# Patient Record
Sex: Female | Born: 1990 | Race: White | Hispanic: No | Marital: Single | Smoking: Former smoker
Health system: Southern US, Community
[De-identification: ages and names within clinical notes are randomized; demographics above are authoritative.]

## PROBLEM LIST (undated history)

## (undated) ENCOUNTER — Inpatient Hospital Stay (HOSPITAL_COMMUNITY): Payer: Self-pay

## (undated) DIAGNOSIS — J309 Allergic rhinitis, unspecified: Secondary | ICD-10-CM

## (undated) DIAGNOSIS — J45909 Unspecified asthma, uncomplicated: Secondary | ICD-10-CM

## (undated) DIAGNOSIS — O039 Complete or unspecified spontaneous abortion without complication: Secondary | ICD-10-CM

## (undated) DIAGNOSIS — F32A Depression, unspecified: Secondary | ICD-10-CM

## (undated) DIAGNOSIS — F419 Anxiety disorder, unspecified: Secondary | ICD-10-CM

## (undated) DIAGNOSIS — F329 Major depressive disorder, single episode, unspecified: Secondary | ICD-10-CM

## (undated) DIAGNOSIS — R569 Unspecified convulsions: Secondary | ICD-10-CM

## (undated) HISTORY — DX: Depression, unspecified: F32.A

## (undated) HISTORY — DX: Unspecified convulsions: R56.9

## (undated) HISTORY — DX: Major depressive disorder, single episode, unspecified: F32.9

## (undated) HISTORY — DX: Complete or unspecified spontaneous abortion without complication: O03.9

## (undated) HISTORY — DX: Anxiety disorder, unspecified: F41.9

## (undated) HISTORY — DX: Unspecified asthma, uncomplicated: J45.909

## (undated) HISTORY — PX: DILATION AND CURETTAGE OF UTERUS: SHX78

## (undated) HISTORY — DX: Allergic rhinitis, unspecified: J30.9

---
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
## 2012-09-11 ENCOUNTER — Ambulatory Visit (INDEPENDENT_AMBULATORY_CARE_PROVIDER_SITE_OTHER): Payer: PRIVATE HEALTH INSURANCE | Admitting: Emergency Medicine

## 2012-09-11 VITALS — BP 110/72 | HR 88 | Temp 98.3°F | Resp 18 | Ht 63.0 in | Wt 128.0 lb

## 2012-09-11 DIAGNOSIS — K047 Periapical abscess without sinus: Secondary | ICD-10-CM

## 2012-09-11 DIAGNOSIS — K0889 Other specified disorders of teeth and supporting structures: Secondary | ICD-10-CM

## 2012-09-11 DIAGNOSIS — K089 Disorder of teeth and supporting structures, unspecified: Secondary | ICD-10-CM

## 2012-09-11 MED ORDER — CLINDAMYCIN HCL 300 MG PO CAPS: 300.0000 mg | ORAL_CAPSULE | Freq: Three times a day (TID) | ORAL | Status: DC

## 2012-09-11 MED ORDER — MELOXICAM 7.5 MG PO TABS: ORAL_TABLET | ORAL | Status: DC

## 2012-09-11 NOTE — Progress Notes (Signed)
  Subjective:    Patient ID: Stephanie Yu, female    DOB: 08-Aug-1990, 22 y.o.   MRN: [id number]  HPI 22 year old female presents with toothache right sided pain and swelling-back lower molar Left sided pain and swelling where she chipped a tooth 6 months ago Still has wisdom teeth.     Review of Systems     Objective:   Physical Exam patient appears to have significant pain in her mouth. The left lower second molar is broken off at the gumline with swelling of the dome the right second lower premolar is broken off at the gum with significant swelling. There is no significant cervical adenopathy.        Assessment & Plan:  We'll treat with Cleocin Mobic for pain she was given the number of Dr. Talbert Cage to see if he can get her worked in or she is going to try a different dentist to see her as soon as possible.

## 2012-09-11 NOTE — Patient Instructions (Signed)
Toothache  Toothaches are usually caused by tooth decay (cavity). However, other causes of toothache include:  · Gum disease.  · Cracked tooth.  · Cracked filling.  · Injury.  · Jaw problem (temporo mandibular joint or TMJ disorder).  · Tooth abscess.  · Root sensitivity.  · Grinding.  · Eruption problems.  Swelling and redness around a painful tooth often means you have a dental abscess.  Pain medicine and antibiotics can help reduce symptoms, but you will need to see a dentist within the next few days to have your problem properly evaluated and treated. If tooth decay is the problem, you may need a filling or root canal to save your tooth. If the problem is more severe, your tooth may need to be pulled.  SEEK IMMEDIATE MEDICAL CARE IF:  · You cannot swallow.  · You develop severe swelling, increased redness, or increased pain in your mouth or face.  · You have a fever.  · You cannot open your mouth adequately.  Document Released: 03/18/2004 Document Revised: 05/03/2011 Document Reviewed: 05/08/2009  ExitCare® Patient Information ©2014 [employer].

## 2014-01-23 ENCOUNTER — Emergency Department (HOSPITAL_BASED_OUTPATIENT_CLINIC_OR_DEPARTMENT_OTHER): Payer: No Typology Code available for payment source

## 2014-01-23 ENCOUNTER — Encounter (HOSPITAL_BASED_OUTPATIENT_CLINIC_OR_DEPARTMENT_OTHER): Payer: Self-pay | Admitting: *Deleted

## 2014-01-23 ENCOUNTER — Emergency Department (HOSPITAL_BASED_OUTPATIENT_CLINIC_OR_DEPARTMENT_OTHER)
Admission: EM | Admit: 2014-01-23 | Discharge: 2014-01-23 | Disposition: A | Discharge status: Home / Self Care | Payer: No Typology Code available for payment source | Attending: Emergency Medicine | Admitting: Emergency Medicine

## 2014-01-23 DIAGNOSIS — R059 Cough, unspecified: Secondary | ICD-10-CM

## 2014-01-23 DIAGNOSIS — Z792 Long term (current) use of antibiotics: Secondary | ICD-10-CM | POA: Diagnosis not present

## 2014-01-23 DIAGNOSIS — R000 Tachycardia, unspecified: Secondary | ICD-10-CM | POA: Insufficient documentation

## 2014-01-23 DIAGNOSIS — J0110 Acute frontal sinusitis, unspecified: Secondary | ICD-10-CM | POA: Insufficient documentation

## 2014-01-23 DIAGNOSIS — J069 Acute upper respiratory infection, unspecified: Secondary | ICD-10-CM | POA: Diagnosis not present

## 2014-01-23 DIAGNOSIS — Z7952 Long term (current) use of systemic steroids: Secondary | ICD-10-CM | POA: Diagnosis not present

## 2014-01-23 DIAGNOSIS — Z79899 Other long term (current) drug therapy: Secondary | ICD-10-CM | POA: Insufficient documentation

## 2014-01-23 DIAGNOSIS — R05 Cough: Secondary | ICD-10-CM | POA: Diagnosis present

## 2014-01-23 DIAGNOSIS — M791 Myalgia: Secondary | ICD-10-CM | POA: Diagnosis not present

## 2014-01-23 DIAGNOSIS — F419 Anxiety disorder, unspecified: Secondary | ICD-10-CM | POA: Diagnosis not present

## 2014-01-23 IMAGING — CR DG CHEST 2V
2 series · 2 of 2 positions shown · non-contrast
Comparison: None.

CLINICAL DATA: Cough and chest congestion.  Initial encounter.

EXAM:
CHEST  2 VIEW

[w chest pa]
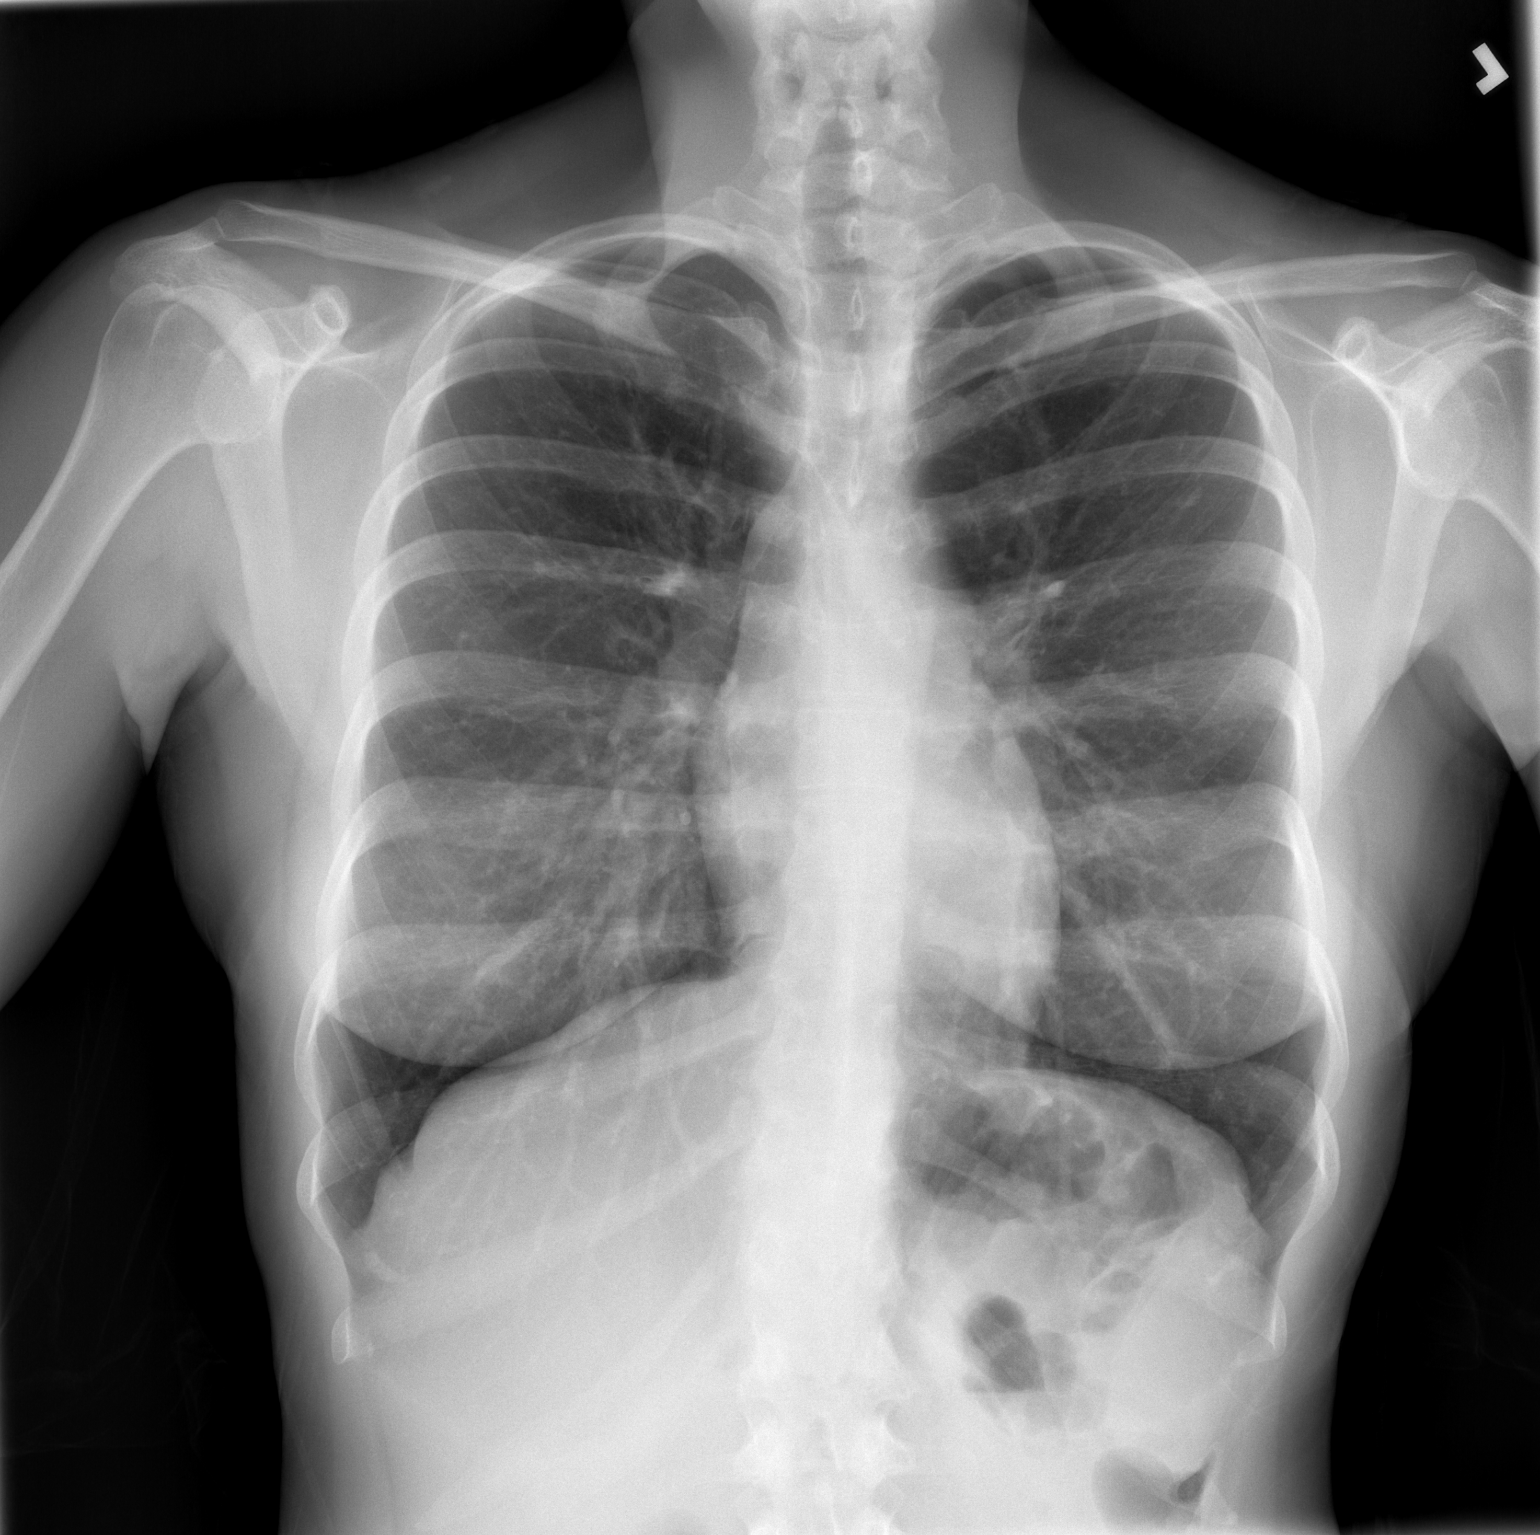

[w chest lat]
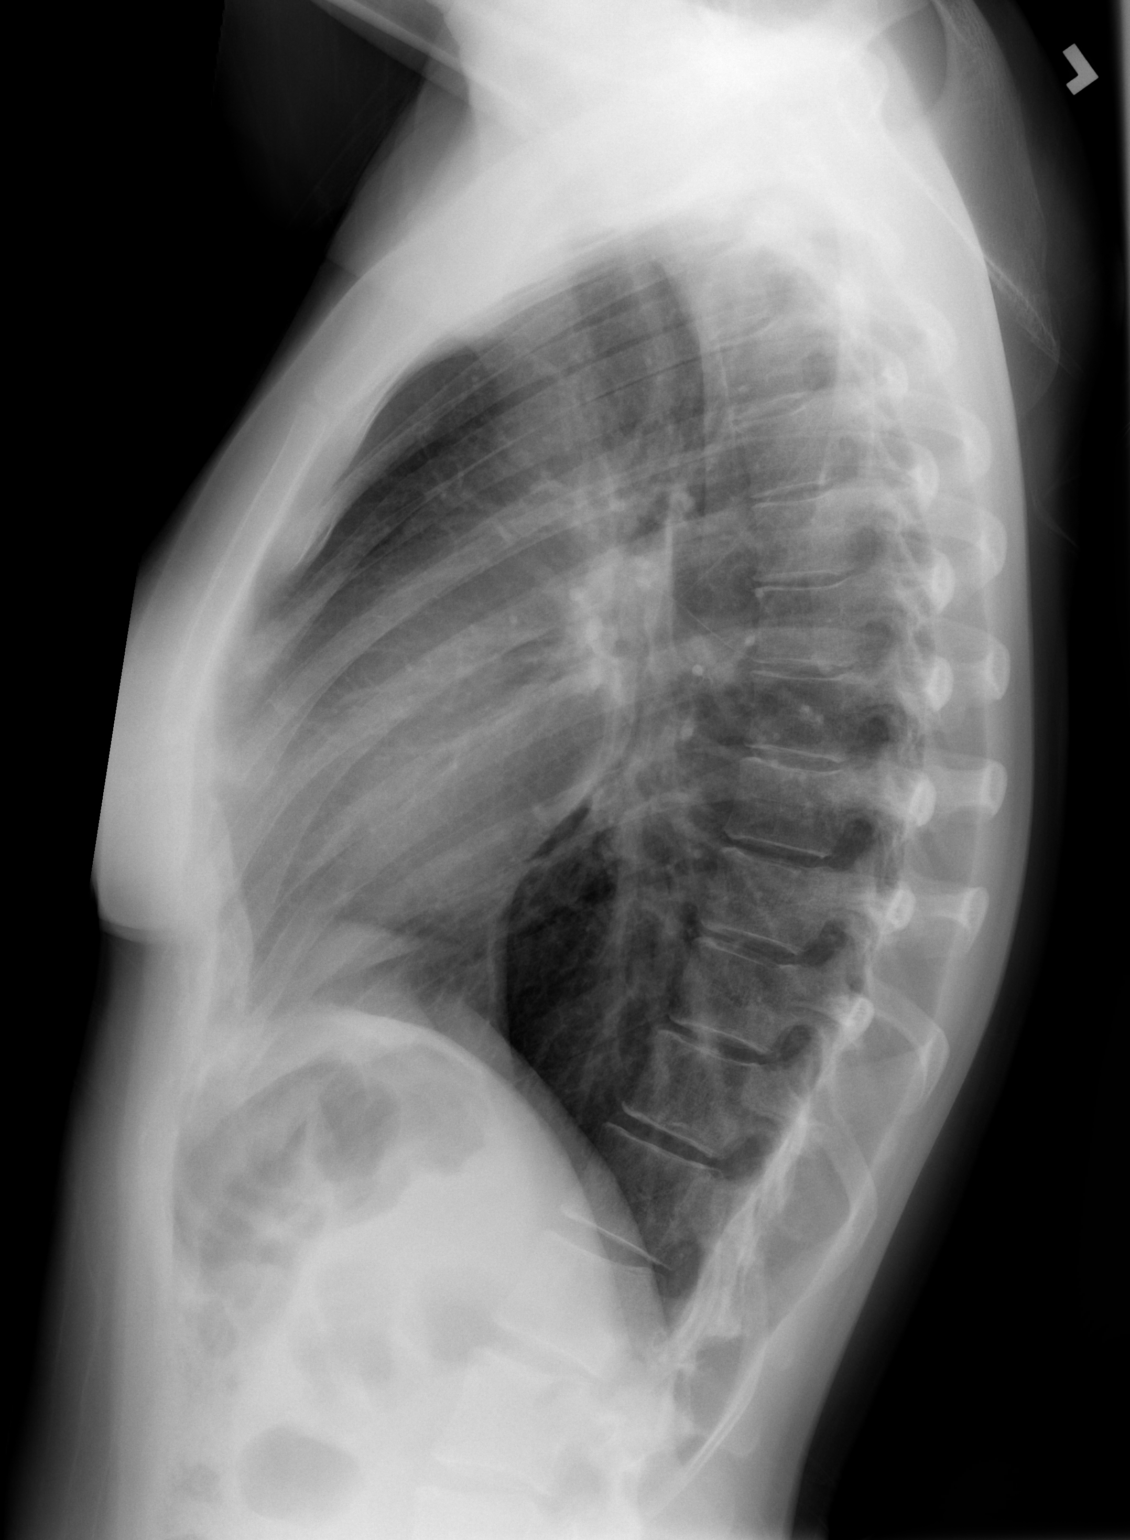

[2 of 2 positions shown; findings below may reference images not displayed]

FINDINGS: The cardiomediastinal silhouette is unremarkable.

There is no evidence of focal airspace disease, pulmonary edema,
suspicious pulmonary nodule/mass, pleural effusion, or pneumothorax.
No acute bony abnormalities are identified.
IMPRESSION: No active cardiopulmonary disease.

## 2014-01-23 MED ORDER — IBUPROFEN 400 MG PO TABS
600.0000 mg | ORAL_TABLET | Freq: Once | ORAL | Status: AC
  Administered 2014-01-23: 600 mg via ORAL
  Filled 2014-01-23 (×2): qty 1

## 2014-01-23 MED ORDER — IBUPROFEN 600 MG PO TABS: 600.0000 mg | ORAL_TABLET | Freq: Four times a day (QID) | ORAL | Status: DC | PRN

## 2014-01-23 MED ORDER — OXYMETAZOLINE HCL 0.05 % NA SOLN
1.0000 | Freq: Once | NASAL | Status: AC
  Administered 2014-01-23: 1 via NASAL
  Filled 2014-01-23: qty 15

## 2014-01-23 MED ORDER — LORATADINE 10 MG PO TABS: 10.0000 mg | ORAL_TABLET | Freq: Every day | ORAL | Status: DC

## 2014-01-23 MED ORDER — MOMETASONE FUROATE 50 MCG/ACT NA SUSP: 2.0000 | Freq: Every day | NASAL | Status: DC

## 2014-01-23 MED ORDER — LORATADINE 10 MG PO TABS
10.0000 mg | ORAL_TABLET | Freq: Once | ORAL | Status: AC
  Administered 2014-01-23: 10 mg via ORAL
  Filled 2014-01-23: qty 1

## 2014-01-23 MED ORDER — AZITHROMYCIN 250 MG PO TABS: ORAL_TABLET | ORAL | Status: DC

## 2014-01-23 NOTE — ED Notes (Signed)
Pt c/o URi symptoms x 4 days 

## 2014-01-23 NOTE — ED Provider Notes (Signed)
CSN: [id number]     Arrival date & time 01/23/14  0014 History   First MD Initiated Contact with Patient 01/23/14 0029     Chief Complaint  Patient presents with  . URI     (Consider location/radiation/quality/duration/timing/severity/associated sxs/prior Treatment) HPI Patient presents with 4 days of nasal congestion, sinus pressure, sore throat, nonproductive cough, subjective fevers and chills. She admits to diffuse myalgias. No known sick contacts. No neck pain or stiffness. No nausea, vomiting or diarrhea. Patient denies chest pain. Past Medical History  Diagnosis Date  . Anxiety    History reviewed. No pertinent past surgical history. Family History  Problem Relation Age of Onset  . Hypertension Mother   . Hypertension Father   . Diabetes Maternal Grandmother    History  Substance Use Topics  . Smoking status: Never Smoker   . Smokeless tobacco: Not on file  . Alcohol Use: No   OB History    No data available     Review of Systems  Constitutional: Positive for fever, chills and fatigue.  HENT: Positive for congestion, postnasal drip, rhinorrhea, sinus pressure, sore throat and voice change. Negative for trouble swallowing.   Eyes: Negative for visual disturbance.  Respiratory: Positive for cough. Negative for shortness of breath and wheezing.   Cardiovascular: Negative for chest pain, palpitations and leg swelling.  Gastrointestinal: Negative for nausea, vomiting, abdominal pain, diarrhea and constipation.  Musculoskeletal: Positive for myalgias. Negative for back pain, neck pain and neck stiffness.  Skin: Negative for rash and wound.  Neurological: Negative for dizziness, weakness, light-headedness, numbness and headaches.  All other systems reviewed and are negative.     Allergies  Augmentin  Home Medications   Prior to Admission medications   Medication Sig Start Date End Date Taking? Authorizing Provider  alprazolam Duanne Moron) 2 MG tablet Take 2 mg by  mouth 2 times daily at 12 noon and 4 pm.    Historical Provider, MD  azithromycin (ZITHROMAX Z-PAK) 250 MG tablet 2 po day one, then 1 daily x 4 days 01/23/14   Julianne Rice, MD  ibuprofen (ADVIL,MOTRIN) 600 MG tablet Take 1 tablet (600 mg total) by mouth every 6 (six) hours as needed. 01/23/14   Julianne Rice, MD  loratadine (CLARITIN) 10 MG tablet Take 1 tablet (10 mg total) by mouth daily. 01/23/14   Julianne Rice, MD  mometasone (NASONEX) 50 MCG/ACT nasal spray Place 2 sprays into the nose daily. 01/23/14   Julianne Rice, MD   BP 131/75 mmHg  Pulse 70  Temp(Src) 99.1 F (37.3 C) (Oral)  Resp 18  Ht 5\' 2"  (1.575 m)  Wt 121 lb (54.885 kg)  BMI 22.13 kg/m2  SpO2 99%  LMP 01/01/2014 Physical Exam  Constitutional: She is oriented to person, place, and time. She appears well-developed and well-nourished. No distress.  HENT:  Head: Normocephalic and atraumatic.  Mouth/Throat: Oropharynx is clear and moist. No oropharyngeal exudate.  Tenderness to percussion over the frontal and maxillary sinuses bilaterally. Patient has bilateral nasal mucosal edema. Mildly erythematous oropharynx without exudates. Uvula is midline.  Eyes: EOM are normal. Pupils are equal, round, and reactive to light.  Neck: Normal range of motion. Neck supple. No JVD present. No tracheal deviation present. No thyromegaly present.  No meningismus  Cardiovascular: Regular rhythm.   Tachycardia  Pulmonary/Chest: Effort normal and breath sounds normal. No respiratory distress. She has no wheezes. She has no rales. She exhibits no tenderness.  Abdominal: Soft. Bowel sounds are normal. She exhibits no distension  and no mass. There is no tenderness. There is no rebound and no guarding.  Musculoskeletal: Normal range of motion. She exhibits no edema or tenderness.  Lymphadenopathy:    She has no cervical adenopathy.  Neurological: She is alert and oriented to person, place, and time.  Patient is alert and oriented x3  with clear, goal oriented speech. Patient has 5/5 motor in all extremities. Sensation is intact to light touch. Patient has a normal gait and walks without assistance.  Skin: Skin is warm and dry. No rash noted. No erythema.  Psychiatric: Her behavior is normal.  Anxious.  Nursing note and vitals reviewed.   ED Course  Procedures (including critical care time) Labs Review Labs Reviewed - No data to display  Imaging Review Dg Chest 2 View  01/23/2014   CLINICAL DATA:  Cough and chest congestion.  Initial encounter.  EXAM: CHEST  2 VIEW  COMPARISON:  None.  FINDINGS: The cardiomediastinal silhouette is unremarkable.  There is no evidence of focal airspace disease, pulmonary edema, suspicious pulmonary nodule/mass, pleural effusion, or pneumothorax. No acute bony abnormalities are identified.  IMPRESSION: No active cardiopulmonary disease.   Electronically Signed   By: Hassan Rowan M.D.   On: 01/23/2014 02:23     EKG Interpretation None      MDM   Final diagnoses:  Cough  URI (upper respiratory infection)  Acute frontal sinusitis, recurrence not specified    Clinically the patient has URI with probable sinusitis. She is tachycardic on exam but appears anxious. We'll get a chest x-ray to rule out pneumonia and reevaluate heart rate.    Julianne Rice, MD 01/23/14 [phone]

## 2014-01-23 NOTE — Discharge Instructions (Signed)
Sinusitis Sinusitis is redness, soreness, and inflammation of the paranasal sinuses. Paranasal sinuses are air pockets within the bones of your face (beneath the eyes, the middle of the forehead, or above the eyes). In healthy paranasal sinuses, mucus is able to drain out, and air is able to circulate through them by way of your nose. However, when your paranasal sinuses are inflamed, mucus and air can become trapped. This can allow bacteria and other germs to grow and cause infection. Sinusitis can develop quickly and last only a short time (acute) or continue over a long period (chronic). Sinusitis that lasts for more than 12 weeks is considered chronic.  CAUSES  Causes of sinusitis include:  Allergies.  Structural abnormalities, such as displacement of the cartilage that separates your nostrils (deviated septum), which can decrease the air flow through your nose and sinuses and affect sinus drainage.  Functional abnormalities, such as when the small hairs (cilia) that line your sinuses and help remove mucus do not work properly or are not present. SIGNS AND SYMPTOMS  Symptoms of acute and chronic sinusitis are the same. The primary symptoms are pain and pressure around the affected sinuses. Other symptoms include:  Upper toothache.  Earache.  Headache.  Bad breath.  Decreased sense of smell and taste.  A cough, which worsens when you are lying flat.  Fatigue.  Fever.  Thick drainage from your nose, which often is green and may contain pus (purulent).  Swelling and warmth over the affected sinuses. DIAGNOSIS  Your health care provider will perform a physical exam. During the exam, your health care provider may:  Look in your nose for signs of abnormal growths in your nostrils (nasal polyps).  Tap over the affected sinus to check for signs of infection.  View the inside of your sinuses (endoscopy) using an imaging device that has a light attached (endoscope). If your health  care provider suspects that you have chronic sinusitis, one or more of the following tests may be recommended:  Allergy tests.  Nasal culture. A sample of mucus is taken from your nose, sent to a lab, and screened for bacteria.  Nasal cytology. A sample of mucus is taken from your nose and examined by your health care provider to determine if your sinusitis is related to an allergy. TREATMENT  Most cases of acute sinusitis are related to a viral infection and will resolve on their own within 10 days. Sometimes medicines are prescribed to help relieve symptoms (pain medicine, decongestants, nasal steroid sprays, or saline sprays).  However, for sinusitis related to a bacterial infection, your health care provider will prescribe antibiotic medicines. These are medicines that will help kill the bacteria causing the infection.  Rarely, sinusitis is caused by a fungal infection. In theses cases, your health care provider will prescribe antifungal medicine. For some cases of chronic sinusitis, surgery is needed. Generally, these are cases in which sinusitis recurs more than 3 times per year, despite other treatments. HOME CARE INSTRUCTIONS   Drink plenty of water. Water helps thin the mucus so your sinuses can drain more easily.  Use a humidifier.  Inhale steam 3 to 4 times a day (for example, sit in the bathroom with the shower running).  Apply a warm, moist washcloth to your face 3 to 4 times a day, or as directed by your health care provider.  Use saline nasal sprays to help moisten and clean your sinuses.  Take medicines only as directed by your health care provider.    If you were prescribed either an antibiotic or antifungal medicine, finish it all even if you start to feel better. °SEEK IMMEDIATE MEDICAL CARE IF: °· You have increasing pain or severe headaches. °· You have nausea, vomiting, or drowsiness. °· You have swelling around your face. °· You have vision problems. °· You have a stiff  neck. °· You have difficulty breathing. °MAKE SURE YOU:  °· Understand these instructions. °· Will watch your condition. °· Will get help right away if you are not doing well or get worse. °Document Released: 02/08/2005 Document Revised: 06/25/2013 Document Reviewed: 02/23/2011 °ExitCare® Patient Information ©2015 [employer]. This information is not intended to replace advice given to you by your health care provider. Make sure you discuss any questions you have with your health care provider. ° °Upper Respiratory Infection, Adult °An upper respiratory infection (URI) is also sometimes known as the common cold. The upper respiratory tract includes the nose, sinuses, throat, trachea, and bronchi. Bronchi are the airways leading to the lungs. Most people improve within 1 week, but symptoms can last up to 2 weeks. A residual cough may last even longer.  °CAUSES °Many different viruses can infect the tissues lining the upper respiratory tract. The tissues become irritated and inflamed and often become very moist. Mucus production is also common. A cold is contagious. You can easily spread the virus to others by oral contact. This includes kissing, sharing a glass, coughing, or sneezing. Touching your mouth or nose and then touching a surface, which is then touched by another person, can also spread the virus. °SYMPTOMS  °Symptoms typically develop 1 to 3 days after you come in contact with a cold virus. Symptoms vary from person to person. They may include: °· Runny nose. °· Sneezing. °· Nasal congestion. °· Sinus irritation. °· Sore throat. °· Loss of voice (laryngitis). °· Cough. °· Fatigue. °· Muscle aches. °· Loss of appetite. °· Headache. °· Low-grade fever. °DIAGNOSIS  °You might diagnose your own cold based on familiar symptoms, since most people get a cold 2 to 3 times a year. Your caregiver can confirm this based on your exam. Most importantly, your caregiver can check that your symptoms are not due to  another disease such as strep throat, sinusitis, pneumonia, asthma, or epiglottitis. Blood tests, throat tests, and X-rays are not necessary to diagnose a common cold, but they may sometimes be helpful in excluding other more serious diseases. Your caregiver will decide if any further tests are required. °RISKS AND COMPLICATIONS  °You may be at risk for a more severe case of the common cold if you smoke cigarettes, have chronic heart disease (such as heart failure) or lung disease (such as asthma), or if you have a weakened immune system. The very young and very old are also at risk for more serious infections. Bacterial sinusitis, middle ear infections, and bacterial pneumonia can complicate the common cold. The common cold can worsen asthma and chronic obstructive pulmonary disease (COPD). Sometimes, these complications can require emergency medical care and may be life-threatening. °PREVENTION  °The best way to protect against getting a cold is to practice good hygiene. Avoid oral or hand contact with people with cold symptoms. Wash your hands often if contact occurs. There is no clear evidence that vitamin C, vitamin E, echinacea, or exercise reduces the chance of developing a cold. However, it is always recommended to get plenty of rest and practice good nutrition. °TREATMENT  °Treatment is directed at relieving symptoms. There is no   cure. Antibiotics are not effective, because the infection is caused by a virus, not by bacteria. Treatment may include: °· Increased fluid intake. Sports drinks offer valuable electrolytes, sugars, and fluids. °· Breathing heated mist or steam (vaporizer or shower). °· Eating chicken soup or other clear broths, and maintaining good nutrition. °· Getting plenty of rest. °· Using gargles or lozenges for comfort. °· Controlling fevers with ibuprofen or acetaminophen as directed by your caregiver. °· Increasing usage of your inhaler if you have asthma. °Zinc gel and zinc lozenges,  taken in the first 24 hours of the common cold, can shorten the duration and lessen the severity of symptoms. Pain medicines may help with fever, muscle aches, and throat pain. A variety of non-prescription medicines are available to treat congestion and runny nose. Your caregiver can make recommendations and may suggest nasal or lung inhalers for other symptoms.  °HOME CARE INSTRUCTIONS  °· Only take over-the-counter or prescription medicines for pain, discomfort, or fever as directed by your caregiver. °· Use a warm mist humidifier or inhale steam from a shower to increase air moisture. This may keep secretions moist and make it easier to breathe. °· Drink enough water and fluids to keep your urine clear or pale yellow. °· Rest as needed. °· Return to work when your temperature has returned to normal or as your caregiver advises. You may need to stay home longer to avoid infecting others. You can also use a face mask and careful hand washing to prevent spread of the virus. °SEEK MEDICAL CARE IF:  °· After the first few days, you feel you are getting worse rather than better. °· You need your caregiver's advice about medicines to control symptoms. °· You develop chills, worsening shortness of breath, or brown or red sputum. These may be signs of pneumonia. °· You develop yellow or brown nasal discharge or pain in the face, especially when you bend forward. These may be signs of sinusitis. °· You develop a fever, swollen neck glands, pain with swallowing, or white areas in the back of your throat. These may be signs of strep throat. °SEEK IMMEDIATE MEDICAL CARE IF:  °· You have a fever. °· You develop severe or persistent headache, ear pain, sinus pain, or chest pain. °· You develop wheezing, a prolonged cough, cough up blood, or have a change in your usual mucus (if you have chronic lung disease). °· You develop sore muscles or a stiff neck. °Document Released: 08/04/2000 Document Revised: 05/03/2011 Document  Reviewed: 05/16/2013 °ExitCare® Patient Information ©2015 [employer]. This information is not intended to replace advice given to you by your health care provider. Make sure you discuss any questions you have with your health care provider. ° °

## 2014-01-24 MED ORDER — PROMETHAZINE-DM 6.25-15 MG/5ML PO SYRP: 5.0000 mL | ORAL_SOLUTION | Freq: Four times a day (QID) | ORAL | Status: DC | PRN

## 2014-01-24 NOTE — ED Provider Notes (Signed)
Patient is called in to nursing for intractable coughing and need for a antitussives. I have reviewed the chart and do not identify a cause for not adding cough suppressant. Patient has been diagnosed with a URI. Patient had reported to nursing she is taken promethazine DM in the past without adverse reaction. A prescription was provided.  Charlesetta Shanks, MD 01/24/14 1710

## 2014-01-24 NOTE — ED Notes (Signed)
Patient's finance called to request cough medications for the patient.  States she was seen here last night and is not able to rest due to the coughing.  Chart reviewed by Dr. Dorothyann Gibbs.  Prescription for Promethazine DM received and taken to our OP pharmacy to have filled.  Patient's finance to pickup, [employer].

## 2015-09-08 ENCOUNTER — Inpatient Hospital Stay (HOSPITAL_COMMUNITY): Payer: Self-pay

## 2015-09-08 ENCOUNTER — Encounter (HOSPITAL_COMMUNITY): Payer: Self-pay | Admitting: *Deleted

## 2015-09-08 ENCOUNTER — Inpatient Hospital Stay (HOSPITAL_COMMUNITY): Payer: Self-pay | Admitting: Certified Registered Nurse Anesthetist

## 2015-09-08 ENCOUNTER — Encounter (HOSPITAL_COMMUNITY)
Admission: AD | Disposition: A | Discharge status: Home / Self Care | Payer: Self-pay | Source: Ambulatory Visit | Attending: Obstetrics and Gynecology

## 2015-09-08 ENCOUNTER — Ambulatory Visit (HOSPITAL_COMMUNITY)
Admission: AD | Admit: 2015-09-08 | Discharge: 2015-09-08 | Disposition: A | Discharge status: Home / Self Care | Payer: Self-pay | Source: Ambulatory Visit | Attending: Obstetrics and Gynecology | Admitting: Obstetrics and Gynecology

## 2015-09-08 DIAGNOSIS — O034 Incomplete spontaneous abortion without complication: Secondary | ICD-10-CM

## 2015-09-08 DIAGNOSIS — F17210 Nicotine dependence, cigarettes, uncomplicated: Secondary | ICD-10-CM | POA: Insufficient documentation

## 2015-09-08 DIAGNOSIS — J45909 Unspecified asthma, uncomplicated: Secondary | ICD-10-CM | POA: Insufficient documentation

## 2015-09-08 DIAGNOSIS — O074 Failed attempted termination of pregnancy without complication: Secondary | ICD-10-CM

## 2015-09-08 HISTORY — PX: DILATION AND EVACUATION: SHX1459

## 2015-09-08 LAB — URINALYSIS, ROUTINE W REFLEX MICROSCOPIC
Bilirubin Urine: NEGATIVE
GLUCOSE, UA: NEGATIVE mg/dL
Ketones, ur: NEGATIVE mg/dL
LEUKOCYTES UA: NEGATIVE
Nitrite: NEGATIVE
PROTEIN: NEGATIVE mg/dL
SPECIFIC GRAVITY, URINE: 1.01 (ref 1.005–1.030)
pH: 7 (ref 5.0–8.0)

## 2015-09-08 LAB — CBC WITH DIFFERENTIAL/PLATELET
BASOS ABS: 0 10*3/uL (ref 0.0–0.1)
Basophils Relative: 0 %
Eosinophils Absolute: 0.5 10*3/uL (ref 0.0–0.7)
Eosinophils Relative: 7 %
HEMATOCRIT: 35.7 % — AB (ref 36.0–46.0)
Hemoglobin: 12.2 g/dL (ref 12.0–15.0)
LYMPHS ABS: 1.4 10*3/uL (ref 0.7–4.0)
LYMPHS PCT: 21 %
MCH: 31.4 pg (ref 26.0–34.0)
MCHC: 34.2 g/dL (ref 30.0–36.0)
MCV: 91.8 fL (ref 78.0–100.0)
Monocytes Absolute: 0.4 10*3/uL (ref 0.1–1.0)
Monocytes Relative: 6 %
NEUTROS ABS: 4.4 10*3/uL (ref 1.7–7.7)
Neutrophils Relative %: 66 %
Platelets: 234 10*3/uL (ref 150–400)
RBC: 3.89 MIL/uL (ref 3.87–5.11)
RDW: 13.1 % (ref 11.5–15.5)
WBC: 6.6 10*3/uL (ref 4.0–10.5)

## 2015-09-08 LAB — TYPE AND SCREEN
ABO/RH(D): A POS
ANTIBODY SCREEN: NEGATIVE

## 2015-09-08 LAB — URINE MICROSCOPIC-ADD ON

## 2015-09-08 LAB — ABO/RH: ABO/RH(D): A POS

## 2015-09-08 LAB — POCT PREGNANCY, URINE: Preg Test, Ur: POSITIVE — AB

## 2015-09-08 IMAGING — US US PELVIS COMPLETE
1 series · 15 of 25 positions shown · non-contrast
Comparison: None in PACs

CLINICAL DATA: Therapeutic abortion on [REDACTED], evaluated 4 days
[facility] with continued pain and bleeding
with outside ultrasound revealing retained products of conception ;
patient undergoing evaluation for D and C.

EXAM:
TRANSABDOMINAL AND TRANSVAGINAL ULTRASOUND OF PELVIS
TECHNIQUE: Both transabdominal and transvaginal ultrasound examinations of the
pelvis were performed. Transabdominal technique was performed for
global imaging of the pelvis including uterus, ovaries, adnexal
regions, and pelvic cul-de-sac. It was necessary to proceed with
endovaginal exam following the transabdominal exam to visualize the
endometrium and ovaries.

[Series 1: us pelvis complete · 45 acquisitions, 15 frames shown]
[im 1/45]
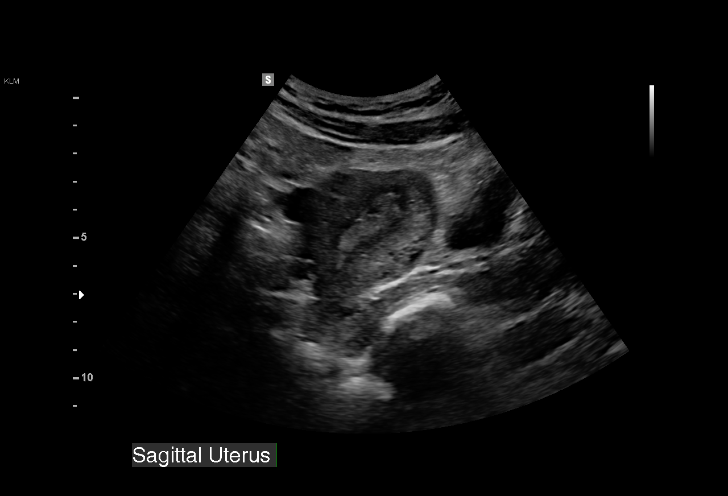
[im 4/45]
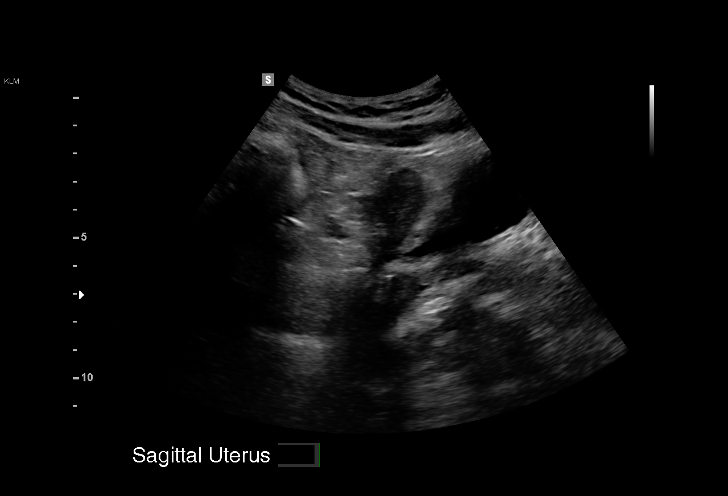
[im 8/45]
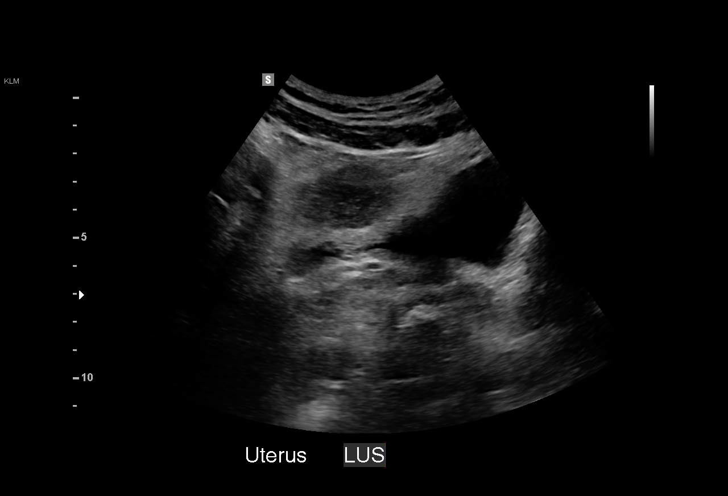
[im 10/45]
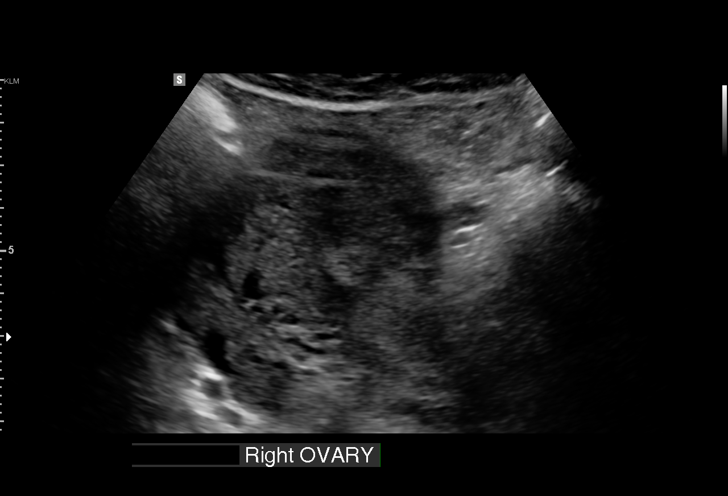
[im 13/45]
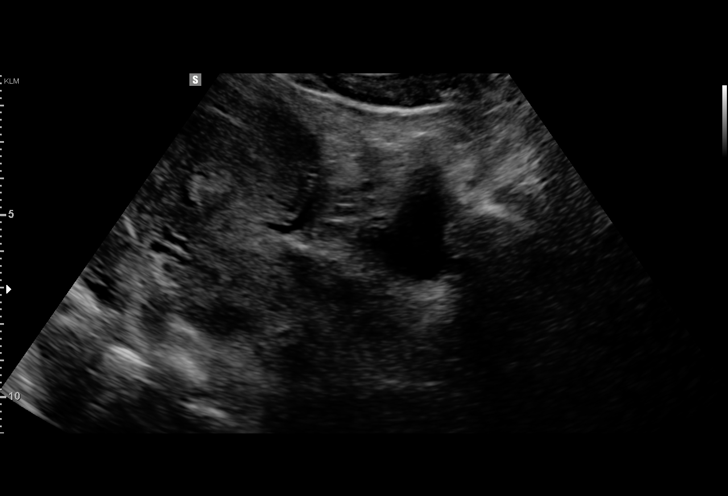
[im 17/45]
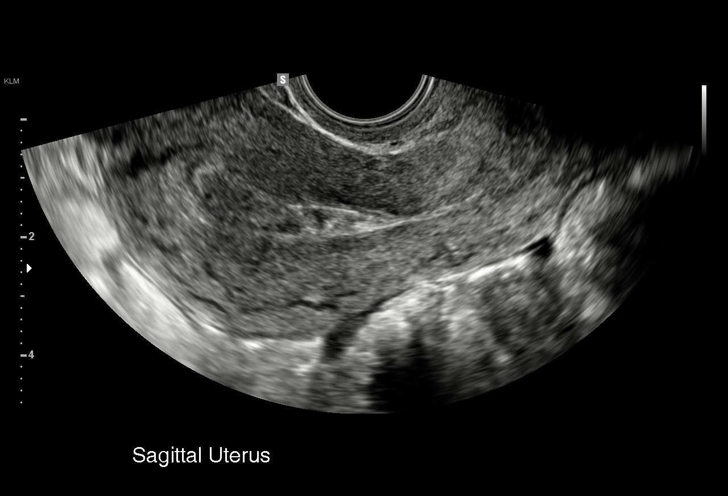
[im 19/45]
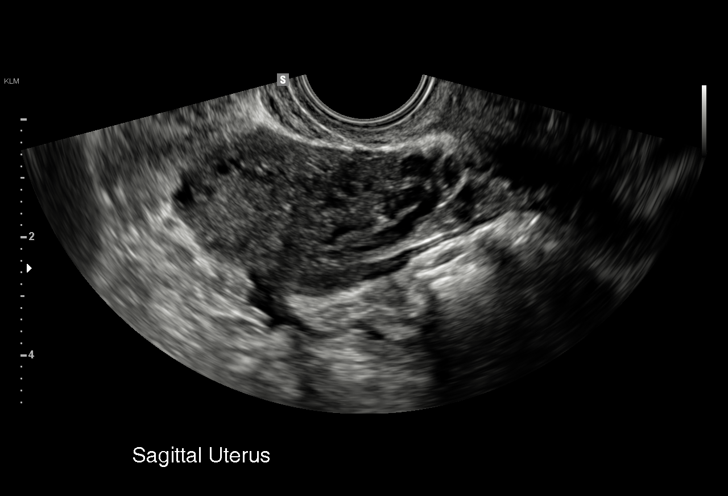
[im 23/45]
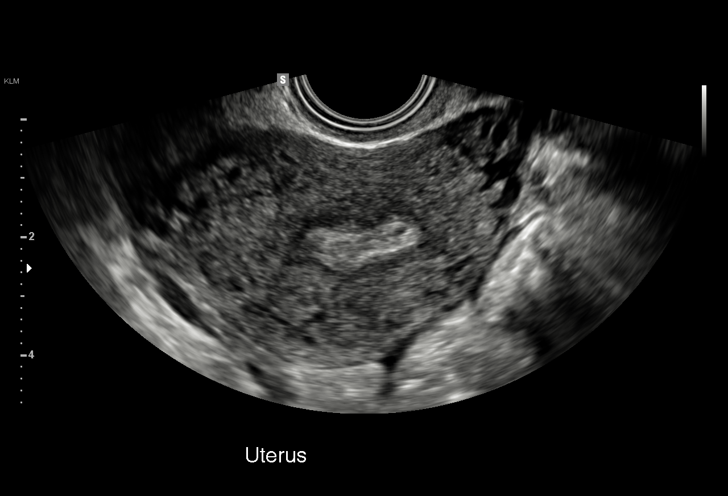
[im 26/45]
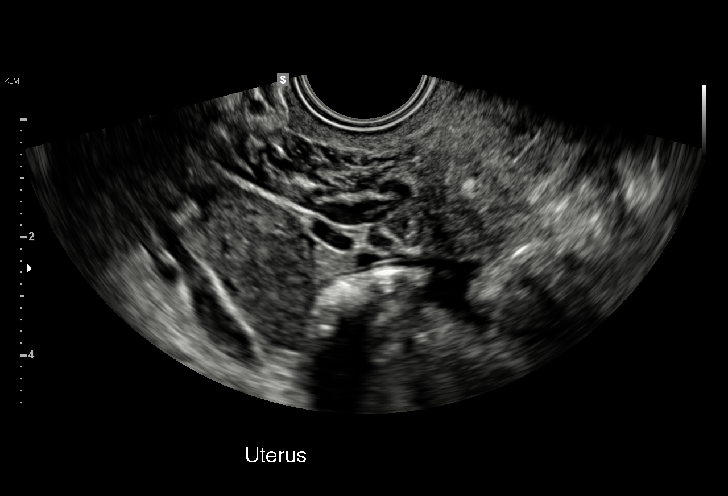
[im 28/45]
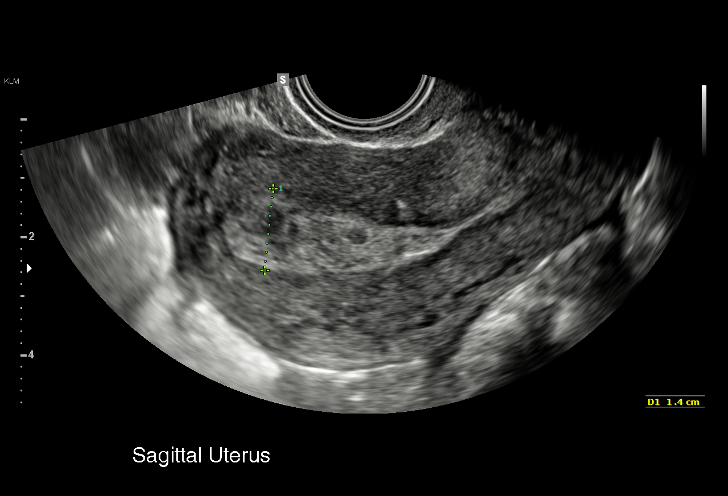
[im 32/45]
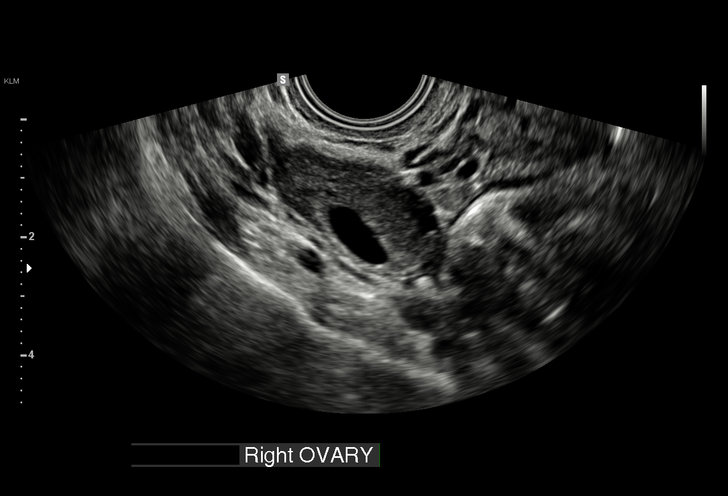
[im 35/45]
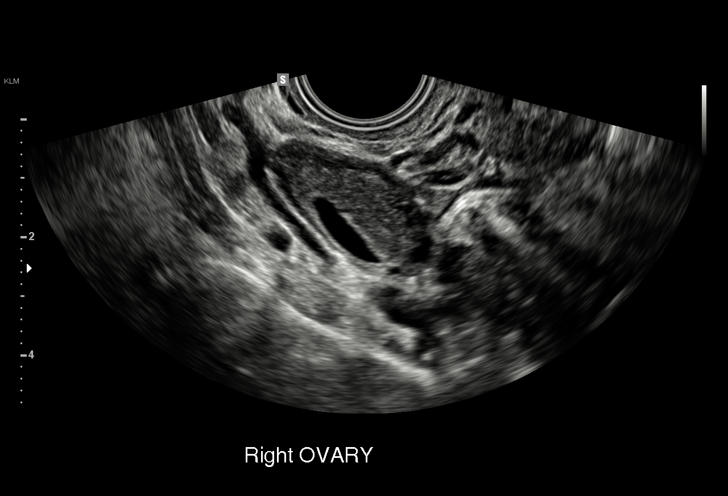
[im 37/45]
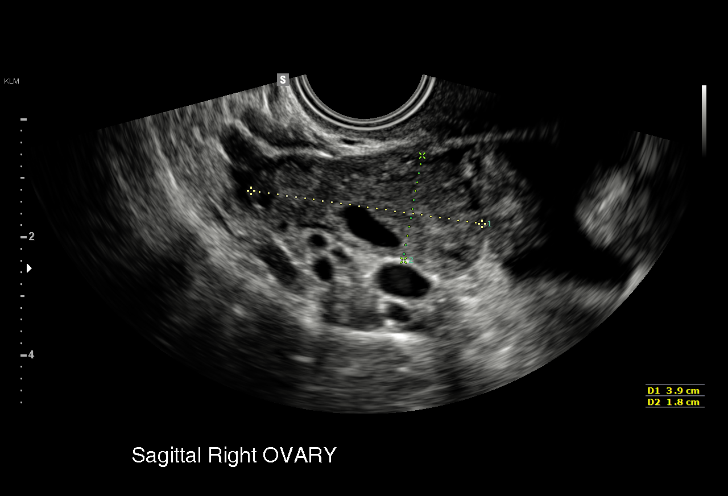
[im 41/45]
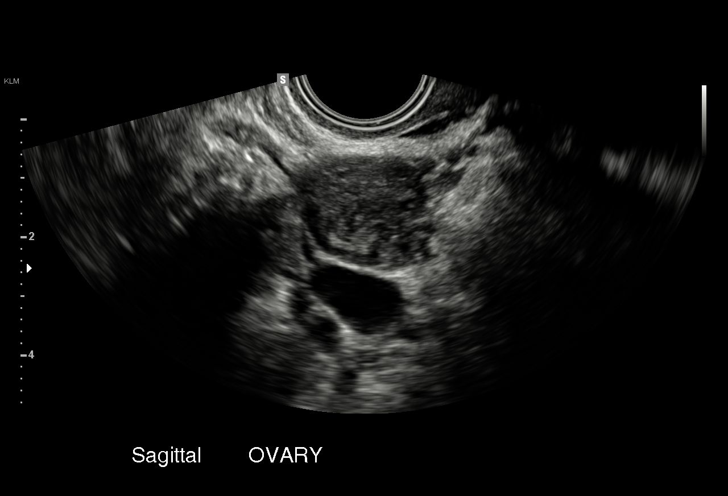
[im 45/45]
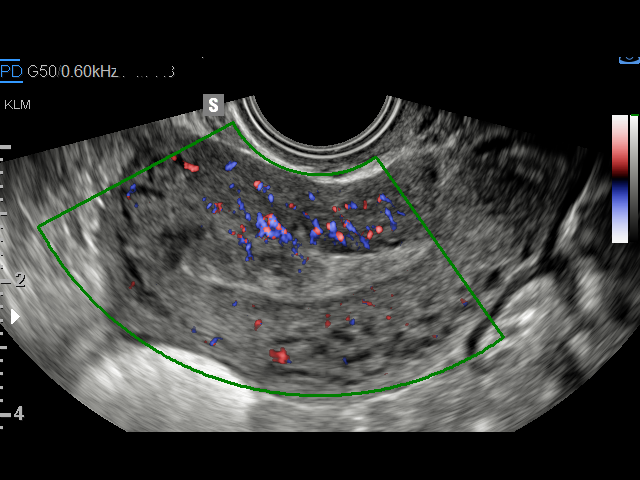

[15 of 25 positions shown; findings below may reference images not displayed]

FINDINGS: Uterus

Measurements: 8.3 x 4.7 x 5.8 cm.. The myometrial echotexture is
normal.

Endometrium

Thickness: 14 mm. Heterogeneous echotexture with increased
vascularity.

Right ovary

Measurements: 3.9 x 1.8 x 1.9 cm. Probable corpus luteum cyst
measuring up to 1 cm in diameter.

Left ovary

Measurements: 2.8 x 2.2 x 2.2 cm. Normal appearance/no adnexal mass.

Other findings

No free pelvic fluid.
IMPRESSION: 1. Thickened heterogeneous appearing endometrium with increased
vascularity. Findings are compatible with retained products of
conception in the correct clinical setting.
2. Normal appearance of the ovaries.  No free pelvic fluid.

## 2015-09-08 SURGERY — DILATION AND EVACUATION, UTERUS
Anesthesia: General | Site: Vagina

## 2015-09-08 MED ORDER — FENTANYL CITRATE (PF) 100 MCG/2ML IJ SOLN
25.0000 ug | INTRAMUSCULAR | Status: DC | PRN
  Administered 2015-09-08 (×2): 50 ug via INTRAVENOUS

## 2015-09-08 MED ORDER — ONDANSETRON HCL 4 MG/2ML IJ SOLN
INTRAMUSCULAR | Status: AC
  Filled 2015-09-08: qty 2

## 2015-09-08 MED ORDER — IBUPROFEN 600 MG PO TABS: 600.0000 mg | ORAL_TABLET | Freq: Four times a day (QID) | ORAL | Status: DC | PRN

## 2015-09-08 MED ORDER — LIDOCAINE HCL 1 % IJ SOLN
INTRAMUSCULAR | Status: DC | PRN
  Administered 2015-09-08: 20 mL

## 2015-09-08 MED ORDER — DEXAMETHASONE SODIUM PHOSPHATE 10 MG/ML IJ SOLN
INTRAMUSCULAR | Status: DC | PRN
  Administered 2015-09-08: 5 mg via INTRAVENOUS

## 2015-09-08 MED ORDER — SOD CITRATE-CITRIC ACID 500-334 MG/5ML PO SOLN
ORAL | Status: AC
  Filled 2015-09-08: qty 15

## 2015-09-08 MED ORDER — 0.9 % SODIUM CHLORIDE (POUR BTL) OPTIME
TOPICAL | Status: DC | PRN
  Administered 2015-09-08: 1000 mL

## 2015-09-08 MED ORDER — OXYCODONE-ACETAMINOPHEN 5-325 MG PO TABS: 1.0000 | ORAL_TABLET | Freq: Four times a day (QID) | ORAL | Status: DC | PRN

## 2015-09-08 MED ORDER — DOXYCYCLINE HYCLATE 100 MG IV SOLR
100.0000 mg | Freq: Once | INTRAVENOUS | Status: AC
  Administered 2015-09-08: 100 mg via INTRAVENOUS
  Filled 2015-09-08: qty 100

## 2015-09-08 MED ORDER — MIDAZOLAM HCL 2 MG/2ML IJ SOLN
INTRAMUSCULAR | Status: DC | PRN
  Administered 2015-09-08: 1 mg via INTRAVENOUS

## 2015-09-08 MED ORDER — FENTANYL CITRATE (PF) 100 MCG/2ML IJ SOLN
INTRAMUSCULAR | Status: AC
  Filled 2015-09-08: qty 2

## 2015-09-08 MED ORDER — FENTANYL CITRATE (PF) 100 MCG/2ML IJ SOLN
INTRAMUSCULAR | Status: DC | PRN
  Administered 2015-09-08 (×4): 25 ug via INTRAVENOUS
  Administered 2015-09-08 (×2): 50 ug via INTRAVENOUS

## 2015-09-08 MED ORDER — PROPOFOL 10 MG/ML IV BOLUS
INTRAVENOUS | Status: DC | PRN
  Administered 2015-09-08: 170 mg via INTRAVENOUS

## 2015-09-08 MED ORDER — LIDOCAINE HCL (CARDIAC) 20 MG/ML IV SOLN
INTRAVENOUS | Status: DC | PRN
  Administered 2015-09-08: 70 mg via INTRATRACHEAL

## 2015-09-08 MED ORDER — PROPOFOL 10 MG/ML IV BOLUS
INTRAVENOUS | Status: AC
  Filled 2015-09-08: qty 20

## 2015-09-08 MED ORDER — SODIUM CHLORIDE 0.9 % IV SOLN: INTRAVENOUS | Status: DC

## 2015-09-08 MED ORDER — KETOROLAC TROMETHAMINE 30 MG/ML IJ SOLN
INTRAMUSCULAR | Status: DC | PRN
  Administered 2015-09-08: 30 mg via INTRAVENOUS

## 2015-09-08 MED ORDER — ONDANSETRON HCL 4 MG/2ML IJ SOLN
INTRAMUSCULAR | Status: DC | PRN
  Administered 2015-09-08: 4 mg via INTRAVENOUS

## 2015-09-08 MED ORDER — LACTATED RINGERS IV SOLN
INTRAVENOUS | Status: DC | PRN
  Administered 2015-09-08: 12:00:00 via INTRAVENOUS

## 2015-09-08 MED ORDER — KETOROLAC TROMETHAMINE 30 MG/ML IJ SOLN
INTRAMUSCULAR | Status: AC
  Filled 2015-09-08: qty 1

## 2015-09-08 MED ORDER — MIDAZOLAM HCL 2 MG/2ML IJ SOLN
INTRAMUSCULAR | Status: AC
  Filled 2015-09-08: qty 2

## 2015-09-08 MED ORDER — DEXAMETHASONE SODIUM PHOSPHATE 4 MG/ML IJ SOLN
INTRAMUSCULAR | Status: AC
Start: 2015-09-08 — End: 2015-09-08
  Filled 2015-09-08: qty 1

## 2015-09-08 MED ORDER — LIDOCAINE HCL (CARDIAC) 20 MG/ML IV SOLN
INTRAVENOUS | Status: AC
  Filled 2015-09-08: qty 5

## 2015-09-08 MED ORDER — HYDROMORPHONE HCL 1 MG/ML IJ SOLN
1.0000 mg | Freq: Once | INTRAMUSCULAR | Status: AC
  Administered 2015-09-08: 1 mg via INTRAMUSCULAR
  Filled 2015-09-08: qty 1

## 2015-09-08 MED ORDER — SOD CITRATE-CITRIC ACID 500-334 MG/5ML PO SOLN
30.0000 mL | Freq: Once | ORAL | Status: AC
  Administered 2015-09-08: 30 mL via ORAL

## 2015-09-08 SURGICAL SUPPLY — 20 items
CATH ROBINSON RED A/P 16FR (CATHETERS) ×3 IMPLANT
CLOTH BEACON ORANGE TIMEOUT ST (SAFETY) ×3 IMPLANT
DECANTER SPIKE VIAL GLASS SM (MISCELLANEOUS) ×3 IMPLANT
GLOVE BIO SURGEON STRL SZ7 (GLOVE) ×3 IMPLANT
GLOVE BIOGEL PI IND STRL 7.0 (GLOVE) ×1 IMPLANT
GLOVE BIOGEL PI INDICATOR 7.0 (GLOVE) ×2
GOWN STRL REUS W/TWL LRG LVL3 (GOWN DISPOSABLE) ×6 IMPLANT
KIT BERKELEY 1ST TRIMESTER 3/8 (MISCELLANEOUS) ×3 IMPLANT
NEEDLE SPNL 22GX3.5 QUINCKE BK (NEEDLE) ×3 IMPLANT
NS IRRIG 1000ML POUR BTL (IV SOLUTION) ×3 IMPLANT
PACK VAGINAL MINOR WOMEN LF (CUSTOM PROCEDURE TRAY) ×3 IMPLANT
PAD OB MATERNITY 4.3X12.25 (PERSONAL CARE ITEMS) ×3 IMPLANT
PAD PREP 24X48 CUFFED NSTRL (MISCELLANEOUS) ×3 IMPLANT
SET BERKELEY SUCTION TUBING (SUCTIONS) ×3 IMPLANT
TOWEL OR 17X24 6PK STRL BLUE (TOWEL DISPOSABLE) ×6 IMPLANT
TOWEL OR 17X26 4PK STRL BLUE (TOWEL DISPOSABLE) ×3 IMPLANT
VACURETTE 10 RIGID CVD (CANNULA) ×3 IMPLANT
VACURETTE 7MM CVD STRL WRAP (CANNULA) IMPLANT
VACURETTE 8 RIGID CVD (CANNULA) ×3 IMPLANT
VACURETTE 9 RIGID CVD (CANNULA) IMPLANT

## 2015-09-08 NOTE — MAU Provider Note (Signed)
Chief Complaint:  Vaginal Bleeding; Abdominal Pain; and Post-op Problem   First Provider Initiated Contact with Patient 09/08/15 0919     HPI: Stephanie Yu is a 25 y.o. [id number] who presents to maternity admissions reporting pelvic pain, cramping and bleeding.  Had an elective abortion and [facility] on [address].  States was given something IV "but I was awake the whole time and the pain meds did not work. They turned the [place] screen to me and I had to watch the whole thing".  Went to [facility] ED in [place] Friday for pain and passage of tissue. Had an [place] and was told she had retained POC, and they gave her a number to call to schedule a D&C.  Came here instead. Used all 10 Percocet they gave her Friday She reports vaginal bleeding, vaginal itching/burning, urinary symptoms, h/a, dizziness, n/v, or fever/chills.    Abdominal Cramping This is a recurrent problem. The current episode started in the past 7 days. The problem occurs constantly. The problem has been unchanged. The pain is located in the suprapubic region, LLQ and RLQ. The pain is severe. The quality of the pain is cramping and sharp. The abdominal pain does not radiate. Pertinent negatives include no constipation, diarrhea, dysuria, fever, myalgias, nausea or vomiting. Nothing aggravates the pain. The pain is relieved by nothing. She has tried oral narcotic analgesics for the symptoms. The treatment provided no relief.   RN Note: Last Monday, had an abortion. Went to [facility] on Friday because of continued pain, had been bleeding all along, passed little pieces of ? Tissue. Had [place]- was told Retained POC, probable cause of pain. Was to be referred- which did not happen. Pt wanted to come here. Was given Tramadol, and instructed to f/u as she needed a D&C         Past Medical History: Past Medical History  Diagnosis Date  . Medical history non-contributory     Past obstetric history: OB History  Gravida Para  Term Preterm AB SAB TAB Ectopic Multiple Living  2 0   2 1 1    0    # Outcome Date GA Lbr Len/2nd Weight Sex Delivery Anes PTL Lv  2 SAB           1 TAB               Past Surgical History: Past Surgical History  Procedure Laterality Date  . Dilation and curettage of uterus      Family History: History reviewed. No pertinent family history.  Social History: Social History  Substance Use Topics  . Smoking status: Current Every Day Smoker -- 0.25 packs/day for 10 years    Types: Cigarettes  . Smokeless tobacco: Never Used  . Alcohol Use: No    Allergies:  Allergies  Allergen Reactions  . Amoxicillin     rash  . Penicillins     Has patient had a PCN reaction causing immediate rash, facial/tongue/throat swelling, SOB or lightheadedness with hypotension: No Has patient had a PCN reaction causing severe rash involving mucus membranes or skin necrosis: yes Has patient had a PCN reaction that required hospitalization No Has patient had a PCN reaction occurring within the last 10 years: No If all of the above answers are "NO", then may proceed with Cephalosporin use.   . Vicodin [Hydrocodone-Acetaminophen] Itching    Nausea and vomiting    Meds:  Prescriptions prior to admission  Medication Sig Dispense Refill Last Dose  .  ALPRAZolam (XANAX) 1 MG tablet Take 1 mg by mouth at bedtime as needed for anxiety.   09/08/2015 at Unknown time  . cephALEXin (KEFLEX) 500 MG capsule Take 500 mg by mouth 4 (four) times daily.   09/08/2015 at Unknown time  . ibuprofen (ADVIL,MOTRIN) 800 MG tablet Take 800 mg by mouth every 8 (eight) hours as needed for moderate pain.   09/07/2015 at Unknown time  . oxyCODONE-acetaminophen (PERCOCET/ROXICET) 5-325 MG tablet Take 1 tablet by mouth every 4 (four) hours as needed for severe pain.   09/07/2015 at Unknown time    I have reviewed patient's Past Medical Hx, Surgical Hx, Family Hx, Social Hx, medications and allergies.  ROS:  Review of Systems   Constitutional: Negative for fever and chills.  Respiratory: Negative for shortness of breath.   Gastrointestinal: Positive for abdominal pain. Negative for nausea, vomiting, diarrhea and constipation.  Genitourinary: Positive for vaginal bleeding and pelvic pain. Negative for dysuria, flank pain, vaginal discharge and vaginal pain.  Musculoskeletal: Negative for myalgias.  Neurological: Negative for dizziness.   Other systems negative     Physical Exam  Patient Vitals for the past 24 hrs:  BP Temp Temp src Pulse Resp Height Weight  09/08/15 0854 110/84 mmHg 98.5 F (36.9 C) Oral 109 20 5' 1.25" (1.556 m) 68.221 kg (150 lb 6.4 oz)   Constitutional: Well-developed, well-nourished female in no acute distress.  Cardiovascular: normal rate and rhythm, no ectopy audible, S1 & S2 heard, no murmur Respiratory: normal effort, no distress. Lungs CTAB with no wheezes or crackles GI: Abd soft, non-tender.  Nondistended.  No rebound, No guarding.  Bowel Sounds audible  MS: Extremities nontender, no edema, normal ROM Neurologic: Alert and oriented x 4.   Grossly nonfocal. GU: Neg CVAT. Skin:  Warm and Dry Psych:  Affect appropriate.  PELVIC EXAM: Cervix closed, without lesion, small bloody discharge     Labs: Results for orders placed or performed during the hospital encounter of 09/08/15 (from the past 24 hour(s))  CBC with Differential     Status: Abnormal   Collection Time: 09/08/15  9:58 AM  Result Value Ref Range   WBC 6.6 4.0 - 10.5 K/uL   RBC 3.89 3.87 - 5.11 MIL/uL   Hemoglobin 12.2 12.0 - 15.0 g/dL   HCT 35.7 (L) 36.0 - 46.0 %   MCV 91.8 78.0 - 100.0 fL   MCH 31.4 26.0 - 34.0 pg   MCHC 34.2 30.0 - 36.0 g/dL   RDW 13.1 11.5 - 15.5 %   Platelets 234 150 - 400 K/uL   Neutrophils Relative % 66 %   Neutro Abs 4.4 1.7 - 7.7 K/uL   Lymphocytes Relative 21 %   Lymphs Abs 1.4 0.7 - 4.0 K/uL   Monocytes Relative 6 %   Monocytes Absolute 0.4 0.1 - 1.0 K/uL   Eosinophils Relative  7 %   Eosinophils Absolute 0.5 0.0 - 0.7 K/uL   Basophils Relative 0 %   Basophils Absolute 0.0 0.0 - 0.1 K/uL  Type and screen [place]     Status: None   Collection Time: 09/08/15  9:58 AM  Result Value Ref Range   ABO/RH(D) A POS    Antibody Screen NEG    Sample Expiration 09/11/2015   Pregnancy, urine POC     Status: Abnormal   Collection Time: 09/08/15 10:49 AM  Result Value Ref Range   Preg Test, Ur POSITIVE (A) NEGATIVE   --/--/A POS (07/17 [id number])  Imaging:  US Transvaginal Non-ob  09/08/2015  CLINICAL DATA:  Therapeutic abortion on July 10th, evaluated 4 days later at 8 no block [place] with continued pain and bleeding with outside ultrasound revealing retained products of conception ; patient undergoing evaluation for D and C. EXAM: TRANSABDOMINAL AND TRANSVAGINAL ULTRASOUND OF PELVIS TECHNIQUE: Both transabdominal and transvaginal ultrasound examinations of the pelvis were performed. Transabdominal technique was performed for global imaging of the pelvis including uterus, ovaries, adnexal regions, and pelvic cul-de-sac. It was necessary to proceed with endovaginal exam following the transabdominal exam to visualize the endometrium and ovaries. COMPARISON:  None in PACs FINDINGS: Uterus Measurements: 8.3 x 4.7 x 5.8 cm. The myometrial echotexture is normal. Endometrium Thickness: 14 mm. Heterogeneous echotexture with increased vascularity. Right ovary Measurements: 3.9 x 1.8 x 1.9 cm. Probable corpus luteum cyst measuring up to 1 cm in diameter. Left ovary Measurements: 2.8 x 2.2 x 2.2 cm. Normal appearance/no adnexal mass. Other findings No free pelvic fluid. IMPRESSION: 1. Thickened heterogeneous appearing endometrium with increased vascularity. Findings are compatible with retained products of conception in the correct clinical setting. 2. Normal appearance of the ovaries.  No free pelvic fluid. Electronically Signed   By: David  Martinique M.D.   On:  09/08/2015 11:00   US Pelvis Complete  09/08/2015  CLINICAL DATA:  Therapeutic abortion on July 10th, evaluated 4 days later at 8 no block [place] with continued pain and bleeding with outside ultrasound revealing retained products of conception ; patient undergoing evaluation for D and C. EXAM: TRANSABDOMINAL AND TRANSVAGINAL ULTRASOUND OF PELVIS TECHNIQUE: Both transabdominal and transvaginal ultrasound examinations of the pelvis were performed. Transabdominal technique was performed for global imaging of the pelvis including uterus, ovaries, adnexal regions, and pelvic cul-de-sac. It was necessary to proceed with endovaginal exam following the transabdominal exam to visualize the endometrium and ovaries. COMPARISON:  None in PACs FINDINGS: Uterus Measurements: 8.3 x 4.7 x 5.8 cm. The myometrial echotexture is normal. Endometrium Thickness: 14 mm. Heterogeneous echotexture with increased vascularity. Right ovary Measurements: 3.9 x 1.8 x 1.9 cm. Probable corpus luteum cyst measuring up to 1 cm in diameter. Left ovary Measurements: 2.8 x 2.2 x 2.2 cm. Normal appearance/no adnexal mass. Other findings No free pelvic fluid. IMPRESSION: 1. Thickened heterogeneous appearing endometrium with increased vascularity. Findings are compatible with retained products of conception in the correct clinical setting. 2. Normal appearance of the ovaries.  No free pelvic fluid. Electronically Signed   By: David  Martinique M.D.   On: 09/08/2015 11:00    MAU Course/MDM: I have ordered labs as follows: CBC, Type and screen Imaging ordered: Pelvic ultrasound Results reviewed.   Consult Dr Ilda Basset.   Treatments in MAU included Dilaudid analgesia.   Pt stable at time of discharge.  Assessment: 1. Retained products of conception following abortion     Plan: Plan D&C today    Medication List    ASK your doctor about these medications        ALPRAZolam 1 MG tablet  Commonly known as:  XANAX  Take 1 mg by  mouth at bedtime as needed for anxiety.     cephALEXin 500 MG capsule  Commonly known as:  KEFLEX  Take 500 mg by mouth 4 (four) times daily.     ibuprofen 800 MG tablet  Commonly known as:  ADVIL,MOTRIN  Take 800 mg by mouth every 8 (eight) hours as needed for moderate pain.     oxyCODONE-acetaminophen 5-325 MG tablet  Commonly known  as:  PERCOCET/ROXICET  Take 1 tablet by mouth every 4 (four) hours as needed for severe pain.       Encouraged to return here or to other Urgent Care/ED if she develops worsening of symptoms, increase in pain, fever, or other concerning symptoms.   Hansel Feinstein CNM, MSN Certified Nurse-Midwife 09/08/2015 11:10 AM

## 2015-09-08 NOTE — Anesthesia Preprocedure Evaluation (Signed)
Anesthesia Evaluation  Patient identified by MRN, date of birth, ID band Patient awake    Reviewed: Allergy & Precautions, H&P , Patient's Chart, lab work & pertinent test results, reviewed documented beta blocker date and time   Airway Mallampati: II  TM Distance: >3 FB Neck ROM: full    Dental no notable dental hx.    Pulmonary asthma , Current Smoker,    Pulmonary exam normal breath sounds clear to auscultation       Cardiovascular  Rhythm:regular Rate:Normal     Neuro/Psych    GI/Hepatic   Endo/Other    Renal/GU      Musculoskeletal   Abdominal   Peds  Hematology   Anesthesia Other Findings No wheezes  Reproductive/Obstetrics                             Anesthesia Physical Anesthesia Plan  ASA: II  Anesthesia Plan:    Post-op Pain Management:    Induction: Intravenous  Airway Management Planned: LMA  Additional Equipment:   Intra-op Plan:   Post-operative Plan:   Informed Consent: I have reviewed the patients History and Physical, chart, labs and discussed the procedure including the risks, benefits and alternatives for the proposed anesthesia with the patient or authorized representative who has indicated his/her understanding and acceptance.   Dental Advisory Given and Dental advisory given  Plan Discussed with: CRNA and Surgeon  Anesthesia Plan Comments: (Discussed GA with LMA, possible sore throat, potential need to switch to ETT, N/V, pulmonary aspiration. Questions answered. )        Anesthesia Quick Evaluation

## 2015-09-08 NOTE — MAU Note (Signed)
Unable to void on admission.

## 2015-09-08 NOTE — Transfer of Care (Signed)
Immediate Anesthesia Transfer of Care Note  Patient: Stephanie Yu  Procedure(s) Performed: Procedure(s): DILATATION AND EVACUATION (N/A)  Patient Location: PACU  Anesthesia Type:General  Level of Consciousness: awake, alert , oriented and patient cooperative  Airway & Oxygen Therapy: Patient Spontanous Breathing and Patient connected to nasal cannula oxygen  Post-op Assessment: Report given to RN and Post -op Vital signs reviewed and stable  Post vital signs: Reviewed and stable  Last Vitals:  Filed Vitals:   09/08/15 0854  BP: 110/84  Pulse: 109  Temp: 36.9 C  Resp: 20    Last Pain:  Filed Vitals:   09/08/15 0950  PainSc: 9          Complications: No apparent anesthesia complications

## 2015-09-08 NOTE — Op Note (Addendum)
Operative Note   09/08/15  PRE-OP DIAGNOSIS: Retained products of conception   POST-OP DIAGNOSIS: Retained products of conception  SURGEON: Aletha Halim, MD  ASSISTANT: Ree Shay Mumaw, DO  PROCEDURE:  Suction dilation and curettage  ANESTHESIA: Monitor Anesthesia Care and paracervical block  ESTIMATED BLOOD LOSS: 71mL  DRAINS: 150 clear urine catheter  TOTAL IV FLUIDS: 700 mL Lactated Ringers  SPECIMENS: products of conception to pathology  VTE PROPHYLAXIS: SCDs to the bilateral lower extremities  ANTIBIOTICS: Doxycycline 100mg  IV x 1 pre op  COMPLICATIONS: none  DISPOSITION: PACU - hemodynamically stable.  CONDITION: stable  BLOOD TYPE:  A POS Rhogam given:not applicable  FINDINGS: Exam under anesthesia revealed 6 week sized uterus with no masses and bilateral adnexa without masses or fullness. Necrotic appearing products of conception were seen. Gritty texture in all four quadrants.   PROCEDURE IN DETAIL:  After informed consent was obtained, the patient was taken to the operating room where anesthesia was obtained without difficulty. The patient was positioned in the dorsal lithotomy position in [place]. The patient was examined under anesthesia, with the above noted findings.  The bi-valved speculum was placed inside the patient's vagina, and the the anterior lip of the cervix was seen and grasped with the tenaculum.  A paracervical block was achieved with 90mL of 1% lidocaine and then the cervix was able to easily pass an 71mm suction cannula.  The suction was then calibrated to 45mmHg and connected to the number 8 cannula, which was then introduced with the above noted findings. A gentle curettage was done, with no products of conception.   The suction was then done one more time to remove the curettage material.   Excellent hemostasis was noted, and all instruments were removed, with use of one silver nitrate stick to 11 o'clock position of cervix, excellent  hemostasis noted throughout.  She was then taken out of dorsal lithotomy. The patient tolerated the procedure well.  Sponge, lap counts were correct x2.  The patient was taken to recovery room in excellent condition.   Zenda Alpers, DO OB Fellow [facility]   Attestation of Attending Supervision of Fellow: Evaluation and management procedures were performed by the fellow under my supervision and I was present during the entire procedure.  I have seen and examined the patient,  reviewed the fellow's note and chart, and I agree with the management and plan.   Durene Romans MD Attending Center for [employer] manager)

## 2015-09-08 NOTE — Discharge Instructions (Signed)
Dilation and Curettage or Vacuum Curettage, Care After These instructions give you information on caring for yourself after your procedure. Your doctor may also give you more specific instructions. Call your doctor if you have any problems or questions after your procedure. HOME CARE  Do not drive for 24 hours.  Wait 1 week before doing any activities that wear you out.  Take your temperature 2 times a day for 4 days. Write it down. Tell your doctor if you have a fever.  Do not stand for a long time.  Do not lift, push, or pull anything over 10 pounds (4.5 kilograms).  Limit stair climbing to once or twice a day.  Rest often.  Continue with your usual diet.  Drink enough fluids to keep your pee (urine) clear or pale yellow.  If you have a hard time pooping (constipation), you may:  Take a medicine to help you go poop (laxative) as told by your doctor.  Eat more fruit and bran.  Drink more fluids.  Take showers, not baths, for as long as told by your doctor.  Do not swim or use a hot tub until your doctor says it is okay.  Have someone with you for 1-2 days after the procedure.  Do not douche, use tampons, or have sex (intercourse) for 2 weeks.  Only take medicines as told by your doctor. Do not take aspirin. It can cause bleeding.  Keep all doctor visits. GET HELP IF:  You have cramps or pain not helped by medicine.  You have new pain in the belly (abdomen).  You have a bad smelling fluid coming from your vagina.  You have a rash.  You have problems with any medicine. GET HELP RIGHT AWAY IF:   You start to bleed more than a regular period.  You have a fever.  You have chest pain.  You have trouble breathing.  You feel dizzy or feel like passing out (fainting).  You pass out.  You have pain in the tops of your shoulders.  You have vaginal bleeding with or without clumps of blood (blood clots). MAKE SURE YOU:  Understand these instructions.  Will  watch your condition.  Will get help right away if you are not doing well or get worse.   This information is not intended to replace advice given to you by your health care provider. Make sure you discuss any questions you have with your health care provider.   Document Released: 11/18/2007 Document Revised: 02/13/2013 Document Reviewed: 09/07/2012 Elsevier Interactive Patient Education 2016 [place]: D&C / D&E The following instructions have been prepared to help you care for yourself upon your return home.   Personal hygiene:  Use sanitary pads for vaginal drainage, not tampons.  Shower the day after your procedure.  NO tub baths, pools or Jacuzzis for 2-3 weeks.  Wipe front to back after using the bathroom.  Activity and limitations:  Do NOT drive or operate any equipment for 24 hours. The effects of anesthesia are still present and drowsiness may result.  Do NOT rest in bed all day.  Walking is encouraged.  Walk up and down stairs slowly.  You may resume your normal activity in one to two days or as indicated by your physician.  Sexual activity: NO intercourse for at least 2 weeks after the procedure, or as indicated by your physician.  Diet: Eat a light meal as desired this evening. You may resume your usual diet tomorrow.  Return  to work: You may resume your work activities in one to two days or as indicated by your doctor.  What to expect after your surgery: Expect to have vaginal bleeding/discharge for 2-3 days and spotting for up to 10 days. It is not unusual to have soreness for up to 1-2 weeks. You may have a slight burning sensation when you urinate for the first day. Mild cramps may continue for a couple of days. You may have a regular period in 2-6 weeks.  Call your doctor for any of the following:  Excessive vaginal bleeding, saturating and changing one pad every hour.  Inability to urinate 6 hours after discharge from  hospital.  Pain not relieved by pain medication.  Fever of 100.4 F or greater.  Unusual vaginal discharge or odor.   Call for an appointment:    Patients signature: ______________________  Nurses signature ________________________  Support person's signature_______________________    Post Anesthesia Home Care Instructions  Activity: Get plenty of rest for the remainder of the day. A responsible adult should stay with you for 24 hours following the procedure.  For the next 24 hours, DO NOT: -Drive a car -Paediatric nurse -Drink alcoholic beverages -Take any medication unless instructed by your physician -Make any legal decisions or sign important papers.  Meals: Start with liquid foods such as gelatin or soup. Progress to regular foods as tolerated. Avoid greasy, spicy, heavy foods. If nausea and/or vomiting occur, drink only clear liquids until the nausea and/or vomiting subsides. Call your physician if vomiting continues.  Special Instructions/Symptoms: Your throat may feel dry or sore from the anesthesia or the breathing tube placed in your throat during surgery. If this causes discomfort, gargle with warm salt water. The discomfort should disappear within 24 hours.  If you had a scopolamine patch placed behind your ear for the management of post- operative nausea and/or vomiting:  1. The medication in the patch is effective for 72 hours, after which it should be removed.  Wrap patch in a tissue and discard in the trash. Wash hands thoroughly with soap and water. 2. You may remove the patch earlier than 72 hours if you experience unpleasant side effects which may include dry mouth, dizziness or visual disturbances. 3. Avoid touching the patch. Wash your hands with soap and water after contact with the patch.

## 2015-09-08 NOTE — Anesthesia Procedure Notes (Signed)
Procedure Name: LMA Insertion Date/Time: 09/08/2015 12:34 PM Performed by: Raenette Rover Pre-anesthesia Checklist: Patient identified, Emergency Drugs available, Suction available and Patient being monitored Patient Re-evaluated:Patient Re-evaluated prior to inductionOxygen Delivery Method: Circle system utilized Preoxygenation: Pre-oxygenation with 100% oxygen Intubation Type: IV induction Ventilation: Mask ventilation without difficulty LMA: LMA inserted LMA Size: 4.0 Number of attempts: 1 Placement Confirmation: positive ETCO2,  CO2 detector and breath sounds checked- equal and bilateral Tube secured with: Tape Dental Injury: Teeth and Oropharynx as per pre-operative assessment

## 2015-09-08 NOTE — MAU Note (Signed)
Last Monday, had an abortion.  Went to [facility] on Friday because of continued pain, had been bleeding all along, passed little pieces of ? Tissue.  Had [place]- was told Retained POC, probable cause of pain.  Was to be referred- which did not happen.  Pt wanted to come here.  Was given Tramadol, and instructed to f/u as she needed a D&C

## 2015-09-09 ENCOUNTER — Encounter (HOSPITAL_BASED_OUTPATIENT_CLINIC_OR_DEPARTMENT_OTHER): Payer: Self-pay | Admitting: *Deleted

## 2015-09-09 NOTE — Anesthesia Postprocedure Evaluation (Signed)
Anesthesia Post Note  Patient: Stephanie Yu  Procedure(s) Performed: Procedure(s) (LRB): DILATATION AND EVACUATION (N/A)  Patient location during evaluation: PACU Anesthesia Type: General Level of consciousness: sedated Pain management: satisfactory to patient Vital Signs Assessment: post-procedure vital signs reviewed and stable Respiratory status: spontaneous breathing Cardiovascular status: stable Anesthetic complications: no     Last Vitals:  Filed Vitals:   09/08/15 1515 09/08/15 1545  BP: 110/75 103/70  Pulse: 71 75  Temp: 36.6 C 36.4 C  Resp: 17 16    Last Pain:  Filed Vitals:   09/08/15 1600  PainSc: 1    Pain Goal: Patients Stated Pain Goal: 4 (09/08/15 1545)               Lyndle Herrlich EDWARD

## 2015-09-11 ENCOUNTER — Encounter (HOSPITAL_COMMUNITY): Payer: Self-pay | Admitting: Obstetrics and Gynecology

## 2015-11-10 ENCOUNTER — Inpatient Hospital Stay (HOSPITAL_COMMUNITY)
Admission: AD | Admit: 2015-11-10 | Discharge: 2015-11-10 | Disposition: A | Discharge status: Home / Self Care | Payer: Medicaid Other | Source: Ambulatory Visit | Attending: Obstetrics & Gynecology | Admitting: Obstetrics & Gynecology

## 2015-11-10 ENCOUNTER — Encounter (HOSPITAL_COMMUNITY): Payer: Self-pay | Admitting: *Deleted

## 2015-11-10 ENCOUNTER — Inpatient Hospital Stay (HOSPITAL_COMMUNITY): Payer: Medicaid Other

## 2015-11-10 DIAGNOSIS — O23591 Infection of other part of genital tract in pregnancy, first trimester: Secondary | ICD-10-CM | POA: Diagnosis not present

## 2015-11-10 DIAGNOSIS — N760 Acute vaginitis: Secondary | ICD-10-CM

## 2015-11-10 DIAGNOSIS — O200 Threatened abortion: Secondary | ICD-10-CM | POA: Insufficient documentation

## 2015-11-10 DIAGNOSIS — O209 Hemorrhage in early pregnancy, unspecified: Secondary | ICD-10-CM

## 2015-11-10 DIAGNOSIS — B373 Candidiasis of vulva and vagina: Secondary | ICD-10-CM

## 2015-11-10 DIAGNOSIS — B3731 Acute candidiasis of vulva and vagina: Secondary | ICD-10-CM

## 2015-11-10 DIAGNOSIS — Z880 Allergy status to penicillin: Secondary | ICD-10-CM | POA: Diagnosis not present

## 2015-11-10 DIAGNOSIS — Z3A01 Less than 8 weeks gestation of pregnancy: Secondary | ICD-10-CM | POA: Diagnosis not present

## 2015-11-10 DIAGNOSIS — O98811 Other maternal infectious and parasitic diseases complicating pregnancy, first trimester: Secondary | ICD-10-CM | POA: Diagnosis not present

## 2015-11-10 DIAGNOSIS — B9689 Other specified bacterial agents as the cause of diseases classified elsewhere: Secondary | ICD-10-CM

## 2015-11-10 LAB — URINALYSIS, ROUTINE W REFLEX MICROSCOPIC
Bilirubin Urine: NEGATIVE
Glucose, UA: NEGATIVE mg/dL
KETONES UR: NEGATIVE mg/dL
Leukocytes, UA: NEGATIVE
NITRITE: NEGATIVE
Protein, ur: NEGATIVE mg/dL
SPECIFIC GRAVITY, URINE: 1.02 (ref 1.005–1.030)
pH: 6.5 (ref 5.0–8.0)

## 2015-11-10 LAB — WET PREP, GENITAL
Sperm: NONE SEEN
TRICH WET PREP: NONE SEEN

## 2015-11-10 LAB — URINE MICROSCOPIC-ADD ON

## 2015-11-10 LAB — POCT PREGNANCY, URINE: Preg Test, Ur: POSITIVE — AB

## 2015-11-10 LAB — HCG, QUANTITATIVE, PREGNANCY: hCG, Beta Chain, Quant, S: 36261 m[IU]/mL — ABNORMAL HIGH (ref <5)

## 2015-11-10 IMAGING — US US OB COMP LESS 14 WK
1 series · 15 of 28 positions shown · non-contrast
Comparison: None.

CLINICAL DATA: Vaginal bleeding for 1 day.  Pelvic cramping.

EXAM:
OBSTETRIC <14 WK US AND TRANSVAGINAL OB US
TECHNIQUE: Both transabdominal and transvaginal ultrasound examinations were
performed for complete evaluation of the gestation as well as the
maternal uterus, adnexal regions, and pelvic cul-de-sac.
Transvaginal technique was performed to assess early pregnancy.

[Series 1: us ob comp less 14 wk · 15 of 113 slices shown]
[im 1/113]
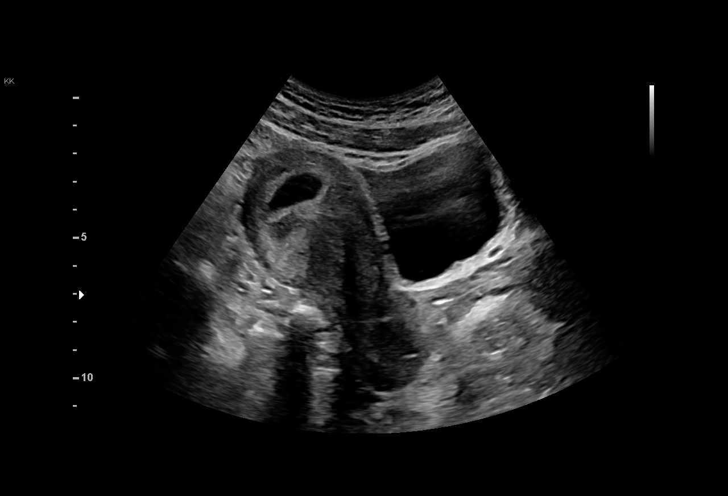
[im 9/113]
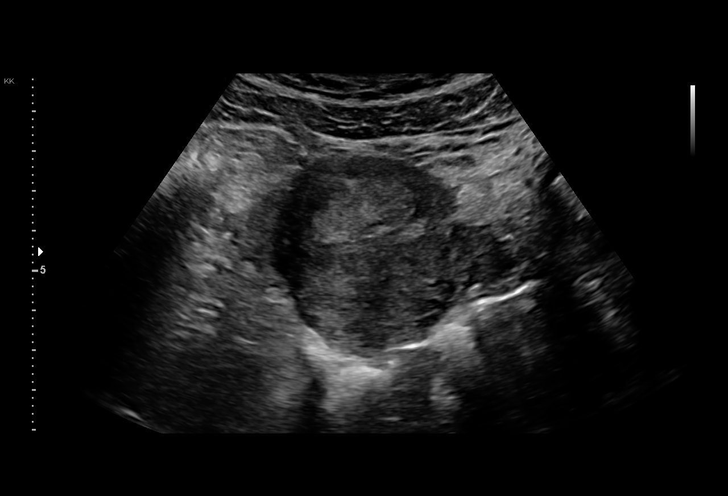
[im 17/113]
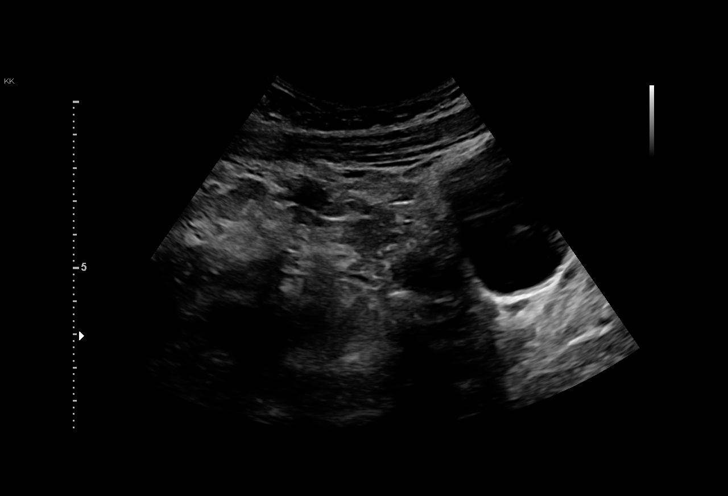
[im 25/113]
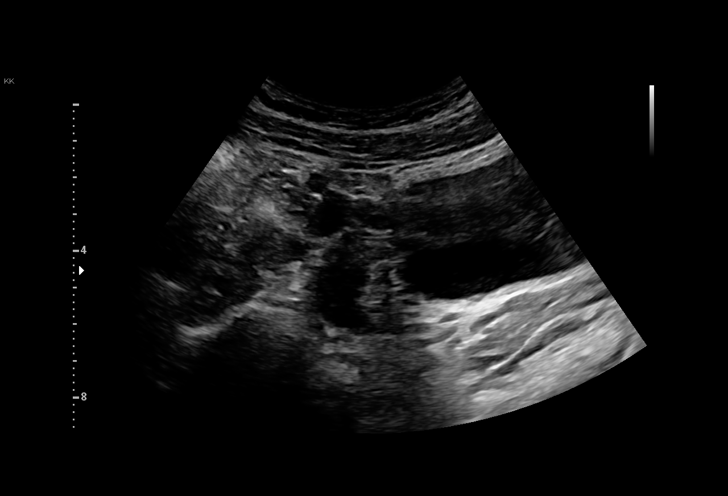
[im 34/113]
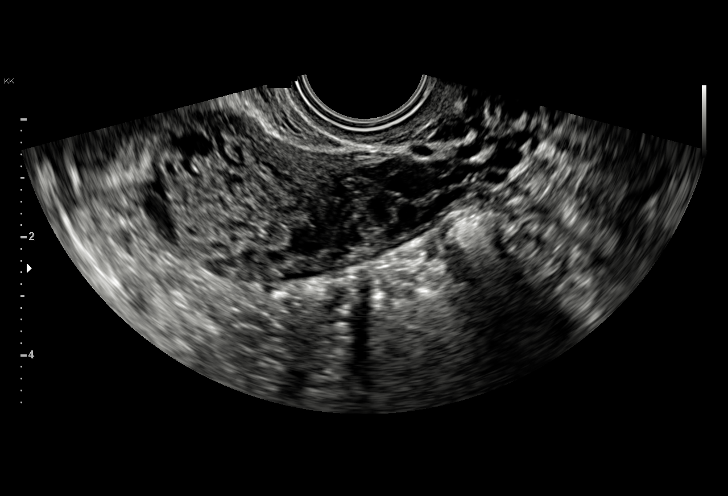
[im 42/113]
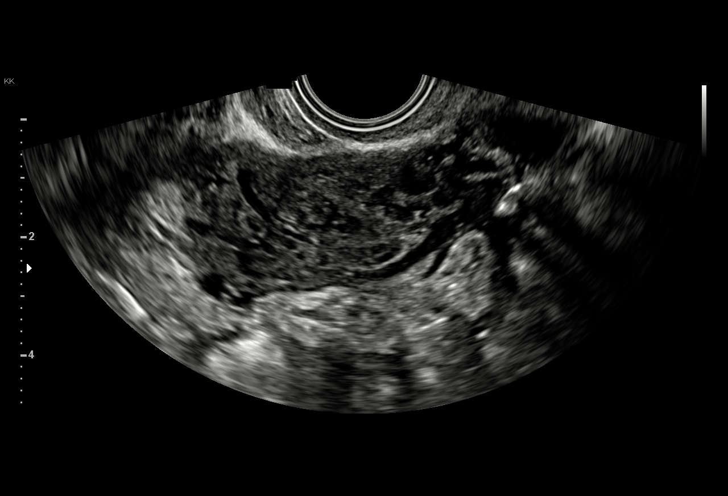
[im 50/113]
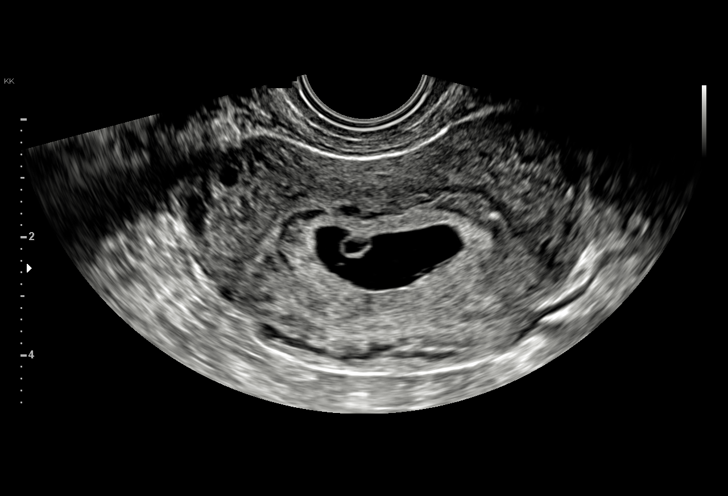
[im 59/113]
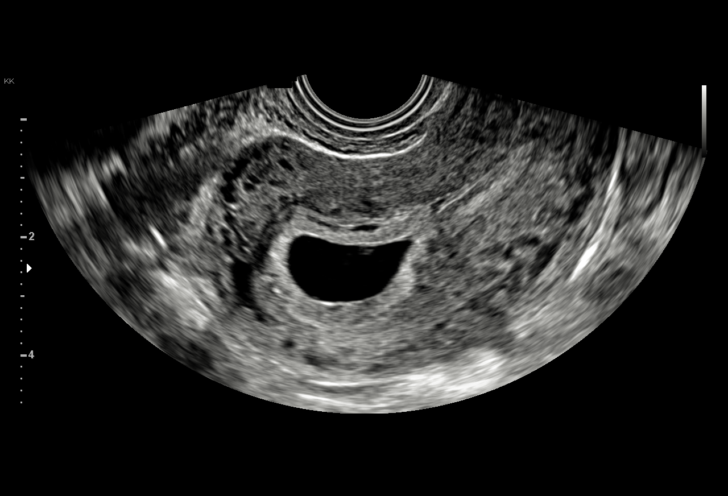
[im 63/113]
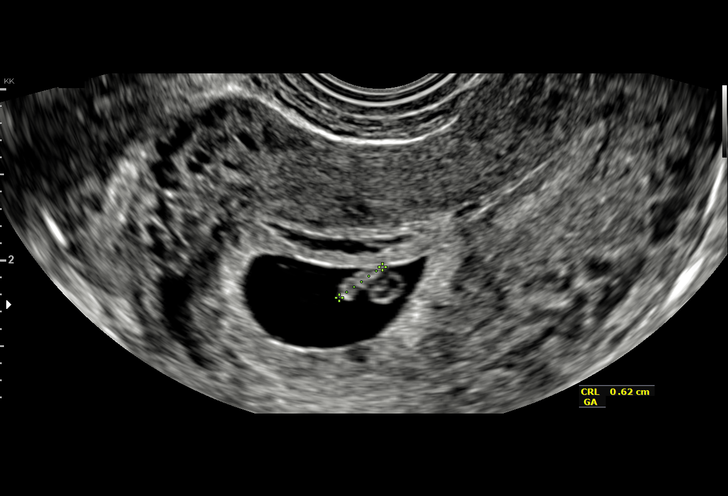
[im 71/113]
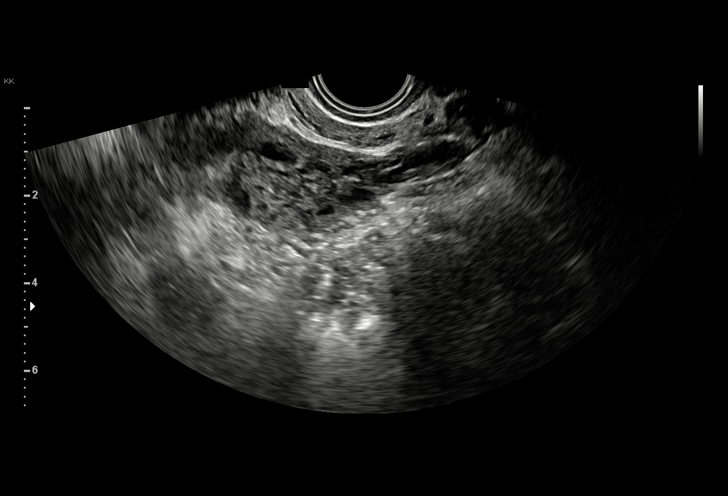
[im 79/113]
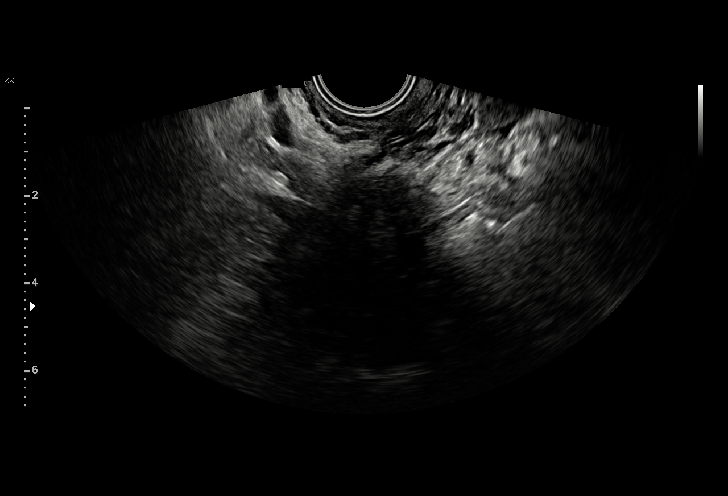
[im 88/113]
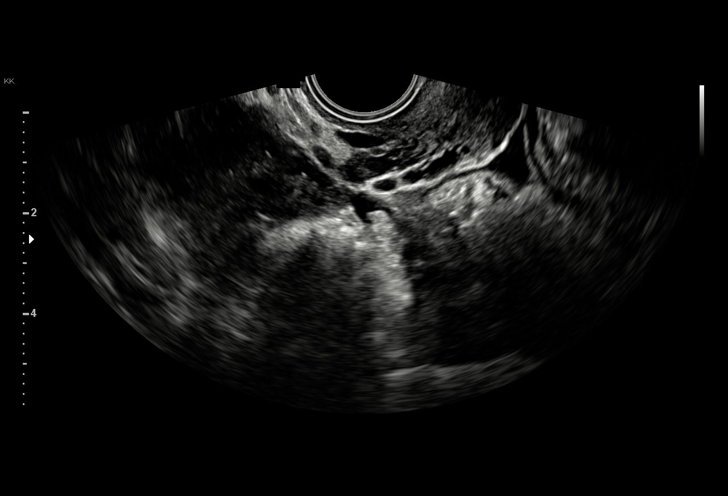
[im 96/113]
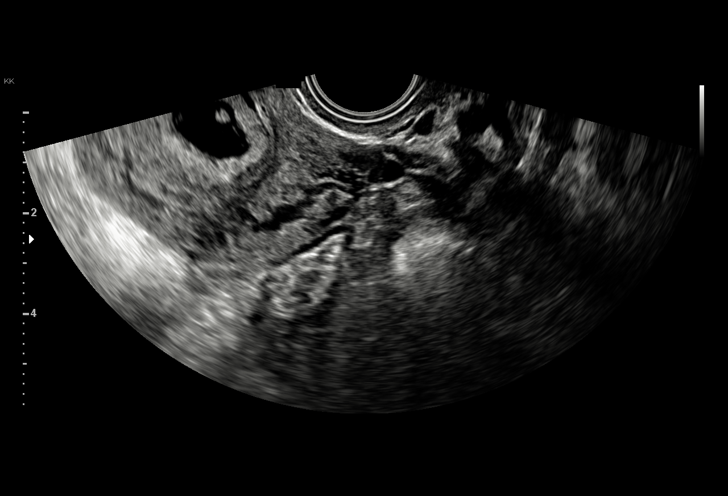
[im 104/113]
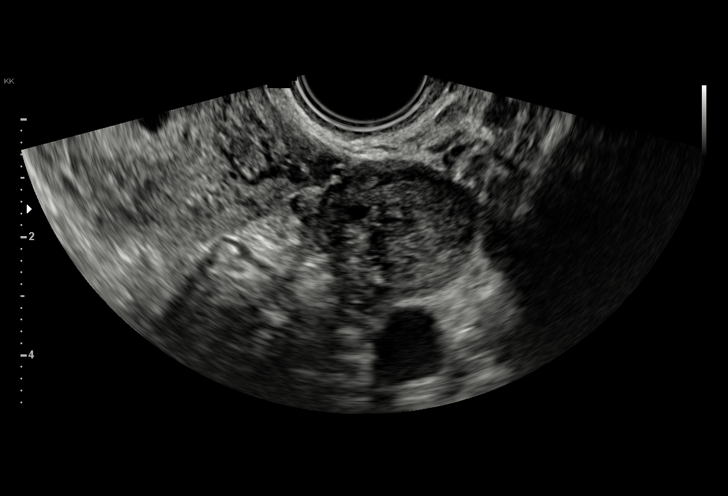
[im 113/113]
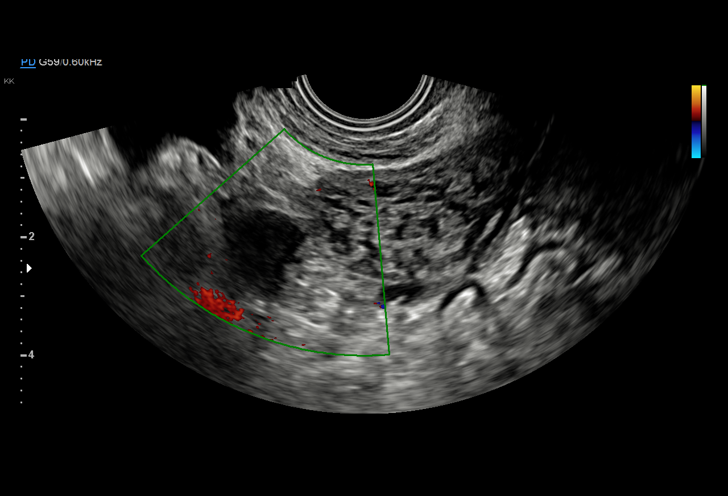

[15 of 28 positions shown; findings below may reference images not displayed]

FINDINGS: Intrauterine gestational sac: Visualized

Yolk sac:  Visualized

Embryo:  Visualized

Cardiac Activity: Visualized

Heart Rate: 114  bpm

CRL:  6  mm   6 w   3 d                  US EDC: July 02, 2016

Subchorionic hemorrhage: Small amount of subchorionic hemorrhage
measuring 7 x 3 mm.

Maternal uterus/adnexae: There is a small leiomyoma in the uterine
fundus measuring 1.4 x 1.3 x 1.7 cm. Cervical os is closed. The
ovaries appear normal in size and contour bilaterally. Corpus luteum
noted in the left ovary. There is a small amount of free pelvic
fluid.
IMPRESSION: Single live intrauterine gestation with estimated gestational age of
6+ weeks. There is a small subchorionic hemorrhage as well as a
small leiomyoma in the superior uterine fundus. Small amount of free
pelvic fluid noted.

## 2015-11-10 MED ORDER — METRONIDAZOLE 500 MG PO TABS: 500.0000 mg | ORAL_TABLET | Freq: Two times a day (BID) | ORAL | 0 refills | Status: DC

## 2015-11-10 MED ORDER — ACETAMINOPHEN 500 MG PO TABS
1000.0000 mg | ORAL_TABLET | Freq: Once | ORAL | Status: AC
  Administered 2015-11-10: 1000 mg via ORAL
  Filled 2015-11-10: qty 2

## 2015-11-10 MED ORDER — PREPLUS 27-1 MG PO TABS: 1.0000 | ORAL_TABLET | Freq: Every day | ORAL | 13 refills | Status: DC

## 2015-11-10 MED ORDER — TERCONAZOLE 0.4 % VA CREA: 1.0000 | TOPICAL_CREAM | Freq: Every day | VAGINAL | 0 refills | Status: DC

## 2015-11-10 NOTE — Discharge Instructions (Signed)

## 2015-11-10 NOTE — MAU Note (Signed)
Pt has been cramping for the last two days.  She said she had bright red bleeding when she wiped she said she put on a pad.  Rates pain 6/10 right now.

## 2015-11-10 NOTE — MAU Provider Note (Signed)
History     CSN: [id number]  Arrival date and time: 11/10/15 1526   First Provider Initiated Contact with Patient 11/10/15 1652     Chief Complaint  Patient presents with  . Vaginal Bleeding   HPI Stephanie Yu is a 25 y.o. G3P0020 here for evaluation of bleeding in early pregnancy. Recently had TAB in [id number] complicated by retained POCs needing D&E.   Reported that she never got her period since her D&E.  Had some spotting and cramping that started 3 days ago, has gotten worse since then. Took UPT at home that was positive.  Denies any fevers, chills, sweats, N/V , other GI or GU symptoms.  Obstetric History   G3   P0   T0   P0   A2   L0    SAB1   TAB0   Ectopic0   Multiple0   Live Births0     # Outcome Date GA Lbr Len/2nd Weight Sex Delivery Anes PTL Lv  3 Current           2 SAB           1 TAB               Past Medical History:  Diagnosis Date  . Anxiety   . Medical history non-contributory     Past Surgical History:  Procedure Laterality Date  . DILATION AND CURETTAGE OF UTERUS    . DILATION AND EVACUATION N/A 09/08/2015   Procedure: DILATATION AND EVACUATION;  Surgeon: Aletha Halim, MD;  Location: [facility];  Service: Gynecology;  Laterality: N/A;    Family History  Problem Relation Age of Onset  . Hypertension Mother   . Hypertension Father   . Diabetes Maternal Grandmother     Social History  Substance Use Topics  . Smoking status: Current Every Day Smoker    Packs/day: 0.25    Years: 10.00    Types: Cigarettes  . Smokeless tobacco: Never Used  . Alcohol use No    Allergies:  Allergies  Allergen Reactions  . Amoxicillin     rash  . Augmentin [Amoxicillin-Pot Clavulanate]   . Penicillins     Has patient had a PCN reaction causing immediate rash, facial/tongue/throat swelling, SOB or lightheadedness with hypotension: No Has patient had a PCN reaction causing severe rash involving mucus membranes or skin necrosis: yes Has patient had a PCN  reaction that required hospitalization No Has patient had a PCN reaction occurring within the last 10 years: No If all of the above answers are "NO", then may proceed with Cephalosporin use.   . Vicodin [Hydrocodone-Acetaminophen] Itching    Nausea and vomiting    Prescriptions Prior to Admission  Medication Sig Dispense Refill Last Dose  . ALPRAZolam (XANAX) 1 MG tablet Take 1 mg by mouth at bedtime as needed for anxiety.   09/08/2015 at Unknown time  . alprazolam (XANAX) 2 MG tablet Take 2 mg by mouth 2 times daily at 12 noon and 4 pm.   Taking  . azithromycin (ZITHROMAX Z-PAK) 250 MG tablet 2 po day one, then 1 daily x 4 days 5 tablet 0   . cephALEXin (KEFLEX) 500 MG capsule Take 500 mg by mouth 4 (four) times daily.   09/08/2015 at Unknown time  . ibuprofen (ADVIL,MOTRIN) 600 MG tablet Take 1 tablet (600 mg total) by mouth every 6 (six) hours as needed. 30 tablet 0   . ibuprofen (ADVIL,MOTRIN) 600 MG tablet Take 1 tablet (600 mg total)  by mouth every 6 (six) hours as needed. 30 tablet 0   . ibuprofen (ADVIL,MOTRIN) 800 MG tablet Take 800 mg by mouth every 8 (eight) hours as needed for moderate pain.   09/07/2015 at Unknown time  . loratadine (CLARITIN) 10 MG tablet Take 1 tablet (10 mg total) by mouth daily. 30 tablet 0   . mometasone (NASONEX) 50 MCG/ACT nasal spray Place 2 sprays into the nose daily. 17 g 12   . oxyCODONE-acetaminophen (PERCOCET/ROXICET) 5-325 MG tablet Take 1 tablet by mouth every 4 (four) hours as needed for severe pain.   09/07/2015 at Unknown time  . oxyCODONE-acetaminophen (PERCOCET/ROXICET) 5-325 MG tablet Take 1 tablet by mouth every 6 (six) hours as needed for severe pain. 3 tablet 0   . promethazine-dextromethorphan (PROMETHAZINE-DM) 6.25-15 MG/5ML syrup Take 5 mLs by mouth 4 (four) times daily as needed for cough. 118 mL 0     Review of Systems  Constitutional: Negative for chills and fever.  Gastrointestinal: Positive for abdominal pain. Negative for diarrhea,  nausea and vomiting.  Genitourinary: Negative for dysuria, flank pain, frequency, hematuria and urgency.  Musculoskeletal: Negative for myalgias.   Physical Exam   Blood pressure 124/71, pulse (!) 125, temperature 97.8 F (36.6 C), temperature source Oral, resp. rate 18, last menstrual period 06/27/2015, unknown if currently breastfeeding.  Physical Exam  Constitutional: She is oriented to person, place, and time. She appears well-developed and well-nourished.  HENT:  Head: Normocephalic and atraumatic.  Neck: Normal range of motion. Neck supple.  Cardiovascular: Normal rate.   Respiratory: Effort normal and breath sounds normal.  GI: Soft. She exhibits no distension and no mass. There is tenderness. There is no guarding.  Diffuse moderate lower abdominal tenderness  Genitourinary: Vaginal discharge found.  Genitourinary Comments: Moderate brown vaginal discharge. Cultures obtained. No active bleeding. Closed cervix.  Musculoskeletal: Normal range of motion.  Neurological: She is alert and oriented to person, place, and time.  Skin: Skin is warm and dry.  Psychiatric: She has a normal mood and affect.    MAU Course  Procedures  MDM 1710: Tylenol 1000 mg po x 1 given Pelvic ultrasound ordered  Results for orders placed or performed during the hospital encounter of 11/10/15 (from the past 24 hour(s))  Urinalysis, Routine w reflex microscopic (not at [facility])     Status: Abnormal   Collection Time: 11/10/15  3:30 PM  Result Value Ref Range   Color, Urine YELLOW YELLOW   APPearance CLEAR CLEAR   Specific Gravity, Urine 1.020 1.005 - 1.030   pH 6.5 5.0 - 8.0   Glucose, UA NEGATIVE NEGATIVE mg/dL   Hgb urine dipstick LARGE (A) NEGATIVE   Bilirubin Urine NEGATIVE NEGATIVE   Ketones, ur NEGATIVE NEGATIVE mg/dL   Protein, ur NEGATIVE NEGATIVE mg/dL   Nitrite NEGATIVE NEGATIVE   Leukocytes, UA NEGATIVE NEGATIVE  Urine microscopic-add on     Status: Abnormal   Collection Time:  11/10/15  3:30 PM  Result Value Ref Range   Squamous Epithelial / LPF 0-5 (A) NONE SEEN   WBC, UA 0-5 0 - 5 WBC/hpf   RBC / HPF 6-30 0 - 5 RBC/hpf   Bacteria, UA RARE (A) NONE SEEN  Pregnancy, urine POC     Status: Abnormal   Collection Time: 11/10/15  4:26 PM  Result Value Ref Range   Preg Test, Ur POSITIVE (A) NEGATIVE  Wet prep, genital     Status: Abnormal   Collection Time: 11/10/15  4:55 PM  Result  Value Ref Range   Yeast Wet Prep HPF POC PRESENT (A) NONE SEEN   Trich, Wet Prep NONE SEEN NONE SEEN   Clue Cells Wet Prep HPF POC PRESENT (A) NONE SEEN   WBC, Wet Prep HPF POC MODERATE (A) NONE SEEN   Sperm NONE SEEN   hCG, quantitative, pregnancy     Status: Abnormal   Collection Time: 11/10/15  5:02 PM  Result Value Ref Range   hCG, Beta Chain, Quant, S 36,261 (H) <5 mIU/mL   OB Ultrasound (preliminary read):  Viable IUP at [redacted]w[redacted]d, small subchorionic hemorrhage   Assessment and Plan  Bleeding in early pregnancy/Threatened miscarriage:  Ultrasound results showing early viable IUP and small subchorionic hemorrhage discussed with patient.  Patient advised to start prenatal care soon, prenatal vitamins prescribed.  Pregnancy verification letter given to patient.  List of OB providers given to patient.  Bleeding precautions reviewed. BV: Metronidazole prescribed Yeast: Terazol prescribed Discharged to home in stable condition  Demetris Capell A, MD 11/10/2015, 6:54 PM

## 2015-11-11 LAB — GC/CHLAMYDIA PROBE AMP (~~LOC~~) NOT AT ARMC
Chlamydia: NEGATIVE
NEISSERIA GONORRHEA: NEGATIVE

## 2015-11-14 ENCOUNTER — Encounter (HOSPITAL_COMMUNITY): Payer: Self-pay

## 2015-11-14 ENCOUNTER — Inpatient Hospital Stay (HOSPITAL_COMMUNITY): Payer: Medicaid Other

## 2015-11-14 ENCOUNTER — Inpatient Hospital Stay (HOSPITAL_COMMUNITY)
Admission: AD | Admit: 2015-11-14 | Discharge: 2015-11-14 | Disposition: A | Discharge status: Home / Self Care | Payer: Medicaid Other | Source: Ambulatory Visit | Attending: Obstetrics & Gynecology | Admitting: Obstetrics & Gynecology

## 2015-11-14 DIAGNOSIS — Z3A01 Less than 8 weeks gestation of pregnancy: Secondary | ICD-10-CM | POA: Diagnosis not present

## 2015-11-14 DIAGNOSIS — O99331 Smoking (tobacco) complicating pregnancy, first trimester: Secondary | ICD-10-CM | POA: Insufficient documentation

## 2015-11-14 DIAGNOSIS — Z3490 Encounter for supervision of normal pregnancy, unspecified, unspecified trimester: Secondary | ICD-10-CM

## 2015-11-14 DIAGNOSIS — F17210 Nicotine dependence, cigarettes, uncomplicated: Secondary | ICD-10-CM | POA: Insufficient documentation

## 2015-11-14 DIAGNOSIS — O468X1 Other antepartum hemorrhage, first trimester: Secondary | ICD-10-CM

## 2015-11-14 DIAGNOSIS — Z885 Allergy status to narcotic agent status: Secondary | ICD-10-CM | POA: Diagnosis not present

## 2015-11-14 DIAGNOSIS — O209 Hemorrhage in early pregnancy, unspecified: Secondary | ICD-10-CM | POA: Diagnosis not present

## 2015-11-14 DIAGNOSIS — Z880 Allergy status to penicillin: Secondary | ICD-10-CM | POA: Diagnosis not present

## 2015-11-14 DIAGNOSIS — O418X10 Other specified disorders of amniotic fluid and membranes, first trimester, not applicable or unspecified: Secondary | ICD-10-CM

## 2015-11-14 LAB — HCG, QUANTITATIVE, PREGNANCY: HCG, BETA CHAIN, QUANT, S: 53603 m[IU]/mL — AB (ref <5)

## 2015-11-14 LAB — CBC
HCT: 35 % — ABNORMAL LOW (ref 36.0–46.0)
Hemoglobin: 12.4 g/dL (ref 12.0–15.0)
MCH: 31.6 pg (ref 26.0–34.0)
MCHC: 35.4 g/dL (ref 30.0–36.0)
MCV: 89.1 fL (ref 78.0–100.0)
PLATELETS: 291 10*3/uL (ref 150–400)
RBC: 3.93 MIL/uL (ref 3.87–5.11)
RDW: 13.1 % (ref 11.5–15.5)
WBC: 11.3 10*3/uL — AB (ref 4.0–10.5)

## 2015-11-14 IMAGING — US US OB TRANSVAGINAL
1 series · 15 of 28 positions shown · non-contrast
Comparison: 11/10/2015

CLINICAL DATA: Low abdominal cramping. Gestational age based on
previous ultrasound is 7 weeks 0 days. Quantitative beta HCG is
pending.

EXAM:
TRANSVAGINAL OB ULTRASOUND
TECHNIQUE: Transvaginal ultrasound was performed for complete evaluation of the
gestation as well as the maternal uterus, adnexal regions, and
pelvic cul-de-sac.

[Series 1: us ob transvaginal · 15 of 38 slices shown]
[im 1/38]
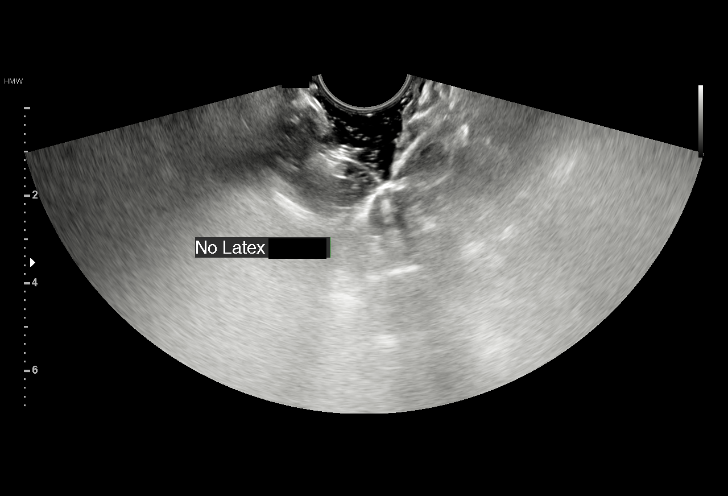
[im 3/38]
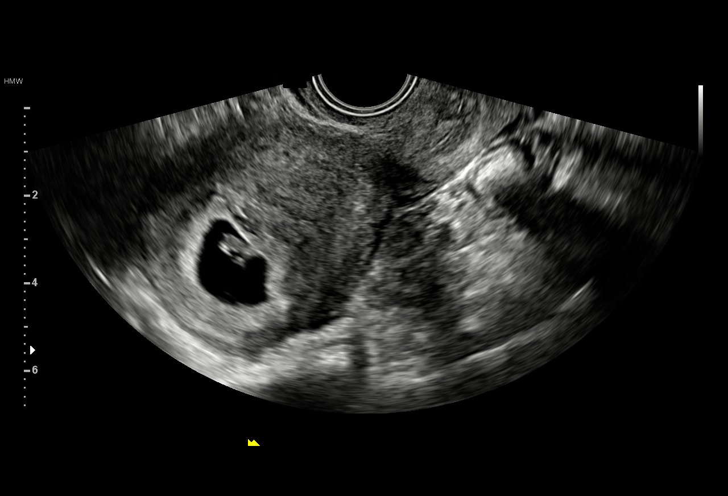
[im 6/38]
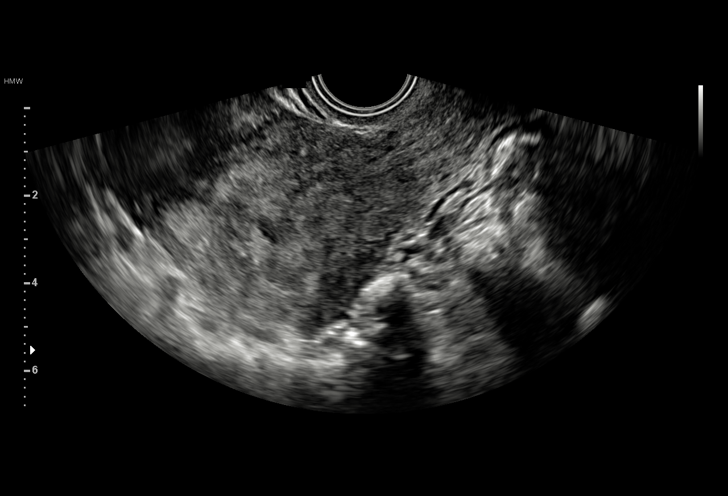
[im 9/38]
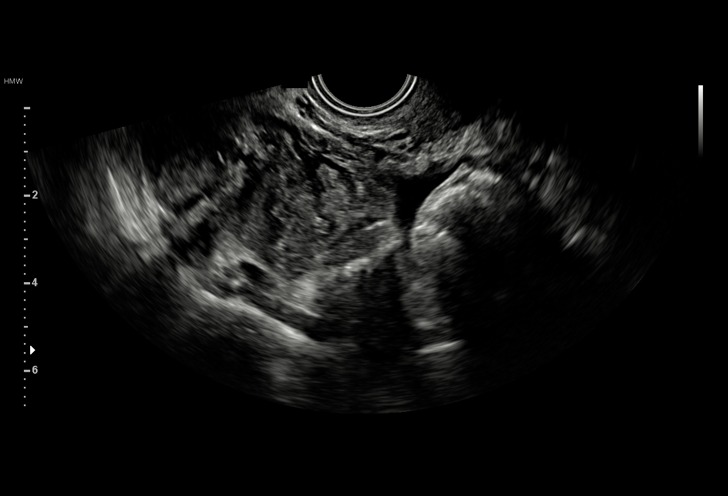
[im 11/38]
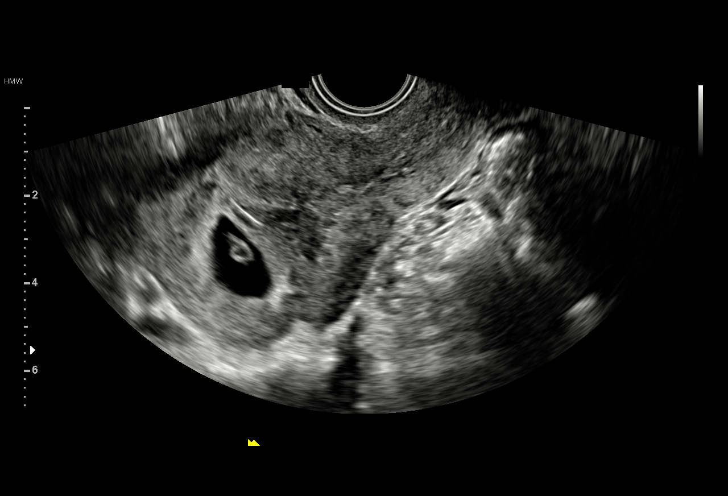
[im 14/38]
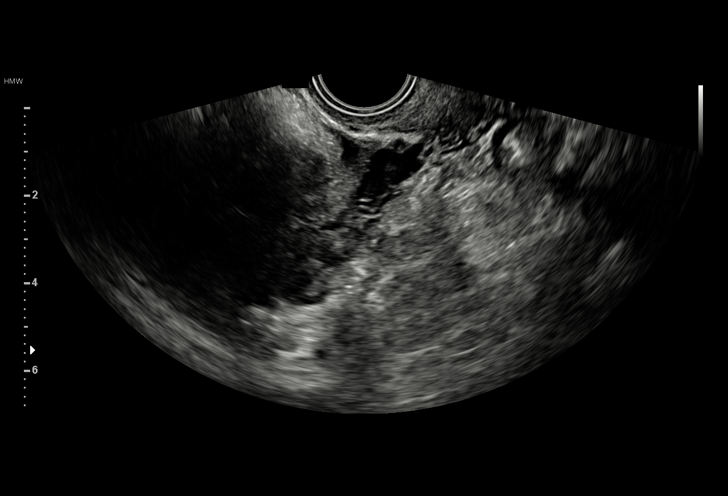
[im 17/38]
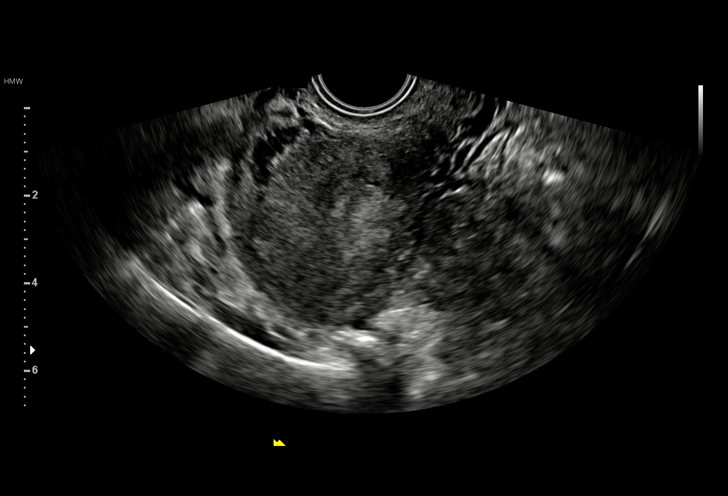
[im 20/38]
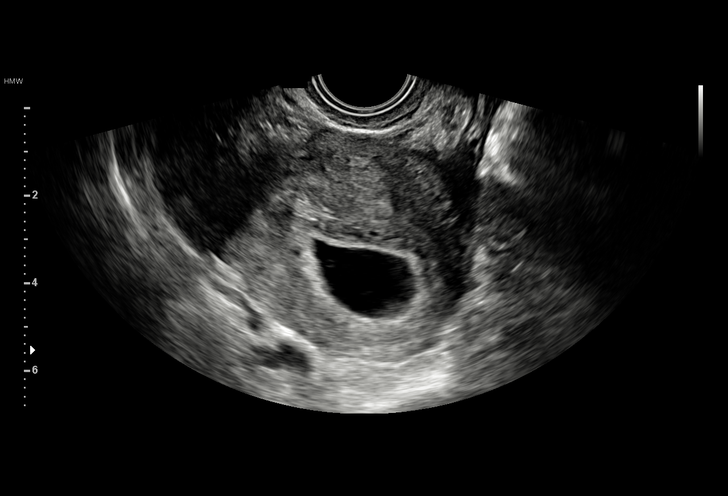
[im 21/38]
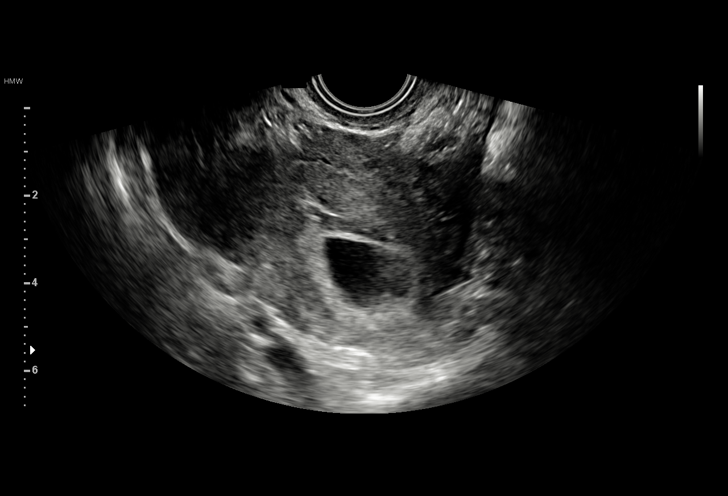
[im 24/38]
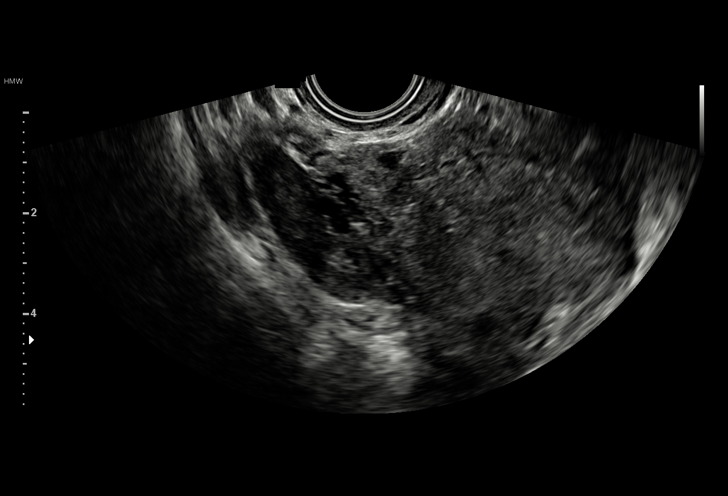
[im 27/38]
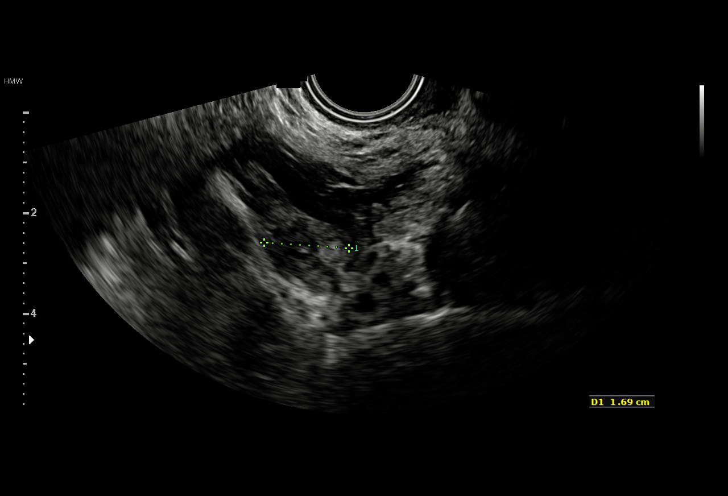
[im 29/38]
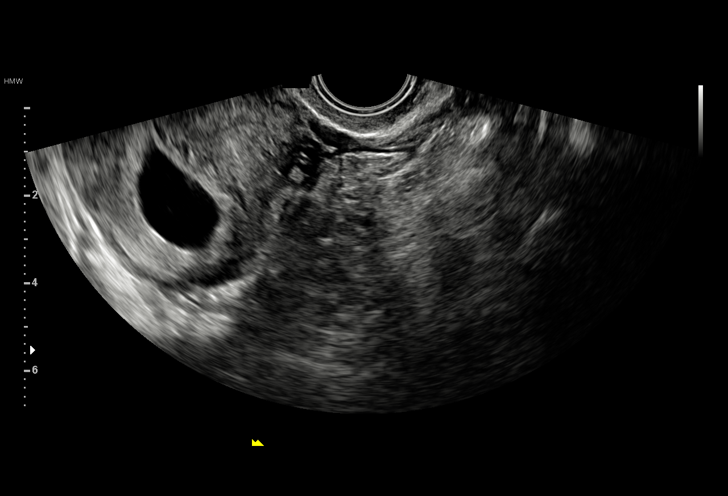
[im 32/38]
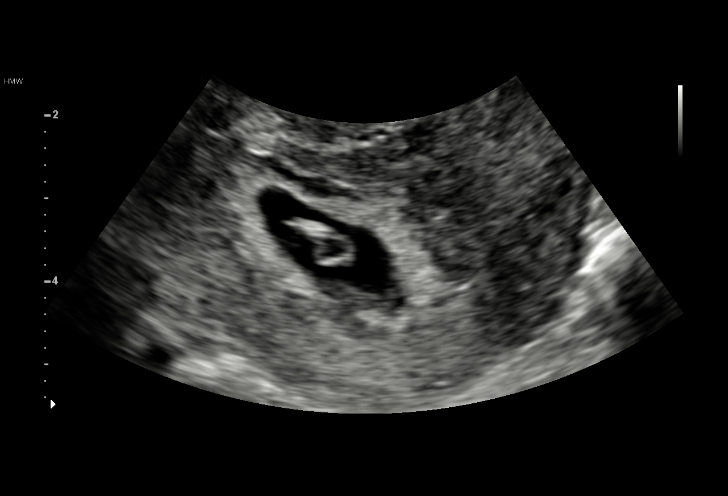
[im 35/38]
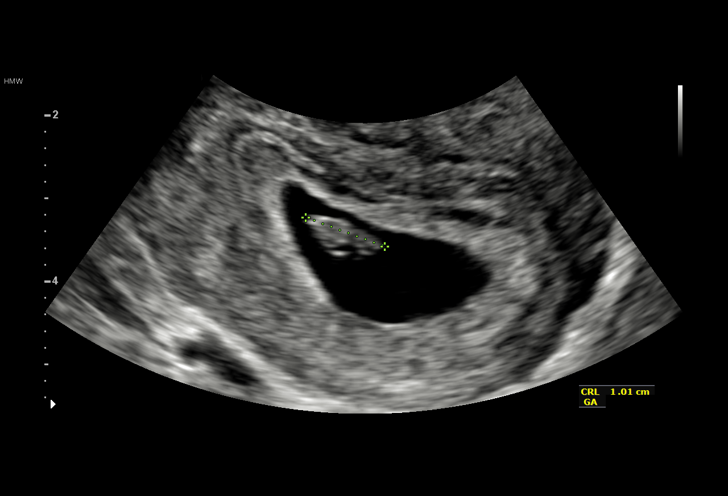
[im 38/38]
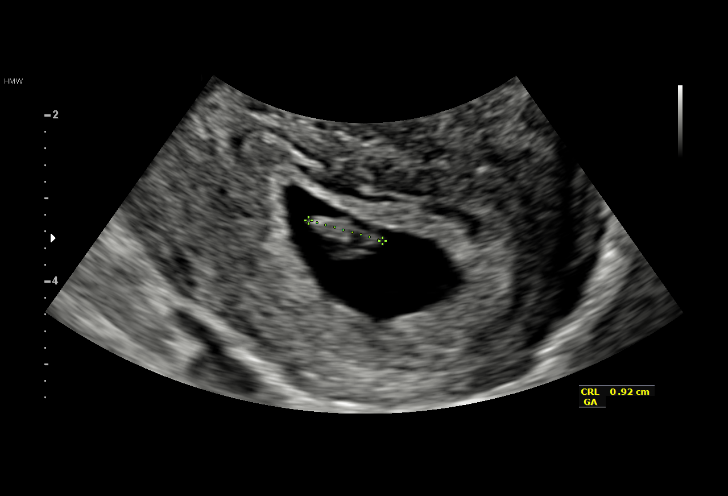

[15 of 28 positions shown; findings below may reference images not displayed]

FINDINGS: Intrauterine gestational sac: A single intrauterine pregnancy is
identified.

Yolk sac:  Yolk sac is present.

Embryo:  Fetal pole is present.

Cardiac Activity: Fetal cardiac activity is identified.

Heart Rate: 129 bpm

CRL:   9.8  mm   7 w 0 d                  US EDC: 07/02/2016

Subchorionic hemorrhage: A small subchorionic hemorrhage is noted
along the anterior margin.

Maternal uterus/adnexae: Uterus is retroverted. Right ovary is
visualized and appears normal. Left ovary is not seen. No free fluid
in the pelvis.
IMPRESSION: Single intrauterine pregnancy. Estimated gestational age by
crown-rump length is 7 weeks 0 days, representing appropriate growth
since the previous study. Small subchorionic hemorrhage is noted.

## 2015-11-14 MED ORDER — PROMETHAZINE HCL 25 MG PO TABS: 12.5000 mg | ORAL_TABLET | Freq: Four times a day (QID) | ORAL | 3 refills | Status: DC | PRN

## 2015-11-14 MED ORDER — PROMETHAZINE HCL 25 MG PO TABS
25.0000 mg | ORAL_TABLET | Freq: Once | ORAL | Status: AC
  Administered 2015-11-14: 25 mg via ORAL
  Filled 2015-11-14: qty 1

## 2015-11-14 NOTE — Discharge Instructions (Signed)
Subchorionic Hematoma °A subchorionic hematoma is a gathering of blood between the outer wall of the placenta and the inner wall of the womb (uterus). The placenta is the organ that connects the fetus to the wall of the uterus. The placenta performs the feeding, breathing (oxygen to the fetus), and waste removal (excretory work) of the fetus.  °Subchorionic hematoma is the most common abnormality found on a result from ultrasonography done during the first trimester or early second trimester of pregnancy. If there has been little or no vaginal bleeding, early small hematomas usually shrink on their own and do not affect your baby or pregnancy. The blood is gradually absorbed over 1-2 weeks. When bleeding starts later in pregnancy or the hematoma is larger or occurs in an older pregnant woman, the outcome may not be as good. Larger hematomas may get bigger, which increases the chances for miscarriage. Subchorionic hematoma also increases the risk of premature detachment of the placenta from the uterus, preterm (premature) labor, and stillbirth. °HOME CARE INSTRUCTIONS °· Stay on bed rest if your health care provider recommends this. Although bed rest will not prevent more bleeding or prevent a miscarriage, your health care provider may recommend bed rest until you are advised otherwise. °· Avoid heavy lifting (more than 10 lb [4.5 kg]), exercise, sexual intercourse, or douching as directed by your health care provider. °· Keep track of the number of pads you use each day and how soaked (saturated) they are. Write down this information. °· Do not use tampons. °· Keep all follow-up appointments as directed by your health care provider. Your health care provider may ask you to have follow-up blood tests or ultrasound tests or both. °SEEK IMMEDIATE MEDICAL CARE IF: °· You have severe cramps in your stomach, back, abdomen, or pelvis. °· You have a fever. °· You pass large clots or tissue. Save any tissue for your health  care provider to look at. °· Your bleeding increases or you become lightheaded, feel weak, or have fainting episodes. °  °This information is not intended to replace advice given to you by your health care provider. Make sure you discuss any questions you have with your health care provider. °  °Document Released: 05/26/2006 Document Revised: 03/01/2014 Document Reviewed: 09/07/2012 °Elsevier Interactive Patient Education ©2016 [employer] ° °

## 2015-11-14 NOTE — MAU Provider Note (Signed)
History     CSN: [id number]  Arrival date and time: 11/14/15 [id number]   First Provider Initiated Contact with Patient 11/14/15 [phone]      Chief Complaint  Patient presents with  . Vaginal Bleeding   Vaginal Bleeding  The patient's primary symptoms include pelvic pain and vaginal bleeding. This is a new problem. The current episode started today. The problem occurs constantly. The problem has been unchanged. Pain severity now: 7/10  The problem affects both sides. She is pregnant. Associated symptoms include abdominal pain, nausea and vomiting. Pertinent negatives include no chills, constipation, diarrhea, dysuria, fever, frequency or urgency. The vaginal discharge was bloody. The vaginal bleeding is typical of menses. She has been passing clots (about the size of a grape. ). Passing tissue: uncertain. Nothing aggravates the symptoms. The treatment provided no relief. Sexual activity: no intercourse in the last 24 hours.  Menstrual history: LMP 06/27/15     Past Medical History:  Diagnosis Date  . Anxiety   . Medical history non-contributory     Past Surgical History:  Procedure Laterality Date  . DILATION AND CURETTAGE OF UTERUS    . DILATION AND EVACUATION N/A 09/08/2015   Procedure: DILATATION AND EVACUATION;  Surgeon: Aletha Halim, MD;  Location: [facility];  Service: Gynecology;  Laterality: N/A;    Family History  Problem Relation Age of Onset  . Hypertension Mother   . Hypertension Father   . Diabetes Maternal Grandmother     Social History  Substance Use Topics  . Smoking status: Current Every Day Smoker    Packs/day: 0.25    Years: 10.00    Types: Cigarettes  . Smokeless tobacco: Never Used  . Alcohol use No    Allergies:  Allergies  Allergen Reactions  . Amoxicillin     rash  . Augmentin [Amoxicillin-Pot Clavulanate]   . Penicillins     Has patient had a PCN reaction causing immediate rash, facial/tongue/throat swelling, SOB or lightheadedness with hypotension:  No Has patient had a PCN reaction causing severe rash involving mucus membranes or skin necrosis: yes Has patient had a PCN reaction that required hospitalization No Has patient had a PCN reaction occurring within the last 10 years: No If all of the above answers are "NO", then may proceed with Cephalosporin use.   . Vicodin [Hydrocodone-Acetaminophen] Itching    Nausea and vomiting    Prescriptions Prior to Admission  Medication Sig Dispense Refill Last Dose  . ALPRAZolam (XANAX) 1 MG tablet Take 1 mg by mouth at bedtime as needed for anxiety.   09/08/2015 at Unknown time  . alprazolam (XANAX) 2 MG tablet Take 2 mg by mouth 2 times daily at 12 noon and 4 pm.   Taking  . loratadine (CLARITIN) 10 MG tablet Take 1 tablet (10 mg total) by mouth daily. 30 tablet 0   . metroNIDAZOLE (FLAGYL) 500 MG tablet Take 1 tablet (500 mg total) by mouth 2 (two) times daily. 14 tablet 0   . mometasone (NASONEX) 50 MCG/ACT nasal spray Place 2 sprays into the nose daily. 17 g 12   . Prenatal Vit-Fe Fumarate-FA (PREPLUS) 27-1 MG TABS Take 1 tablet by mouth daily. 30 tablet 13   . terconazole (TERAZOL 7) 0.4 % vaginal cream Place 1 applicator vaginally at bedtime. Use for seven days 45 g 0     Review of Systems  Constitutional: Negative for chills and fever.  Gastrointestinal: Positive for abdominal pain, nausea and vomiting. Negative for constipation and diarrhea.  Genitourinary: Positive for pelvic pain and vaginal bleeding. Negative for dysuria, frequency and urgency.   Physical Exam   Blood pressure 112/64, pulse 101, temperature 98 F (36.7 C), temperature source Oral, resp. rate 19, height 5\' 3"  (1.6 m), weight 150 lb (68 kg), last menstrual period 06/27/2015, SpO2 100 %, unknown if currently breastfeeding.  Physical Exam  Nursing note and vitals reviewed. Constitutional: She is oriented to person, place, and time. She appears well-developed and well-nourished. No distress.  HENT:  Head:  Normocephalic.  Cardiovascular: Normal rate.   Respiratory: Effort normal.  GI: Soft. There is no tenderness. There is no rebound.  Genitourinary:  Genitourinary Comments:  External: no lesion Vagina: small amount of bleeding seen  Cervix: pink, smooth, no CMT, closed Uterus: NSSC Adnexa: NT   Neurological: She is alert and oriented to person, place, and time.  Skin: Skin is warm and dry.  Psychiatric: She has a normal mood and affect.   Results for orders placed or performed during the hospital encounter of 11/14/15 (from the past 24 hour(s))  CBC     Status: Abnormal   Collection Time: 11/14/15  3:16 AM  Result Value Ref Range   WBC 11.3 (H) 4.0 - 10.5 K/uL   RBC 3.93 3.87 - 5.11 MIL/uL   Hemoglobin 12.4 12.0 - 15.0 g/dL   HCT 35.0 (L) 36.0 - 46.0 %   MCV 89.1 78.0 - 100.0 fL   MCH 31.6 26.0 - 34.0 pg   MCHC 35.4 30.0 - 36.0 g/dL   RDW 13.1 11.5 - 15.5 %   Platelets 291 150 - 400 K/uL   US Ob Transvaginal  Result Date: 11/14/2015 CLINICAL DATA:  Low abdominal cramping. Gestational age based on previous ultrasound is 7 weeks 0 days. Quantitative beta HCG is pending. EXAM: TRANSVAGINAL OB ULTRASOUND TECHNIQUE: Transvaginal ultrasound was performed for complete evaluation of the gestation as well as the maternal uterus, adnexal regions, and pelvic cul-de-sac. COMPARISON:  11/10/2015 FINDINGS: Intrauterine gestational sac: A single intrauterine pregnancy is identified. Yolk sac:  Yolk sac is present. Embryo:  Fetal pole is present. Cardiac Activity: Fetal cardiac activity is identified. Heart Rate: 129 bpm CRL:   9.8  mm   7 w 0 d                  [place] EDC: 07/02/2016 Subchorionic hemorrhage: A small subchorionic hemorrhage is noted along the anterior margin. Maternal uterus/adnexae: Uterus is retroverted. Right ovary is visualized and appears normal. Left ovary is not seen. No free fluid in the pelvis. IMPRESSION: Single intrauterine pregnancy. Estimated gestational age by crown-rump  length is 7 weeks 0 days, representing appropriate growth since the previous study. Small subchorionic hemorrhage is noted. Electronically Signed   By: Lucienne Capers M.D.   On: 11/14/2015 05:03    MAU Course  Procedures  MDM Phenergan given here in MAU   Assessment and Plan   1. Subchorionic hematoma in first trimester   2. Vaginal bleeding in pregnancy, first trimester   3. Intrauterine pregnancy    DC home Comfort measures reviewed  1stTrimester precautions  Bleeding precautions RX: Phenergan PRN #[id number] with 3 refills  Return to MAU as needed FU with OB as planned  Follow-up Information    [facility] .   Contact information: [place] [address] [phone]           Mathis Bud 11/14/2015, 3:27 AM

## 2015-11-14 NOTE — MAU Note (Signed)
Pt reports she was here the other day because she had some spotting, u/s was wnl. Tonight the bleeding increase a lot. Some lower abd cramping.

## 2015-11-20 ENCOUNTER — Inpatient Hospital Stay (HOSPITAL_COMMUNITY)
Admission: AD | Admit: 2015-11-20 | Discharge: 2015-11-21 | Disposition: A | Discharge status: Home / Self Care | Payer: Medicaid Other | Source: Ambulatory Visit | Attending: Obstetrics & Gynecology | Admitting: Obstetrics & Gynecology

## 2015-11-20 ENCOUNTER — Encounter (HOSPITAL_COMMUNITY): Payer: Self-pay

## 2015-11-20 DIAGNOSIS — O99331 Smoking (tobacco) complicating pregnancy, first trimester: Secondary | ICD-10-CM | POA: Diagnosis not present

## 2015-11-20 DIAGNOSIS — O209 Hemorrhage in early pregnancy, unspecified: Secondary | ICD-10-CM | POA: Diagnosis not present

## 2015-11-20 DIAGNOSIS — O43891 Other placental disorders, first trimester: Secondary | ICD-10-CM

## 2015-11-20 DIAGNOSIS — Z3A08 8 weeks gestation of pregnancy: Secondary | ICD-10-CM | POA: Insufficient documentation

## 2015-11-20 DIAGNOSIS — F17210 Nicotine dependence, cigarettes, uncomplicated: Secondary | ICD-10-CM | POA: Insufficient documentation

## 2015-11-20 LAB — URINE MICROSCOPIC-ADD ON

## 2015-11-20 LAB — URINALYSIS, ROUTINE W REFLEX MICROSCOPIC
Bilirubin Urine: NEGATIVE
Glucose, UA: NEGATIVE mg/dL
Ketones, ur: NEGATIVE mg/dL
NITRITE: NEGATIVE
PROTEIN: NEGATIVE mg/dL
pH: 6.5 (ref 5.0–8.0)

## 2015-11-20 NOTE — MAU Note (Signed)
Pt has known subchorionic hemorrhage. Bleeding has been more brownish and spotting but tonight started having heavier bleeding tonight while at work. Has a small amount of bright red blood on pad in triage. Has some lower abdominal tightening and pain that has been intermittent. Rates 4/10. Did not take anything for pain.

## 2015-11-21 NOTE — Discharge Instructions (Signed)
Subchorionic Hematoma °A subchorionic hematoma is a gathering of blood between the outer wall of the placenta and the inner wall of the womb (uterus). The placenta is the organ that connects the fetus to the wall of the uterus. The placenta performs the feeding, breathing (oxygen to the fetus), and waste removal (excretory work) of the fetus.  °Subchorionic hematoma is the most common abnormality found on a result from ultrasonography done during the first trimester or early second trimester of pregnancy. If there has been little or no vaginal bleeding, early small hematomas usually shrink on their own and do not affect your baby or pregnancy. The blood is gradually absorbed over 1-2 weeks. When bleeding starts later in pregnancy or the hematoma is larger or occurs in an older pregnant woman, the outcome may not be as good. Larger hematomas may get bigger, which increases the chances for miscarriage. Subchorionic hematoma also increases the risk of premature detachment of the placenta from the uterus, preterm (premature) labor, and stillbirth. °HOME CARE INSTRUCTIONS °· Stay on bed rest if your health care provider recommends this. Although bed rest will not prevent more bleeding or prevent a miscarriage, your health care provider may recommend bed rest until you are advised otherwise. °· Avoid heavy lifting (more than 10 lb [4.5 kg]), exercise, sexual intercourse, or douching as directed by your health care provider. °· Keep track of the number of pads you use each day and how soaked (saturated) they are. Write down this information. °· Do not use tampons. °· Keep all follow-up appointments as directed by your health care provider. Your health care provider may ask you to have follow-up blood tests or ultrasound tests or both. °SEEK IMMEDIATE MEDICAL CARE IF: °· You have severe cramps in your stomach, back, abdomen, or pelvis. °· You have a fever. °· You pass large clots or tissue. Save any tissue for your health  care provider to look at. °· Your bleeding increases or you become lightheaded, feel weak, or have fainting episodes. °  °This information is not intended to replace advice given to you by your health care provider. Make sure you discuss any questions you have with your health care provider. °  °Document Released: 05/26/2006 Document Revised: 03/01/2014 Document Reviewed: 09/07/2012 °Elsevier Interactive Patient Education ©2016 [employer] ° °

## 2015-11-21 NOTE — MAU Provider Note (Signed)
History     CSN: [id number]  Arrival date and time: 11/20/15 2311   First Provider Initiated Contact with Patient 11/21/15 0022      Chief Complaint  Patient presents with  . Vaginal Bleeding   Stephanie Yu is a 25 y.o. G3P0020 at [redacted]w[redacted]d who presents today with bleeding. She has known subchorionic hemorrhage.    Vaginal Bleeding  The patient's primary symptoms include pelvic pain and vaginal bleeding. This is a new problem. The current episode started today. The problem occurs constantly. The problem has been unchanged. The pain is moderate. The problem affects both sides. She is pregnant. The vaginal discharge was bloody. The vaginal bleeding is typical of menses. She has not been passing clots. She has not been passing tissue. Nothing aggravates the symptoms. She has tried nothing for the symptoms.   Past Medical History:  Diagnosis Date  . Anxiety   . Medical history non-contributory     Past Surgical History:  Procedure Laterality Date  . DILATION AND CURETTAGE OF UTERUS    . DILATION AND EVACUATION N/A 09/08/2015   Procedure: DILATATION AND EVACUATION;  Surgeon: Aletha Halim, MD;  Location: [facility];  Service: Gynecology;  Laterality: N/A;    Family History  Problem Relation Age of Onset  . Hypertension Mother   . Hypertension Father   . Diabetes Maternal Grandmother     Social History  Substance Use Topics  . Smoking status: Current Every Day Smoker    Packs/day: 0.25    Years: 10.00    Types: Cigarettes  . Smokeless tobacco: Never Used  . Alcohol use No    Allergies:  Allergies  Allergen Reactions  . Amoxicillin     rash  . Augmentin [Amoxicillin-Pot Clavulanate]   . Penicillins     Has patient had a PCN reaction causing immediate rash, facial/tongue/throat swelling, SOB or lightheadedness with hypotension: No Has patient had a PCN reaction causing severe rash involving mucus membranes or skin necrosis: yes Has patient had a PCN reaction that required  hospitalization No Has patient had a PCN reaction occurring within the last 10 years: No If all of the above answers are "NO", then may proceed with Cephalosporin use.   . Vicodin [Hydrocodone-Acetaminophen] Itching    Nausea and vomiting    Prescriptions Prior to Admission  Medication Sig Dispense Refill Last Dose  . metroNIDAZOLE (FLAGYL) 500 MG tablet Take 1 tablet (500 mg total) by mouth 2 (two) times daily. 14 tablet 0 11/20/2015 at Unknown time  . Prenatal Vit-Fe Fumarate-FA (PREPLUS) 27-1 MG TABS Take 1 tablet by mouth daily. 30 tablet 13 11/20/2015 at Unknown time  . terconazole (TERAZOL 7) 0.4 % vaginal cream Place 1 applicator vaginally at bedtime. Use for seven days 45 g 0 11/20/2015 at Unknown time  . ALPRAZolam (XANAX) 1 MG tablet Take 1 mg by mouth at bedtime as needed for anxiety.   09/08/2015 at Unknown time  . alprazolam (XANAX) 2 MG tablet Take 2 mg by mouth 2 times daily at 12 noon and 4 pm.   Taking  . loratadine (CLARITIN) 10 MG tablet Take 1 tablet (10 mg total) by mouth daily. 30 tablet 0   . mometasone (NASONEX) 50 MCG/ACT nasal spray Place 2 sprays into the nose daily. 17 g 12   . promethazine (PHENERGAN) 25 MG tablet Take 0.5-1 tablets (12.5-25 mg total) by mouth every 6 (six) hours as needed. 12 tablet 3     Review of Systems  Genitourinary: Positive for  pelvic pain and vaginal bleeding.   Physical Exam   Blood pressure 110/71, pulse 116, temperature 98.5 F (36.9 C), temperature source Oral, resp. rate 20, height 5\' 3"  (1.6 m), weight 153 lb (69.4 kg), last menstrual period 06/27/2015, SpO2 100 %, unknown if currently breastfeeding.  Physical Exam  Nursing note and vitals reviewed. Constitutional: She is oriented to person, place, and time. She appears well-developed and well-nourished. No distress.  HENT:  Head: Normocephalic.  Cardiovascular: Normal rate.   Respiratory: Effort normal.  GI: Soft. There is no tenderness. There is no rebound.  Neurological:  She is alert and oriented to person, place, and time.  Skin: Skin is warm and dry.  Psychiatric: She has a normal mood and affect.   Bedside US: CRL 8 weeks with +cardiac activity MAU Course  Procedures  MDM   Assessment and Plan   1. Subchorionic hematoma, first trimester   2. Vaginal bleeding before [redacted] weeks gestation    DC home Comfort measures reviewed  1st Trimester precautions  Bleeding precautions RX: none  Return to MAU as needed FU with OB as planned     Mathis Bud 11/21/2015, 12:23 AM

## 2015-12-08 ENCOUNTER — Encounter: Payer: Self-pay | Admitting: Family Medicine

## 2015-12-08 ENCOUNTER — Other Ambulatory Visit (HOSPITAL_COMMUNITY)
Admission: RE | Admit: 2015-12-08 | Discharge: 2015-12-08 | Disposition: A | Discharge status: Home / Self Care | Payer: Medicaid Other | Source: Ambulatory Visit | Attending: Family Medicine | Admitting: Family Medicine

## 2015-12-08 ENCOUNTER — Encounter: Payer: Self-pay | Admitting: Obstetrics

## 2015-12-08 ENCOUNTER — Ambulatory Visit (INDEPENDENT_AMBULATORY_CARE_PROVIDER_SITE_OTHER): Payer: Medicaid Other | Admitting: Family Medicine

## 2015-12-08 VITALS — BP 112/75 | HR 102 | Temp 97.5°F | Wt 158.7 lb

## 2015-12-08 DIAGNOSIS — Z113 Encounter for screening for infections with a predominantly sexual mode of transmission: Secondary | ICD-10-CM | POA: Insufficient documentation

## 2015-12-08 DIAGNOSIS — J301 Allergic rhinitis due to pollen: Secondary | ICD-10-CM

## 2015-12-08 DIAGNOSIS — Z23 Encounter for immunization: Secondary | ICD-10-CM | POA: Diagnosis not present

## 2015-12-08 DIAGNOSIS — N760 Acute vaginitis: Secondary | ICD-10-CM | POA: Diagnosis present

## 2015-12-08 DIAGNOSIS — O99341 Other mental disorders complicating pregnancy, first trimester: Secondary | ICD-10-CM

## 2015-12-08 DIAGNOSIS — O99351 Diseases of the nervous system complicating pregnancy, first trimester: Secondary | ICD-10-CM | POA: Diagnosis not present

## 2015-12-08 DIAGNOSIS — O0991 Supervision of high risk pregnancy, unspecified, first trimester: Secondary | ICD-10-CM

## 2015-12-08 DIAGNOSIS — Z01419 Encounter for gynecological examination (general) (routine) without abnormal findings: Secondary | ICD-10-CM | POA: Insufficient documentation

## 2015-12-08 DIAGNOSIS — Z3481 Encounter for supervision of other normal pregnancy, first trimester: Secondary | ICD-10-CM | POA: Diagnosis not present

## 2015-12-08 DIAGNOSIS — F410 Panic disorder [episodic paroxysmal anxiety] without agoraphobia: Secondary | ICD-10-CM

## 2015-12-08 DIAGNOSIS — O99511 Diseases of the respiratory system complicating pregnancy, first trimester: Secondary | ICD-10-CM

## 2015-12-08 DIAGNOSIS — Z124 Encounter for screening for malignant neoplasm of cervix: Secondary | ICD-10-CM

## 2015-12-08 DIAGNOSIS — O0990 Supervision of high risk pregnancy, unspecified, unspecified trimester: Secondary | ICD-10-CM | POA: Insufficient documentation

## 2015-12-08 DIAGNOSIS — G40909 Epilepsy, unspecified, not intractable, without status epilepticus: Secondary | ICD-10-CM | POA: Insufficient documentation

## 2015-12-08 DIAGNOSIS — J4520 Mild intermittent asthma, uncomplicated: Secondary | ICD-10-CM

## 2015-12-08 DIAGNOSIS — F411 Generalized anxiety disorder: Secondary | ICD-10-CM

## 2015-12-08 DIAGNOSIS — J309 Allergic rhinitis, unspecified: Secondary | ICD-10-CM | POA: Insufficient documentation

## 2015-12-08 NOTE — Patient Instructions (Signed)
First Trimester of Pregnancy The first trimester of pregnancy is from week 1 until the end of week 12 (months 1 through 3). A week after a sperm fertilizes an egg, the egg will implant on the wall of the uterus. This embryo will begin to develop into a baby. Genes from you and your partner are forming the baby. The female genes determine whether the baby is a boy or a girl. At 6-8 weeks, the eyes and face are formed, and the heartbeat can be seen on ultrasound. At the end of 12 weeks, all the baby's organs are formed.  Now that you are pregnant, you will want to do everything you can to have a healthy baby. Two of the most important things are to get good prenatal care and to follow your health care provider's instructions. Prenatal care is all the medical care you receive before the baby's birth. This care will help prevent, find, and treat any problems during the pregnancy and childbirth. BODY CHANGES Your body goes through many changes during pregnancy. The changes vary from woman to woman.   You may gain or lose a couple of pounds at first.  You may feel sick to your stomach (nauseous) and throw up (vomit). If the vomiting is uncontrollable, call your health care provider.  You may tire easily.  You may develop headaches that can be relieved by medicines approved by your health care provider.  You may urinate more often. Painful urination may mean you have a bladder infection.  You may develop heartburn as a result of your pregnancy.  You may develop constipation because certain hormones are causing the muscles that push waste through your intestines to slow down.  You may develop hemorrhoids or swollen, bulging veins (varicose veins).  Your breasts may begin to grow larger and become tender. Your nipples may stick out more, and the tissue that surrounds them (areola) may become darker.  Your gums may bleed and may be sensitive to brushing and flossing.  Dark spots or blotches  (chloasma, mask of pregnancy) may develop on your face. This will likely fade after the baby is born.  Your menstrual periods will stop.  You may have a loss of appetite.  You may develop cravings for certain kinds of food.  You may have changes in your emotions from day to day, such as being excited to be pregnant or being concerned that something may go wrong with the pregnancy and baby.  You may have more vivid and strange dreams.  You may have changes in your hair. These can include thickening of your hair, rapid growth, and changes in texture. Some women also have hair loss during or after pregnancy, or hair that feels dry or thin. Your hair will most likely return to normal after your baby is born. WHAT TO EXPECT AT YOUR PRENATAL VISITS During a routine prenatal visit:  You will be weighed to make sure you and the baby are growing normally.  Your blood pressure will be taken.  Your abdomen will be measured to track your baby's growth.  The fetal heartbeat will be listened to starting around week 10 or 12 of your pregnancy.  Test results from any previous visits will be discussed. Your health care provider may ask you:  How you are feeling.  If you are feeling the baby move.  If you have had any abnormal symptoms, such as leaking fluid, bleeding, severe headaches, or abdominal cramping.  If you are using any tobacco  products, including cigarettes, chewing tobacco, and electronic cigarettes.  If you have any questions. Other tests that may be performed during your first trimester include:  Blood tests to find your blood type and to check for the presence of any previous infections. They will also be used to check for low iron levels (anemia) and Rh antibodies. Later in the pregnancy, blood tests for diabetes will be done along with other tests if problems develop.  Urine tests to check for infections, diabetes, or protein in the urine.  An ultrasound to confirm the  proper growth and development of the baby.  An amniocentesis to check for possible genetic problems.  Fetal screens for spina bifida and Down syndrome.  You may need other tests to make sure you and the baby are doing well.  HIV (human immunodeficiency virus) testing. Routine prenatal testing includes screening for HIV, unless you choose not to have this test. HOME CARE INSTRUCTIONS  Medicines  Follow your health care provider's instructions regarding medicine use. Specific medicines may be either safe or unsafe to take during pregnancy.  Take your prenatal vitamins as directed.  If you develop constipation, try taking a stool softener if your health care provider approves. Diet  Eat regular, well-balanced meals. Choose a variety of foods, such as meat or vegetable-based protein, fish, milk and low-fat dairy products, vegetables, fruits, and whole grain breads and cereals. Your health care provider will help you determine the amount of weight gain that is right for you.  Avoid raw meat and uncooked cheese. These carry germs that can cause birth defects in the baby.  Eating four or five small meals rather than three large meals a day may help relieve nausea and vomiting. If you start to feel nauseous, eating a few soda crackers can be helpful. Drinking liquids between meals instead of during meals also seems to help nausea and vomiting.  If you develop constipation, eat more high-fiber foods, such as fresh vegetables or fruit and whole grains. Drink enough fluids to keep your urine clear or pale yellow. Activity and Exercise  Exercise only as directed by your health care provider. Exercising will help you:  Control your weight.  Stay in shape.  Be prepared for labor and delivery.  Experiencing pain or cramping in the lower abdomen or low back is a good sign that you should stop exercising. Check with your health care provider before continuing normal exercises.  Try to avoid  standing for long periods of time. Move your legs often if you must stand in one place for a long time.  Avoid heavy lifting.  Wear low-heeled shoes, and practice good posture.  You may continue to have sex unless your health care provider directs you otherwise. Relief of Pain or Discomfort  Wear a good support bra for breast tenderness.   Take warm sitz baths to soothe any pain or discomfort caused by hemorrhoids. Use hemorrhoid cream if your health care provider approves.   Rest with your legs elevated if you have leg cramps or low back pain.  If you develop varicose veins in your legs, wear support hose. Elevate your feet for 15 minutes, 3-4 times a day. Limit salt in your diet. Prenatal Care  Schedule your prenatal visits by the twelfth week of pregnancy. They are usually scheduled monthly at first, then more often in the last 2 months before delivery.  Write down your questions. Take them to your prenatal visits.  Keep all your prenatal visits as directed by  your health care provider. Safety  Wear your seat belt at all times when driving.  Make a list of emergency phone numbers, including numbers for family, friends, the hospital, and police and fire departments. General Tips  Ask your health care provider for a referral to a local prenatal education class. Begin classes no later than at the beginning of month 6 of your pregnancy.  Ask for help if you have counseling or nutritional needs during pregnancy. Your health care provider can offer advice or refer you to specialists for help with various needs.  Do not use hot tubs, steam rooms, or saunas.  Do not douche or use tampons or scented sanitary pads.  Do not cross your legs for long periods of time.  Avoid cat litter boxes and soil used by cats. These carry germs that can cause birth defects in the baby and possibly loss of the fetus by miscarriage or stillbirth.  Avoid all smoking, herbs, alcohol, and medicines  not prescribed by your health care provider. Chemicals in these affect the formation and growth of the baby.  Do not use any tobacco products, including cigarettes, chewing tobacco, and electronic cigarettes. If you need help quitting, ask your health care provider. You may receive counseling support and other resources to help you quit.  Schedule a dentist appointment. At home, brush your teeth with a soft toothbrush and be gentle when you floss. SEEK MEDICAL CARE IF:   You have dizziness.  You have mild pelvic cramps, pelvic pressure, or nagging pain in the abdominal area.  You have persistent nausea, vomiting, or diarrhea.  You have a bad smelling vaginal discharge.  You have pain with urination.  You notice increased swelling in your face, hands, legs, or ankles. SEEK IMMEDIATE MEDICAL CARE IF:   You have a fever.  You are leaking fluid from your vagina.  You have spotting or bleeding from your vagina.  You have severe abdominal cramping or pain.  You have rapid weight gain or loss.  You vomit blood or material that looks like coffee grounds.  You are exposed to [place] measles and have never had them.  You are exposed to fifth disease or chickenpox.  You develop a severe headache.  You have shortness of breath.  You have any kind of trauma, such as from a fall or a car accident.   This information is not intended to replace advice given to you by your health care provider. Make sure you discuss any questions you have with your health care provider.   Document Released: 02/02/2001 Document Revised: 03/01/2014 Document Reviewed: 12/19/2012 Elsevier Interactive Patient Education [employer].   Breastfeeding Deciding to breastfeed is one of the best choices you can make for you and your baby. A change in hormones during pregnancy causes your breast tissue to grow and increases the number and size of your milk ducts. These hormones also allow proteins, sugars,  and fats from your blood supply to make breast milk in your milk-producing glands. Hormones prevent breast milk from being released before your baby is born as well as prompt milk flow after birth. Once breastfeeding has begun, thoughts of your baby, as well as his or her sucking or crying, can stimulate the release of milk from your milk-producing glands.  BENEFITS OF BREASTFEEDING For Your Baby  Your first milk (colostrum) helps your baby's digestive system function better.  There are antibodies in your milk that help your baby fight off infections.  Your baby has  a lower incidence of asthma, allergies, and sudden infant death syndrome.  The nutrients in breast milk are better for your baby than infant formulas and are designed uniquely for your baby's needs.  Breast milk improves your baby's brain development.  Your baby is less likely to develop other conditions, such as childhood obesity, asthma, or type 2 diabetes mellitus. For You  Breastfeeding helps to create a very special bond between you and your baby.  Breastfeeding is convenient. Breast milk is always available at the correct temperature and costs nothing.  Breastfeeding helps to burn calories and helps you lose the weight gained during pregnancy.  Breastfeeding makes your uterus contract to its prepregnancy size faster and slows bleeding (lochia) after you give birth.   Breastfeeding helps to lower your risk of developing type 2 diabetes mellitus, osteoporosis, and breast or ovarian cancer later in life. SIGNS THAT YOUR BABY IS HUNGRY Early Signs of Hunger  Increased alertness or activity.  Stretching.  Movement of the head from side to side.  Movement of the head and opening of the mouth when the corner of the mouth or cheek is stroked (rooting).  Increased sucking sounds, smacking lips, cooing, sighing, or squeaking.  Hand-to-mouth movements.  Increased sucking of fingers or hands. Late Signs of  Hunger  Fussing.  Intermittent crying. Extreme Signs of Hunger Signs of extreme hunger will require calming and consoling before your baby will be able to breastfeed successfully. Do not wait for the following signs of extreme hunger to occur before you initiate breastfeeding:  Restlessness.  A loud, strong cry.  Screaming. BREASTFEEDING BASICS Breastfeeding Initiation  Find a comfortable place to sit or lie down, with your neck and back well supported.  Place a pillow or rolled up blanket under your baby to bring him or her to the level of your breast (if you are seated). Nursing pillows are specially designed to help support your arms and your baby while you breastfeed.  Make sure that your baby's abdomen is facing your abdomen.  Gently massage your breast. With your fingertips, massage from your chest wall toward your nipple in a circular motion. This encourages milk flow. You may need to continue this action during the feeding if your milk flows slowly.  Support your breast with 4 fingers underneath and your thumb above your nipple. Make sure your fingers are well away from your nipple and your baby's mouth.  Stroke your baby's lips gently with your finger or nipple.  When your baby's mouth is open wide enough, quickly bring your baby to your breast, placing your entire nipple and as much of the colored area around your nipple (areola) as possible into your baby's mouth.  More areola should be visible above your baby's upper lip than below the lower lip.  Your baby's tongue should be between his or her lower gum and your breast.  Ensure that your baby's mouth is correctly positioned around your nipple (latched). Your baby's lips should create a seal on your breast and be turned out (everted).  It is common for your baby to suck about 2-3 minutes in order to start the flow of breast milk. Latching Teaching your baby how to latch on to your breast properly is very important.  An improper latch can cause nipple pain and decreased milk supply for you and poor weight gain in your baby. Also, if your baby is not latched onto your nipple properly, he or she may swallow some air during feeding. This  can make your baby fussy. Burping your baby when you switch breasts during the feeding can help to get rid of the air. However, teaching your baby to latch on properly is still the best way to prevent fussiness from swallowing air while breastfeeding. Signs that your baby has successfully latched on to your nipple:  Silent tugging or silent sucking, without causing you pain.  Swallowing heard between every 3-4 sucks.  Muscle movement above and in front of his or her ears while sucking. Signs that your baby has not successfully latched on to nipple:  Sucking sounds or smacking sounds from your baby while breastfeeding.  Nipple pain. If you think your baby has not latched on correctly, slip your finger into the corner of your baby's mouth to break the suction and place it between your baby's gums. Attempt breastfeeding initiation again. Signs of Successful Breastfeeding Signs from your baby:  A gradual decrease in the number of sucks or complete cessation of sucking.  Falling asleep.  Relaxation of his or her body.  Retention of a small amount of milk in his or her mouth.  Letting go of your breast by himself or herself. Signs from you:  Breasts that have increased in firmness, weight, and size 1-3 hours after feeding.  Breasts that are softer immediately after breastfeeding.  Increased milk volume, as well as a change in milk consistency and color by the fifth day of breastfeeding.  Nipples that are not sore, cracked, or bleeding. Signs That Your [employer] is Getting Enough Milk  Wetting at least 3 diapers in a 24-hour period. The urine should be clear and pale yellow by age 594 days.  At least 3 stools in a 24-hour period by age 594 days. The stool should be soft and  yellow.  At least 3 stools in a 24-hour period by age 78 days. The stool should be seedy and yellow.  No loss of weight greater than 10% of birth weight during the first 75 days of age.  Average weight gain of 4-7 ounces (113-198 g) per week after age 59 days.  Consistent daily weight gain by age 31 days, without weight loss after the age of 2 weeks. After a feeding, your baby may spit up a small amount. This is common. BREASTFEEDING FREQUENCY AND DURATION Frequent feeding will help you make more milk and can prevent sore nipples and breast engorgement. Breastfeed when you feel the need to reduce the fullness of your breasts or when your baby shows signs of hunger. This is called "breastfeeding on demand." Avoid introducing a pacifier to your baby while you are working to establish breastfeeding (the first 4-6 weeks after your baby is born). After this time you may choose to use a pacifier. Research has shown that pacifier use during the first year of a baby's life decreases the risk of sudden infant death syndrome (SIDS). Allow your baby to feed on each breast as long as he or she wants. Breastfeed until your baby is finished feeding. When your baby unlatches or falls asleep while feeding from the first breast, offer the second breast. Because newborns are often sleepy in the first few weeks of life, you may need to awaken your baby to get him or her to feed. Breastfeeding times will vary from baby to baby. However, the following rules can serve as a guide to help you ensure that your baby is properly fed:  Newborns (babies 57 weeks of age or younger) may breastfeed every 1-3 hours.  Newborns should not go longer than 3 hours during the day or 5 hours during the night without breastfeeding.  You should breastfeed your baby a minimum of 8 times in a 24-hour period until you begin to introduce solid foods to your baby at around 25 months of age. BREAST MILK PUMPING Pumping and storing breast milk  allows you to ensure that your baby is exclusively fed your breast milk, even at times when you are unable to breastfeed. This is especially important if you are going back to work while you are still breastfeeding or when you are not able to be present during feedings. Your lactation consultant can give you guidelines on how long it is safe to store breast milk. A breast pump is a machine that allows you to pump milk from your breast into a sterile bottle. The pumped breast milk can then be stored in a refrigerator or freezer. Some breast pumps are operated by hand, while others use electricity. Ask your lactation consultant which type will work best for you. Breast pumps can be purchased, but some hospitals and breastfeeding support groups lease breast pumps on a monthly basis. A lactation consultant can teach you how to hand express breast milk, if you prefer not to use a pump. CARING FOR YOUR BREASTS WHILE YOU BREASTFEED Nipples can become dry, cracked, and sore while breastfeeding. The following recommendations can help keep your breasts moisturized and healthy:  Avoid using soap on your nipples.  Wear a supportive bra. Although not required, special nursing bras and tank tops are designed to allow access to your breasts for breastfeeding without taking off your entire bra or top. Avoid wearing underwire-style bras or extremely tight bras.  Air dry your nipples for 3-25minutes after each feeding.  Use only cotton bra pads to absorb leaked breast milk. Leaking of breast milk between feedings is normal.  Use lanolin on your nipples after breastfeeding. Lanolin helps to maintain your skin's normal moisture barrier. If you use pure lanolin, you do not need to wash it off before feeding your baby again. Pure lanolin is not toxic to your baby. You may also hand express a few drops of breast milk and gently massage that milk into your nipples and allow the milk to air dry. In the first few weeks after  giving birth, some women experience extremely full breasts (engorgement). Engorgement can make your breasts feel heavy, warm, and tender to the touch. Engorgement peaks within 3-5 days after you give birth. The following recommendations can help ease engorgement:  Completely empty your breasts while breastfeeding or pumping. You may want to start by applying warm, moist heat (in the shower or with warm water-soaked hand towels) just before feeding or pumping. This increases circulation and helps the milk flow. If your baby does not completely empty your breasts while breastfeeding, pump any extra milk after he or she is finished.  Wear a snug bra (nursing or regular) or tank top for 1-2 days to signal your body to slightly decrease milk production.  Apply ice packs to your breasts, unless this is too uncomfortable for you.  Make sure that your baby is latched on and positioned properly while breastfeeding. If engorgement persists after 48 hours of following these recommendations, contact your health care provider or a Science writer. OVERALL HEALTH CARE RECOMMENDATIONS WHILE BREASTFEEDING  Eat healthy foods. Alternate between meals and snacks, eating 3 of each per day. Because what you eat affects your breast milk, some of the foods  may make your baby more irritable than usual. Avoid eating these foods if you are sure that they are negatively affecting your baby.  Drink milk, fruit juice, and water to satisfy your thirst (about 10 glasses a day).  Rest often, relax, and continue to take your prenatal vitamins to prevent fatigue, stress, and anemia.  Continue breast self-awareness checks.  Avoid chewing and smoking tobacco. Chemicals from cigarettes that pass into breast milk and exposure to secondhand smoke may harm your baby.  Avoid alcohol and drug use, including marijuana. Some medicines that may be harmful to your baby can pass through breast milk. It is important to ask your health  care provider before taking any medicine, including all over-the-counter and prescription medicine as well as vitamin and herbal supplements. It is possible to become pregnant while breastfeeding. If birth control is desired, ask your health care provider about options that will be safe for your baby. SEEK MEDICAL CARE IF:  You feel like you want to stop breastfeeding or have become frustrated with breastfeeding.  You have painful breasts or nipples.  Your nipples are cracked or bleeding.  Your breasts are red, tender, or warm.  You have a swollen area on either breast.  You have a fever or chills.  You have nausea or vomiting.  You have drainage other than breast milk from your nipples.  Your breasts do not become full before feedings by the fifth day after you give birth.  You feel sad and depressed.  Your baby is too sleepy to eat well.  Your baby is having trouble sleeping.   Your baby is wetting less than 3 diapers in a 24-hour period.  Your baby has less than 3 stools in a 24-hour period.  Your baby's skin or the white part of his or her eyes becomes yellow.   Your baby is not gaining weight by 45 days of age. SEEK IMMEDIATE MEDICAL CARE IF:  Your baby is overly tired (lethargic) and does not want to wake up and feed.  Your baby develops an unexplained fever.   This information is not intended to replace advice given to you by your health care provider. Make sure you discuss any questions you have with your health care provider.   Document Released: 02/08/2005 Document Revised: 10/30/2014 Document Reviewed: 08/02/2012 Elsevier Interactive Patient Education [employer].

## 2015-12-08 NOTE — Progress Notes (Signed)
      Subjective:    Stephanie Yu is a G4P0020 [redacted]w[redacted]d being seen today for her first obstetrical visit.  Her obstetrical history is significant for sizure disorder.. Pregnancy history fully reviewed.  Patient reports nausea.  Vitals:   12/08/15 1043  BP: 112/75  Pulse: (!) 102  Temp: 97.5 F (36.4 C)  Weight: 158 lb 11.2 oz (72 kg)    HISTORY: OB History  Gravida Para Term Preterm AB Living  4 0 0 0 2    SAB TAB Ectopic Multiple Live Births  1 1 0        # Outcome Date GA Lbr Len/2nd Weight Sex Delivery Anes PTL Lv  4 Current           3 Gravida           2 SAB           1 TAB              Past Medical History:  Diagnosis Date  . Anxiety   . Medical history non-contributory    Past Surgical History:  Procedure Laterality Date  . DILATION AND CURETTAGE OF UTERUS    . DILATION AND EVACUATION N/A 09/08/2015   Procedure: DILATATION AND EVACUATION;  Surgeon: Aletha Halim, MD;  Location: [facility];  Service: Gynecology;  Laterality: N/A;   Family History  Problem Relation Age of Onset  . Hypertension Mother   . Hypertension Father   . Diabetes Maternal Grandmother      Exam    Uterus:   10 wk size  Pelvic Exam:    Perineum: Normal Perineum   Vulva: Bartholin's, Urethra, Skene's normal   Vagina:  normal mucosa, normal discharge   Cervix: no cervical motion tenderness, no lesions and nulliparous appearance   Adnexa: normal adnexa   Bony Pelvis: average  System: Breast:  normal appearance, no masses or tenderness   Skin: normal coloration and turgor, no rashes    Neurologic: oriented   Extremities: normal strength, tone, and muscle mass   HEENT extra ocular movement intact and sclera clear, anicteric   Mouth/Teeth mucous membranes moist, pharynx normal without lesions   Neck supple   Cardiovascular: regular rate and rhythm, no murmurs or gallops   Respiratory:  appears well, vitals normal, no respiratory distress, acyanotic, normal RR, ear and throat exam  is normal, neck free of mass or lymphadenopathy, chest clear, no wheezing, crepitations, rhonchi, normal symmetric air entry   Abdomen: soft, non-tender; bowel sounds normal; no masses,  no organomegaly      AssessmentPlan:   1. Seizure disorder ([place]) Has been on Gabapentin in the past--Last seizure 2 years ago - Ambulatory referral to Neurology  2. Supervision of high risk pregnancy in first trimester New OB labs - HIV antibody - Prenatal Profile I - Cystic Fibrosis Mutation 97 - ToxASSURE Select 13 (MW), Urine - [place] MFM Fetal Nuchal Translucency; Future - Urine cytology ancillary only - Culture, OB Urine - Cytology - PAP - Cervicovaginal ancillary only  3. Chronic seasonal allergic rhinitis due to pollen Claritin and Nasonex during spring, pollen  4. Mild intermittent asthma without complication Better since quit smoking  5. Panic anxiety syndrome Wean Xanax--risk of addiction discussed with patient.  6. Encounter for immunization - Flu Vaccine QUAD 36+ mos IM   Donnamae Jude 12/08/2015

## 2015-12-09 LAB — CERVICOVAGINAL ANCILLARY ONLY
Chlamydia: NEGATIVE
Neisseria Gonorrhea: NEGATIVE
Trichomonas: NEGATIVE

## 2015-12-09 LAB — CYTOLOGY - PAP: DIAGNOSIS: NEGATIVE

## 2015-12-09 LAB — URINE CYTOLOGY ANCILLARY ONLY
Chlamydia: NEGATIVE
Neisseria Gonorrhea: NEGATIVE

## 2015-12-10 LAB — CERVICOVAGINAL ANCILLARY ONLY
Bacterial vaginitis: NEGATIVE
CANDIDA VAGINITIS: NEGATIVE

## 2015-12-11 LAB — URINE CULTURE, OB REFLEX

## 2015-12-11 LAB — CULTURE, OB URINE

## 2015-12-15 ENCOUNTER — Encounter: Payer: Self-pay | Admitting: *Deleted

## 2015-12-15 DIAGNOSIS — O0990 Supervision of high risk pregnancy, unspecified, unspecified trimester: Secondary | ICD-10-CM

## 2015-12-15 LAB — OB RESULTS CONSOLE ABO/RH: RH Type: POSITIVE

## 2015-12-15 LAB — OB RESULTS CONSOLE HGB/HCT, BLOOD
HCT: 37 %
Hemoglobin: 12.4 g/dL

## 2015-12-15 LAB — OB RESULTS CONSOLE PLATELET COUNT: Platelets: 252 10*3/uL

## 2015-12-15 LAB — HIV ANTIBODY (ROUTINE TESTING W REFLEX): HIV: NONREACTIVE

## 2015-12-15 LAB — OB RESULTS CONSOLE HEPATITIS B SURFACE ANTIGEN: HEP B S AG: NEGATIVE

## 2015-12-15 LAB — OB RESULTS CONSOLE HIV ANTIBODY (ROUTINE TESTING): HIV: NONREACTIVE

## 2015-12-15 LAB — OB RESULTS CONSOLE RUBELLA ANTIBODY, IGM: RUBELLA: IMMUNE

## 2015-12-15 LAB — OB RESULTS CONSOLE RPR: RPR: NONREACTIVE

## 2015-12-16 ENCOUNTER — Encounter: Payer: Self-pay | Admitting: Family Medicine

## 2015-12-16 DIAGNOSIS — O09899 Supervision of other high risk pregnancies, unspecified trimester: Secondary | ICD-10-CM | POA: Insufficient documentation

## 2015-12-16 DIAGNOSIS — Z141 Cystic fibrosis carrier: Secondary | ICD-10-CM

## 2015-12-16 LAB — PRENATAL PROFILE I(LABCORP)
Antibody Screen: NEGATIVE
BASOS ABS: 0 10*3/uL (ref 0.0–0.2)
BASOS: 0 %
EOS (ABSOLUTE): 0.4 10*3/uL (ref 0.0–0.4)
Eos: 5 %
HEMATOCRIT: 36.9 % (ref 34.0–46.6)
HEMOGLOBIN: 12.4 g/dL (ref 11.1–15.9)
Hepatitis B Surface Ag: NEGATIVE
IMMATURE GRANS (ABS): 0 10*3/uL (ref 0.0–0.1)
IMMATURE GRANULOCYTES: 0 %
LYMPHS: 18 %
Lymphocytes Absolute: 1.4 10*3/uL (ref 0.7–3.1)
MCH: 31.6 pg (ref 26.6–33.0)
MCHC: 33.6 g/dL (ref 31.5–35.7)
MCV: 94 fL (ref 79–97)
MONOCYTES: 6 %
MONOS ABS: 0.5 10*3/uL (ref 0.1–0.9)
NEUTROS PCT: 71 %
Neutrophils Absolute: 5.6 10*3/uL (ref 1.4–7.0)
Platelets: 252 10*3/uL (ref 150–379)
RBC: 3.93 x10E6/uL (ref 3.77–5.28)
RDW: 13.3 % (ref 12.3–15.4)
RPR: NONREACTIVE
Rh Factor: POSITIVE
Rubella Antibodies, IGG: 3.14 index (ref >0.99)
WBC: 7.9 10*3/uL (ref 3.4–10.8)

## 2015-12-16 LAB — CYSTIC FIBROSIS MUTATION 97

## 2015-12-16 LAB — HIV ANTIBODY (ROUTINE TESTING W REFLEX): HIV SCREEN 4TH GENERATION: NONREACTIVE

## 2015-12-16 LAB — TOXASSURE SELECT 13 (MW), URINE

## 2015-12-17 ENCOUNTER — Encounter (HOSPITAL_COMMUNITY): Payer: Self-pay | Admitting: Family Medicine

## 2015-12-26 ENCOUNTER — Encounter (HOSPITAL_COMMUNITY): Payer: Self-pay

## 2015-12-26 ENCOUNTER — Ambulatory Visit (HOSPITAL_COMMUNITY)
Admission: RE | Admit: 2015-12-26 | Discharge: 2015-12-26 | Disposition: A | Discharge status: Home / Self Care | Payer: Medicaid Other | Source: Ambulatory Visit | Attending: Family Medicine | Admitting: Family Medicine

## 2015-12-26 DIAGNOSIS — Z141 Cystic fibrosis carrier: Secondary | ICD-10-CM

## 2015-12-26 DIAGNOSIS — O0990 Supervision of high risk pregnancy, unspecified, unspecified trimester: Secondary | ICD-10-CM

## 2015-12-26 DIAGNOSIS — Z3A13 13 weeks gestation of pregnancy: Secondary | ICD-10-CM | POA: Diagnosis not present

## 2015-12-26 DIAGNOSIS — O09899 Supervision of other high risk pregnancies, unspecified trimester: Secondary | ICD-10-CM

## 2015-12-26 DIAGNOSIS — Z3682 Encounter for antenatal screening for nuchal translucency: Secondary | ICD-10-CM | POA: Diagnosis not present

## 2015-12-26 DIAGNOSIS — O0991 Supervision of high risk pregnancy, unspecified, first trimester: Secondary | ICD-10-CM

## 2015-12-26 IMAGING — US US MFM FETAL NUCHAL TRANSLUCENCY
1 series · 15 of 28 positions shown · non-contrast
Comparison: none

[Series 1: us mfm fetal nuchal translucency · 15 of 32 slices shown]
[im 1/32]
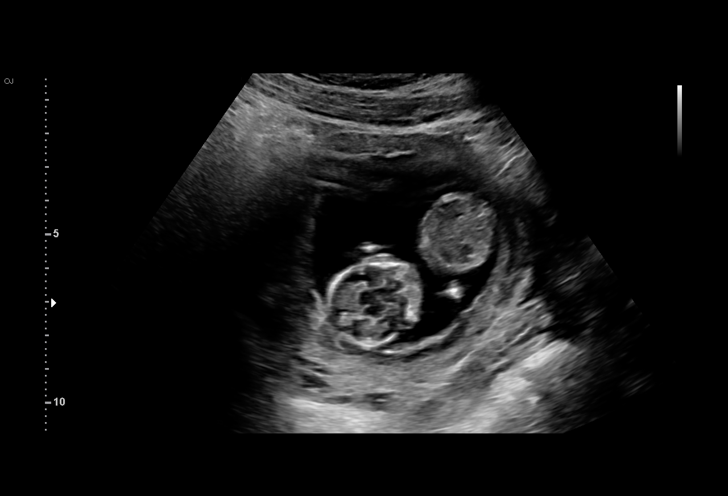
[im 3/32]
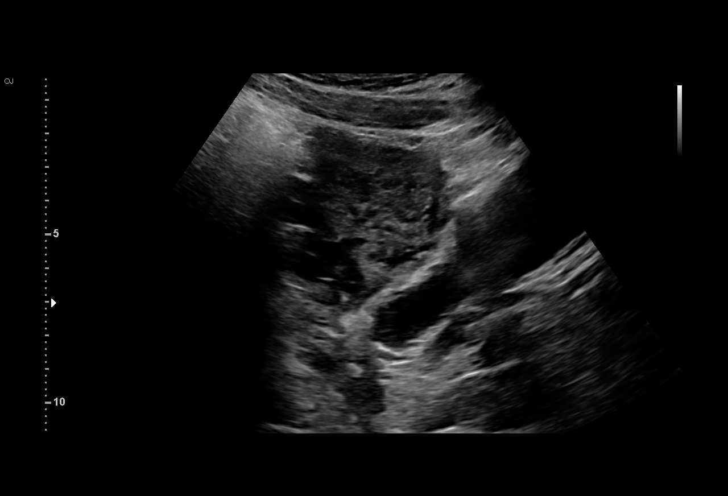
[im 5/32]
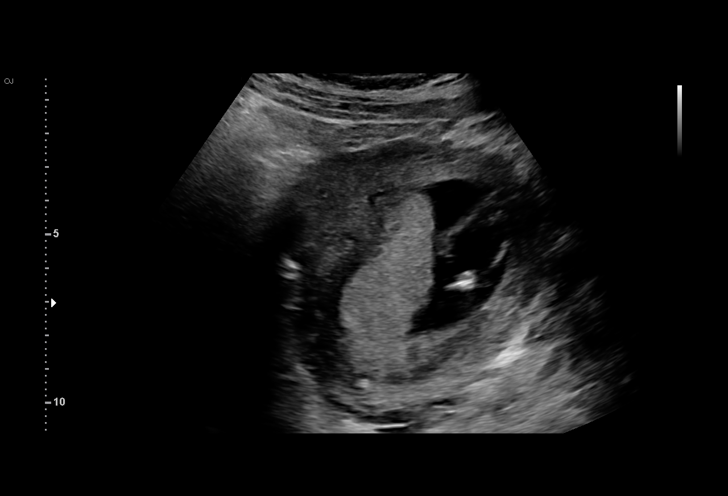
[im 7/32]
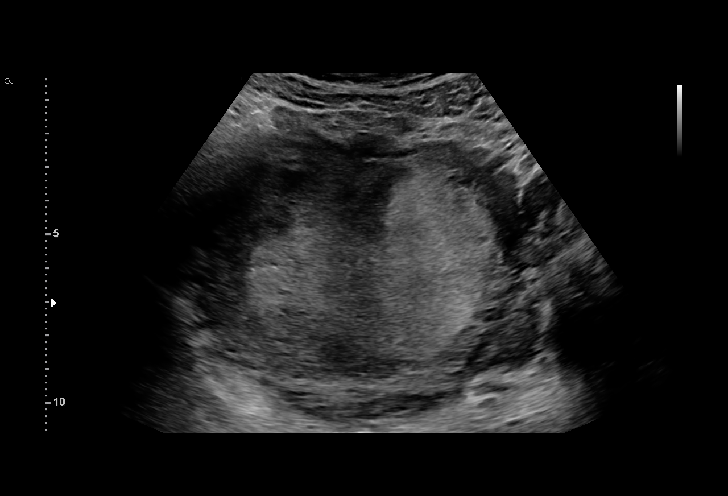
[im 10/32]
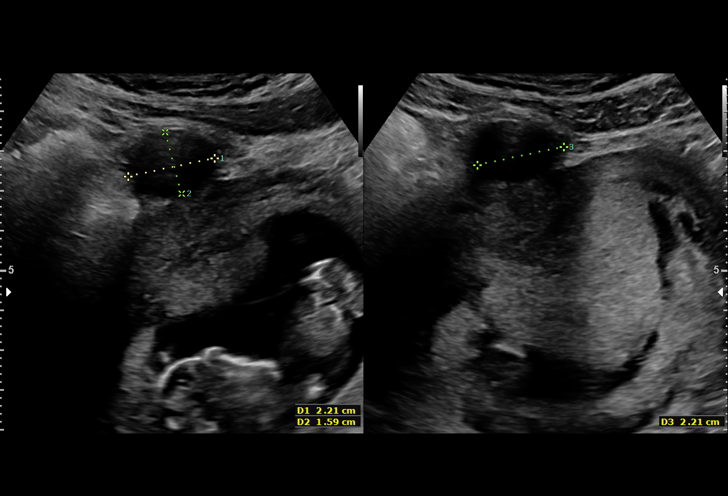
[im 12/32]
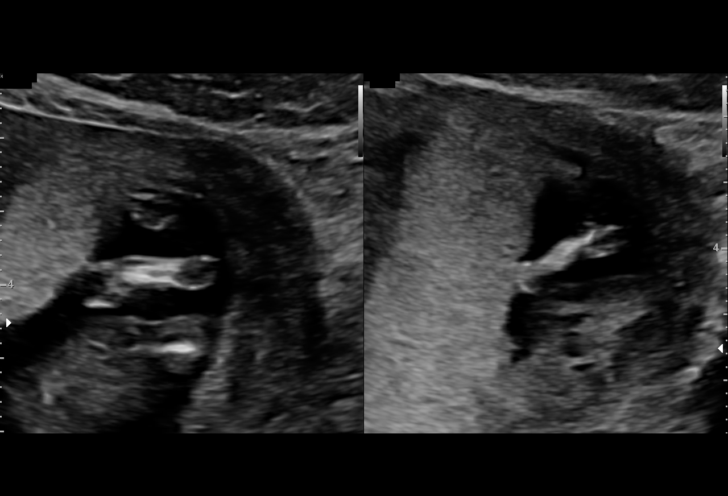
[im 14/32]
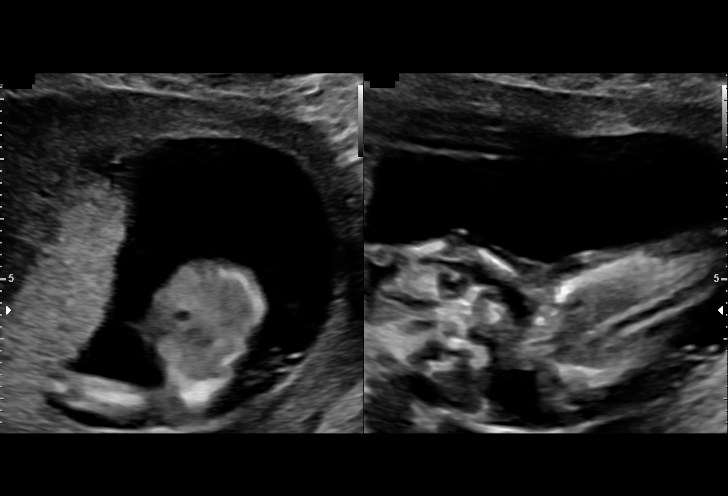
[im 17/32]
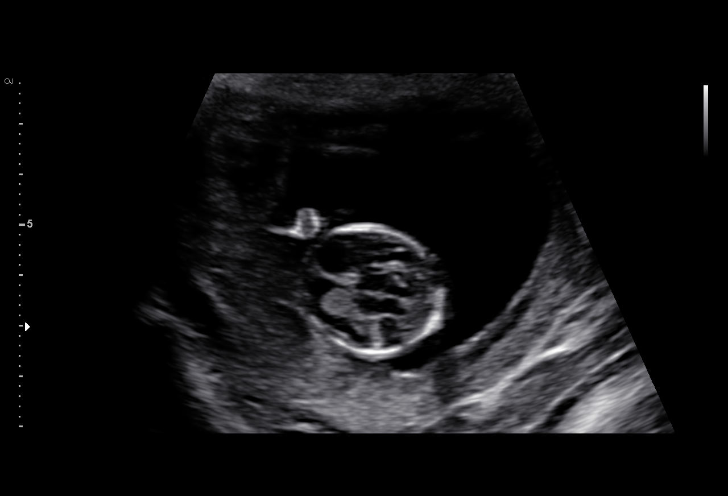
[im 18/32]
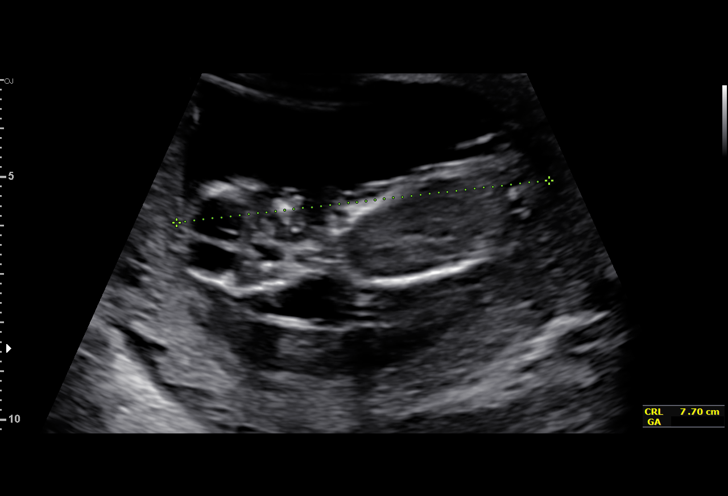
[im 20/32]
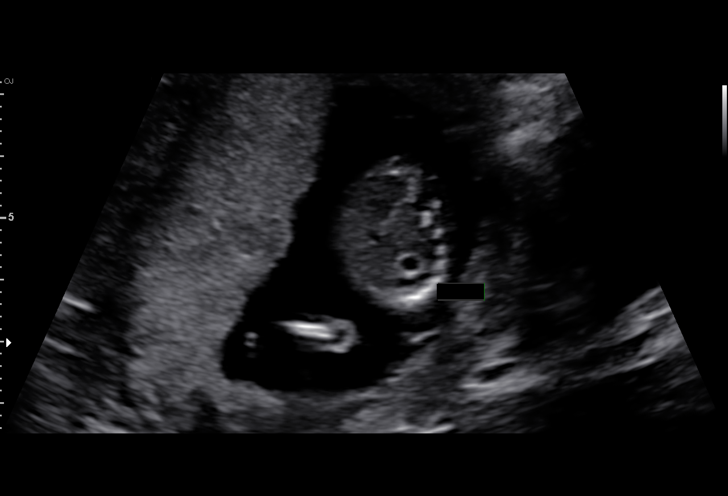
[im 22/32]
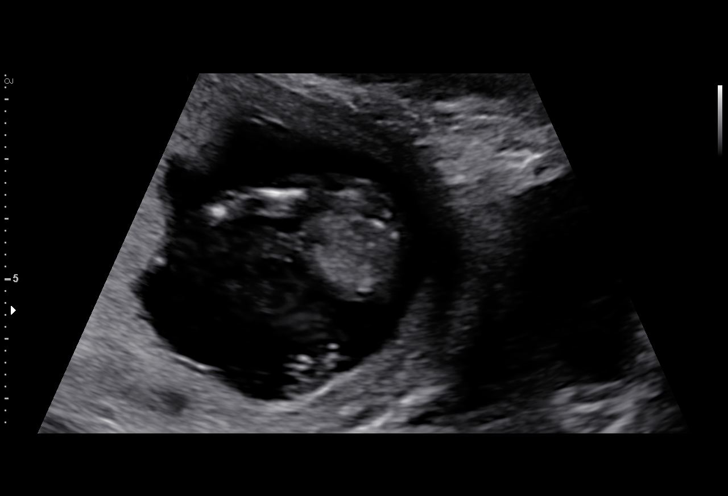
[im 25/32]
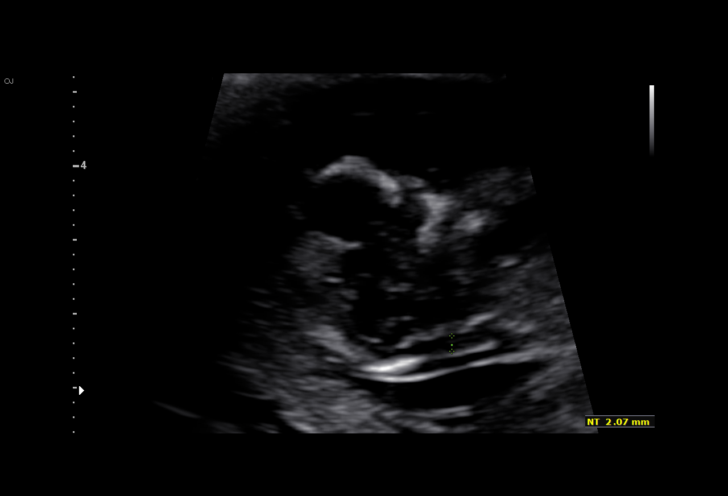
[im 27/32]
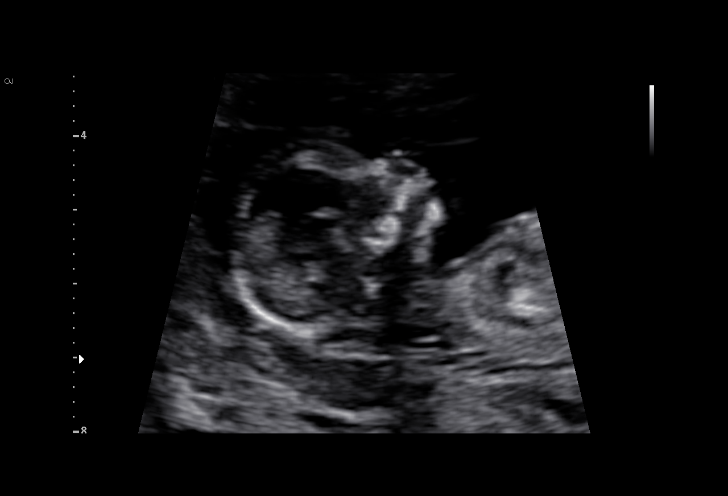
[im 29/32]
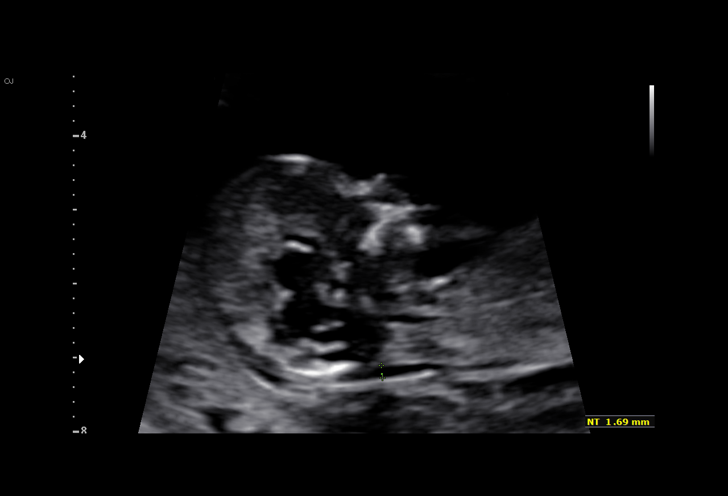
[im 32/32]
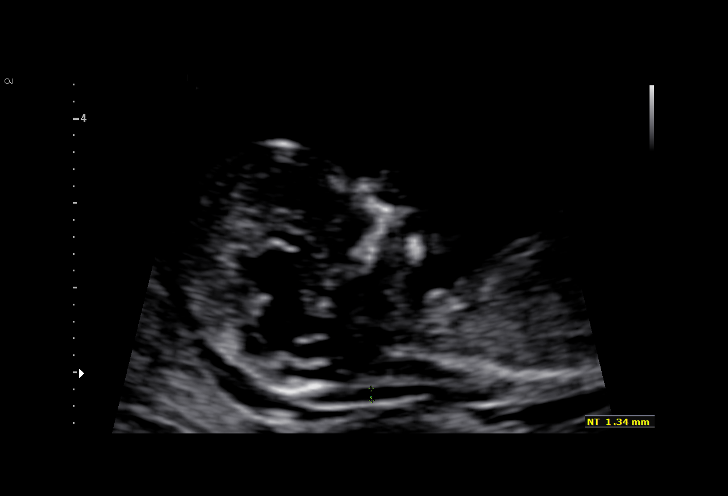

[15 of 28 positions shown; findings below may reference images not displayed]

TRANSLUCENCY

1  DNOTY WAWAN              [id number]      [id number]     [id number]
Indications

13 weeks gestation of pregnancy
Encounter for nuchal translucency
OB History

Blood Type:            Height:  5'3"   Weight (lb):  158      BMI:
Gravidity:    3         Term:   0        Prem:   0        SAB:   1
TOP:          1       Ectopic:  0        Living: 0
Fetal Evaluation

Num Of Fetuses:     1
Preg. Location:     Intrauterine
Gest. Sac:          Intrauterine
Fetal Heart         157
Rate(bpm):
Cardiac Activity:   Observed
Gestational Age

Best:          13w 0d    Det. By:   Early Ultrasound         EDD:   07/02/16
(11/14/15)
1st Trimester Genetic Sonogram Screening

CRL:              76  mm    G. Age:   13w 3d                 EDD:   06/29/16
Nuc Trans:       1.9  mm
Nasal Bone:                 Present
Anatomy
Cranium:               Appears normal         Upper Extremities:      Noted
Abdomen:               Appears normal         Lower Extremities:      Nited
Bladder:               Appears normal
Myomas

Site                     L(cm)      W(cm)      D(cm)      Location
Anterior                 2.2        1.6        2.2        Intramural

Blood Flow                 RI        PI       Comments

Comments

The patient presented for nuchal translucency screen as part
of a first trimester screen.  Blood drawn for serum analytes
today.
Impression

Single living intrauterine pregnancy at 13 weeks 0 days.
Normal nuchal translucency.
No gross fetal anomalies identified.
Recommendations

Recommend follow-up ultrasound examination in 5 weeks for
detailed fetal anatomic survey.

## 2015-12-29 ENCOUNTER — Encounter: Payer: Self-pay | Admitting: Neurology

## 2015-12-29 ENCOUNTER — Ambulatory Visit (INDEPENDENT_AMBULATORY_CARE_PROVIDER_SITE_OTHER): Payer: Medicaid Other | Admitting: Neurology

## 2015-12-29 DIAGNOSIS — G40209 Localization-related (focal) (partial) symptomatic epilepsy and epileptic syndromes with complex partial seizures, not intractable, without status epilepticus: Secondary | ICD-10-CM | POA: Insufficient documentation

## 2015-12-29 MED ORDER — LAMOTRIGINE 25 MG PO TABS: ORAL_TABLET | ORAL | 0 refills | Status: DC

## 2015-12-29 MED ORDER — FOLIC ACID 1 MG PO TABS: 1.0000 mg | ORAL_TABLET | Freq: Every day | ORAL | 4 refills | Status: DC

## 2015-12-29 MED ORDER — LAMOTRIGINE 100 MG PO TABS: 100.0000 mg | ORAL_TABLET | Freq: Two times a day (BID) | ORAL | 11 refills | Status: DC

## 2015-12-29 NOTE — Progress Notes (Signed)
PATIENT: Stephanie Yu DOB: 08/25/1990  Chief Complaint  Patient presents with  . Seizures    She is here with her cousin, Lenna Sciara.  She is pregnant with an estimated due date of 07/02/16. She had her first seizure at age 25.  She has had a total of 3-4 seizures with her last episode being in May 2017.  She has been on gabapentin in the past but it caused her excessive drowsiness so she stopped it.    . OB-GYN    Donnamae Jude, MD (no PCP)     HISTORICAL  Stephanie Yu is a 69 years old right-handed female, seen in refer by her OB/GYN doctor Donnamae Jude for evaluation of seizure, initial evaluation is on December 29 2015. She is currently [redacted] weeks pregnant, she is accompanied by her cousin Melissa at today's clinical visit.  Her first seizure was in 2015, she felt irritated all day long, when she went to the grocery store that day, she noticed that the fluorescent light hurt her eyes, later she had a seizure in the store, she reported prolonged post event confusion, has no recollection of the event, she contributed to her seizure due to stress, sleep deprivation, she works 3 jobs that time, only slept a few hours seen 72 hours.  Second seizure was in summer of 2016, preceded by blurry vision, again light sensitivity, slurred speech, had generalized tonic seizure later on. Followed by post event confusion.  There was few incidents later, she felt funny, light sensitivity, she was able to take time taking nap, when he woke up from few hours sleep, she felt better.  Most recent seizure was in May 2017. 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  She is now [redacted] weeks pregnant, due date is on Jul 02 2016. She complains of depression anxiety, taking then accessed needed. She has been seen by psychiatrist Dr. Toy Care, [place]up[place] for few years,  taking librium 10 mg and prozac for 6 months, she has stopped taking those medications after she found out she is pregnant.  REVIEW OF SYSTEMS: Full 14 system review of systems performed and notable only for ringing ears, blurry vision, shortness of breath, wheezing, allergy, skin sensitivity, memory loss, confusion, headaches, slurred speech, dizziness, seizure, passing out, depression anxiety  ALLERGIES: Allergies  Allergen Reactions  . Amoxicillin     rash  . Augmentin [Amoxicillin-Pot Clavulanate]   . Penicillins     Has patient had a PCN reaction causing immediate rash, facial/tongue/throat swelling, SOB or lightheadedness with hypotension: No Has patient had a PCN reaction causing severe rash involving mucus membranes or skin necrosis: yes Has patient had a PCN reaction that required hospitalization No Has patient had a PCN reaction occurring within the last 10 years: No If all of the above answers are "NO", then may proceed with Cephalosporin use.   . Vicodin [Hydrocodone-Acetaminophen] Itching    Nausea and vomiting    HOME MEDICATIONS: Current Outpatient Prescriptions  Medication Sig Dispense Refill  . ALPRAZolam (XANAX) 1 MG tablet Take 1 mg by mouth at bedtime as needed for anxiety.    . Prenatal Vit-Fe Fumarate-FA (PREPLUS) 27-1 MG TABS Take 1 tablet by mouth daily. 30 tablet 13  . promethazine (PHENERGAN) 25 MG tablet Take 0.5-1 tablets (12.5-25 mg total) by mouth every 6 (six) hours as needed. 12  tablet 3   No current facility-administered medications for this visit.     PAST MEDICAL HISTORY: Past Medical History:  Diagnosis Date  . Allergic rhinitis   . Anxiety   . Asthma   . Depression   . Miscarriage   . Seizures ([place])     PAST SURGICAL HISTORY: Past Surgical History:  Procedure Laterality Date  . DILATION AND CURETTAGE OF UTERUS    . DILATION AND EVACUATION N/A 09/08/2015   Procedure: DILATATION AND EVACUATION;  Surgeon: Aletha Halim, MD;  Location:  [facility];  Service: Gynecology;  Laterality: N/A;    FAMILY HISTORY: Family History  Problem Relation Age of Onset  . Hypertension Mother   . Heart disease Mother   . Diabetes Mother   . Hypertension Father   . Gout Father   . Anxiety disorder Father   . Lupus Maternal Grandmother   . Gout Paternal Grandmother   . Seizures Sister     SOCIAL HISTORY:  Social History   Social History  . Marital status: Single    Spouse name: N/A  . Number of children: 0  . Years of education: In college now   Occupational History  . Unemployed    Social History Main Topics  . Smoking status: Former Smoker    Packs/day: 0.25    Years: 10.00    Types: Cigarettes  . Smokeless tobacco: Never Used     Comment: Quit 10/11/15  . Alcohol use No     Comment: Quit 10/11/15  . Drug use: No  . Sexual activity: Yes    Birth control/ protection: None   Other Topics Concern  . Not on file   Social History Narrative   Lives at home alone.   Right-handed.   No caffeine use.         PHYSICAL EXAM   Vitals:   12/29/15 1059  Weight: 154 lb 4 oz (70 kg)  Height: 5\' 3"  (1.6 m)    Not recorded      Body mass index is 27.32 kg/m.  PHYSICAL EXAMNIATION:  Gen: NAD, conversant, well nourised, obese, well groomed                     Cardiovascular: Regular rate rhythm, no peripheral edema, warm, nontender. Eyes: Conjunctivae clear without exudates or hemorrhage Neck: Supple, no carotid bruits. Pulmonary: Clear to auscultation bilaterally   NEUROLOGICAL EXAM:  MENTAL STATUS: Speech:    Speech is normal; fluent and spontaneous with normal comprehension.  Cognition:     Orientation to time, place and person     Normal recent and remote memory     Normal Attention span and concentration     Normal Language, naming, repeating,spontaneous speech     Fund of knowledge   CRANIAL NERVES: CN II: Visual fields are full to confrontation. Fundoscopic exam is normal with sharp discs and no  vascular changes. Pupils are round equal and briskly reactive to light. CN III, IV, VI: extraocular movement are normal. No ptosis. CN V: Facial sensation is intact to pinprick in all 3 divisions bilaterally. Corneal responses are intact.  CN VII: Face is symmetric with normal eye closure and smile. CN VIII: Hearing is normal to rubbing fingers CN IX, X: Palate elevates symmetrically. Phonation is normal. CN XI: Head turning and shoulder shrug are intact CN XII: Tongue is midline with normal movements and no atrophy.  MOTOR: There is no pronator drift of out-stretched arms. Muscle bulk and tone  are normal. Muscle strength is normal.  REFLEXES: Reflexes are 2+ and symmetric at the biceps, triceps, knees, and ankles. Plantar responses are flexor.  SENSORY: Intact to light touch, pinprick, positional sensation and vibratory sensation are intact in fingers and toes.  COORDINATION: Rapid alternating movements and fine finger movements are intact. There is no dysmetria on finger-to-nose and heel-knee-shin.    GAIT/STANCE: Posture is normal. Gait is steady with normal steps, base, arm swing, and turning. Heel and toe walking are normal. Tandem gait is normal.  Romberg is absent.   DIAGNOSTIC DATA (LABS, IMAGING, TESTING) - I reviewed patient records, labs, notes, testing and imaging myself where available.   ASSESSMENT AND PLAN  Adiah Nassar is a 25 y.o. 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 Ph.D.  [facility] [address], [place], [place] [address] Ph: [phone] Fax: [phone]  CC: Donnamae Jude, MD

## 2016-01-01 ENCOUNTER — Encounter: Payer: Self-pay | Admitting: *Deleted

## 2016-01-05 ENCOUNTER — Other Ambulatory Visit (HOSPITAL_COMMUNITY): Payer: Self-pay

## 2016-01-05 ENCOUNTER — Ambulatory Visit (INDEPENDENT_AMBULATORY_CARE_PROVIDER_SITE_OTHER): Payer: Medicaid Other | Admitting: Obstetrics and Gynecology

## 2016-01-05 VITALS — BP 114/79 | HR 96 | Wt 159.0 lb

## 2016-01-05 DIAGNOSIS — O0990 Supervision of high risk pregnancy, unspecified, unspecified trimester: Secondary | ICD-10-CM

## 2016-01-05 DIAGNOSIS — O99351 Diseases of the nervous system complicating pregnancy, first trimester: Secondary | ICD-10-CM

## 2016-01-05 DIAGNOSIS — G40909 Epilepsy, unspecified, not intractable, without status epilepticus: Secondary | ICD-10-CM

## 2016-01-05 DIAGNOSIS — O09899 Supervision of other high risk pregnancies, unspecified trimester: Secondary | ICD-10-CM

## 2016-01-05 DIAGNOSIS — Z141 Cystic fibrosis carrier: Secondary | ICD-10-CM

## 2016-01-05 NOTE — Progress Notes (Signed)
   PRENATAL VISIT NOTE  Subjective:  Stephanie Yu is a 25 y.o. G3P0020 at [redacted]w[redacted]d being seen today for ongoing prenatal care.  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  Patient reports no complaints.   .  .   . Denies leaking of fluid.   The following portions of the patient's history were reviewed and updated as appropriate: allergies, current medications, past family history, past medical history, past social history, past surgical history and problem list. Problem list updated.  Objective:   Vitals:   01/05/16 1103  BP: 114/79  Pulse: 96  Weight: 159 lb (72.1 kg)    Fetal Status: Fetal Heart Rate (bpm): 156         General:  Alert, oriented and cooperative. Patient is in no acute distress.  Skin: Skin is warm and dry. No rash noted.   Cardiovascular: Normal heart rate noted  Respiratory: Normal respiratory effort, no problems with respiration noted  Abdomen: Soft, gravid, appropriate for gestational age.       Pelvic:  Cervical exam deferred        Extremities: Normal range of motion.     Mental Status: Normal mood and affect. Normal behavior. Normal judgment and thought content.   Assessment and Plan:  Pregnancy: [id number] at [redacted]w[redacted]d  1. 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 Cystic fibrosis carrier, antepartum Will follow up with genetic counselor - [place]  3. Seizure disorder ([place]) Patient was prescribed an antiseizure medication by Neurologist but patient has  not been taking it due to fear of fetal effect. Discussed that benefits outweigh the risks.  She has another appointment with neurologist this week for EEG and she will decide if she will take the medication at that time  General obstetric precautions including but not limited to vaginal bleeding, contractions, leaking of fluid and fetal movement were reviewed in detail with the patient. Please refer to After Visit Summary for other counseling recommendations.  Return in about 4 weeks (around 02/02/2016).   Mora Bellman, MD

## 2016-01-05 NOTE — Progress Notes (Signed)
Pt complains of HA today.

## 2016-01-06 ENCOUNTER — Ambulatory Visit (INDEPENDENT_AMBULATORY_CARE_PROVIDER_SITE_OTHER): Payer: Medicaid Other | Admitting: Neurology

## 2016-01-06 DIAGNOSIS — G40209 Localization-related (focal) (partial) symptomatic epilepsy and epileptic syndromes with complex partial seizures, not intractable, without status epilepticus: Secondary | ICD-10-CM | POA: Diagnosis not present

## 2016-01-09 NOTE — Procedures (Signed)
   HISTORY: 25 years old right-handed female, with history of seizure  TECHNIQUE:  16 channel EEG was performed based on standard 10-16 international system. One channel was dedicated to EKG, which has demonstrates normal sinus rhythm of beats per minutes.  Upon awakening, the posterior background activity were symmetric, with mixed alpha and delta range activity, reactive to eye opening and closure.  There was no evidence of epileptiform discharge.  Photic stimulation was performed, which induced a symmetric photic driving.  Hyperventilation was performed, there was no abnormality elicit.  No sleep was achieved.  CONCLUSION: This is a normal awake EEG.  There is no electrodiagnostic evidence of epileptiform discharge.  Marcial Pacas, M.D. Ph.D.  [facility] [place], [place] [address] Phone: [phone] Fax:      [phone]

## 2016-01-11 ENCOUNTER — Encounter (HOSPITAL_COMMUNITY): Payer: Self-pay

## 2016-01-11 ENCOUNTER — Inpatient Hospital Stay (HOSPITAL_COMMUNITY)
Admission: AD | Admit: 2016-01-11 | Discharge: 2016-01-12 | Disposition: A | Discharge status: Home / Self Care | Payer: Medicaid Other | Source: Ambulatory Visit | Attending: Family Medicine | Admitting: Family Medicine

## 2016-01-11 ENCOUNTER — Inpatient Hospital Stay (HOSPITAL_COMMUNITY): Payer: Medicaid Other

## 2016-01-11 DIAGNOSIS — O2342 Unspecified infection of urinary tract in pregnancy, second trimester: Secondary | ICD-10-CM | POA: Diagnosis not present

## 2016-01-11 DIAGNOSIS — Z87891 Personal history of nicotine dependence: Secondary | ICD-10-CM | POA: Diagnosis not present

## 2016-01-11 DIAGNOSIS — Z8249 Family history of ischemic heart disease and other diseases of the circulatory system: Secondary | ICD-10-CM | POA: Insufficient documentation

## 2016-01-11 DIAGNOSIS — Z3A15 15 weeks gestation of pregnancy: Secondary | ICD-10-CM

## 2016-01-11 DIAGNOSIS — O26892 Other specified pregnancy related conditions, second trimester: Secondary | ICD-10-CM | POA: Insufficient documentation

## 2016-01-11 DIAGNOSIS — O09899 Supervision of other high risk pregnancies, unspecified trimester: Secondary | ICD-10-CM

## 2016-01-11 DIAGNOSIS — Z833 Family history of diabetes mellitus: Secondary | ICD-10-CM | POA: Diagnosis not present

## 2016-01-11 DIAGNOSIS — Z885 Allergy status to narcotic agent status: Secondary | ICD-10-CM | POA: Insufficient documentation

## 2016-01-11 DIAGNOSIS — O0990 Supervision of high risk pregnancy, unspecified, unspecified trimester: Secondary | ICD-10-CM

## 2016-01-11 DIAGNOSIS — O209 Hemorrhage in early pregnancy, unspecified: Secondary | ICD-10-CM | POA: Diagnosis not present

## 2016-01-11 DIAGNOSIS — Z880 Allergy status to penicillin: Secondary | ICD-10-CM | POA: Diagnosis not present

## 2016-01-11 DIAGNOSIS — R000 Tachycardia, unspecified: Secondary | ICD-10-CM | POA: Diagnosis not present

## 2016-01-11 DIAGNOSIS — O4692 Antepartum hemorrhage, unspecified, second trimester: Secondary | ICD-10-CM

## 2016-01-11 DIAGNOSIS — N939 Abnormal uterine and vaginal bleeding, unspecified: Secondary | ICD-10-CM | POA: Diagnosis present

## 2016-01-11 DIAGNOSIS — Z141 Cystic fibrosis carrier: Secondary | ICD-10-CM

## 2016-01-11 LAB — CBC
HCT: 30.7 % — ABNORMAL LOW (ref 36.0–46.0)
Hemoglobin: 10.8 g/dL — ABNORMAL LOW (ref 12.0–15.0)
MCH: 31.7 pg (ref 26.0–34.0)
MCHC: 35.2 g/dL (ref 30.0–36.0)
MCV: 90 fL (ref 78.0–100.0)
Platelets: 208 10*3/uL (ref 150–400)
RBC: 3.41 MIL/uL — ABNORMAL LOW (ref 3.87–5.11)
RDW: 13.2 % (ref 11.5–15.5)
WBC: 10.8 10*3/uL — ABNORMAL HIGH (ref 4.0–10.5)

## 2016-01-11 LAB — URINALYSIS, ROUTINE W REFLEX MICROSCOPIC
Bilirubin Urine: NEGATIVE
GLUCOSE, UA: 250 mg/dL — AB
Nitrite: POSITIVE — AB
Specific Gravity, Urine: 1.015 (ref 1.005–1.030)
pH: 7 (ref 5.0–8.0)

## 2016-01-11 LAB — URINE MICROSCOPIC-ADD ON

## 2016-01-11 IMAGING — US US MFM OB LIMITED
1 series · 15 of 26 positions shown · non-contrast
Comparison: none

[Series 1: us mfm ob limited · 15 of 26 slices shown]
[im 1/26]
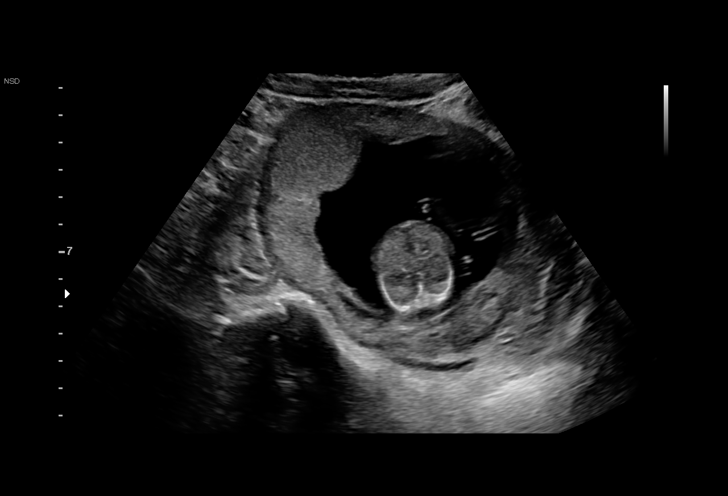
[im 3/26]
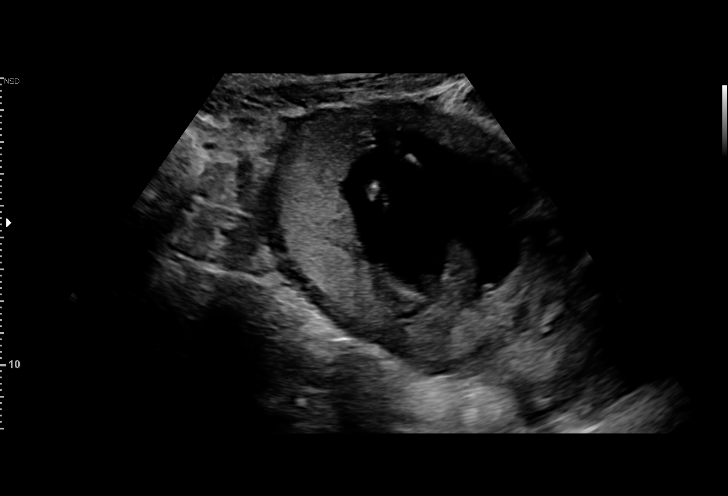
[im 5/26]
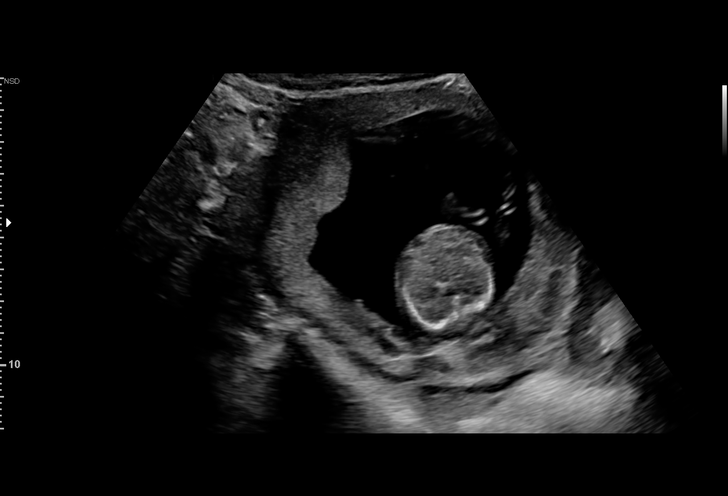
[im 7/26]
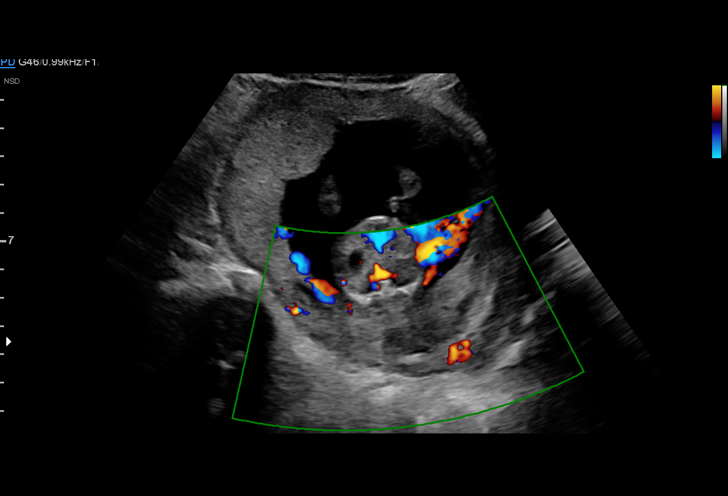
[im 8/26]
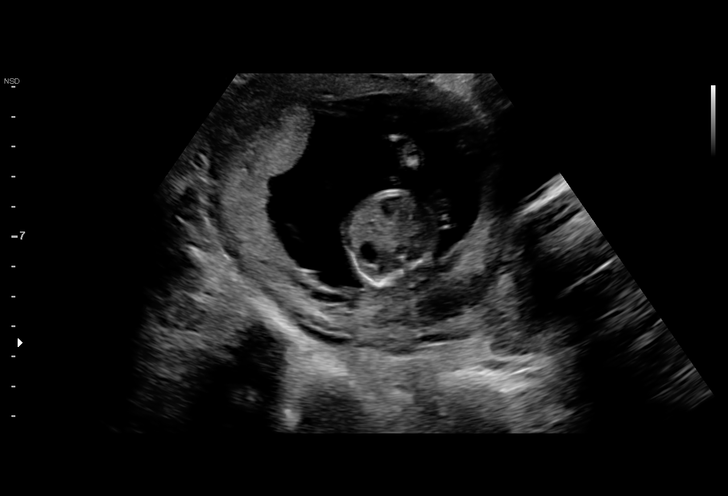
[im 10/26]
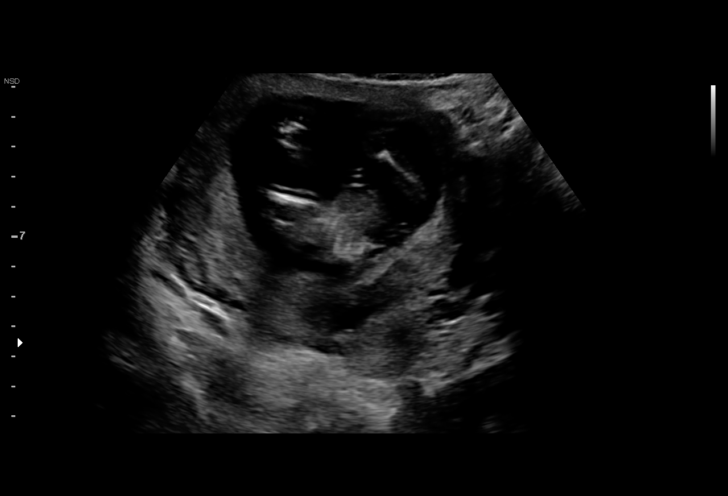
[im 12/26]
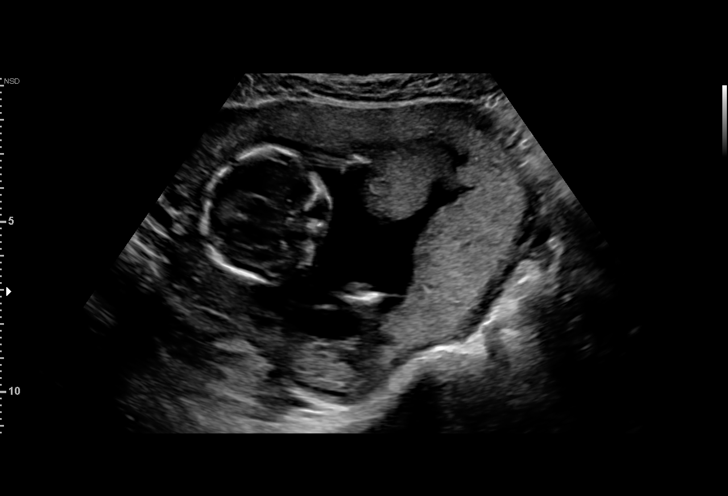
[im 14/26]
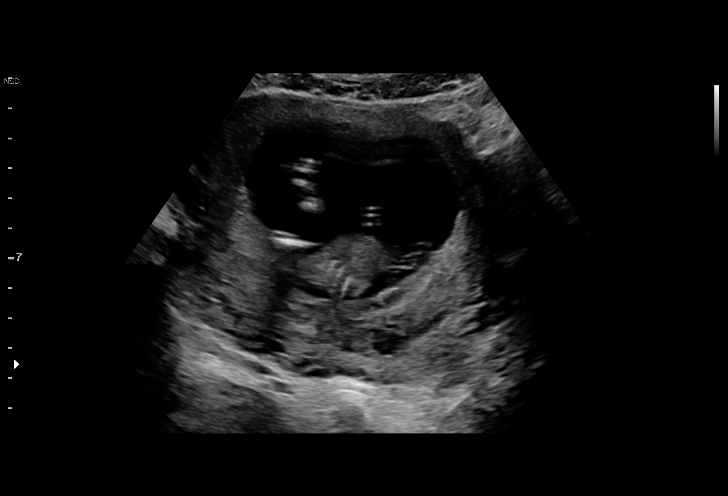
[im 15/26]
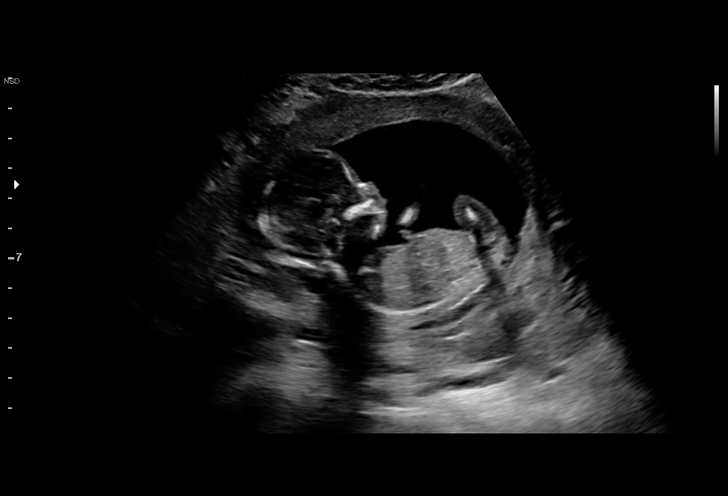
[im 17/26]
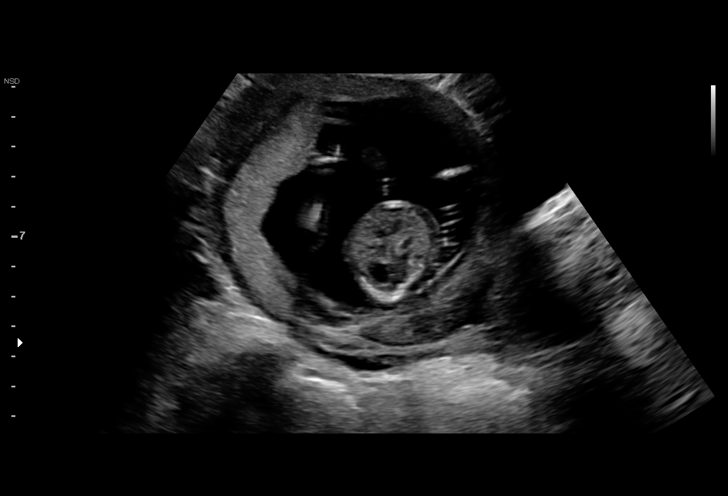
[im 19/26]
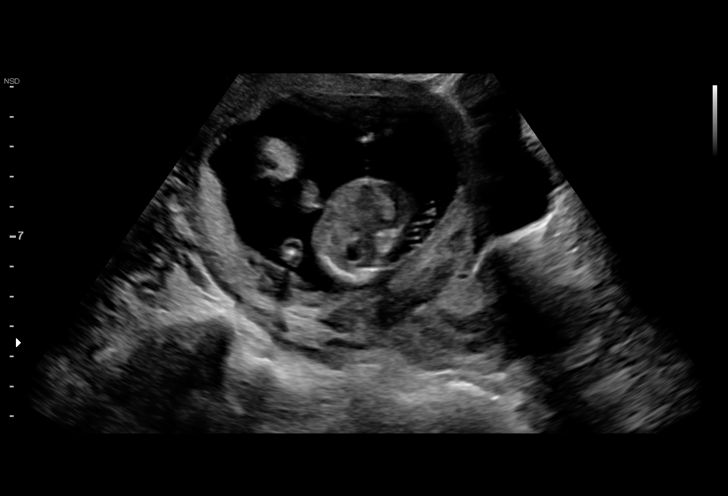
[im 20/26]
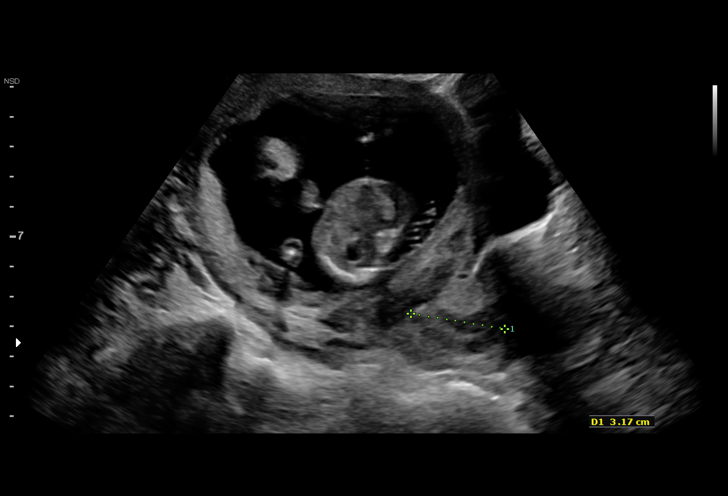
[im 22/26]
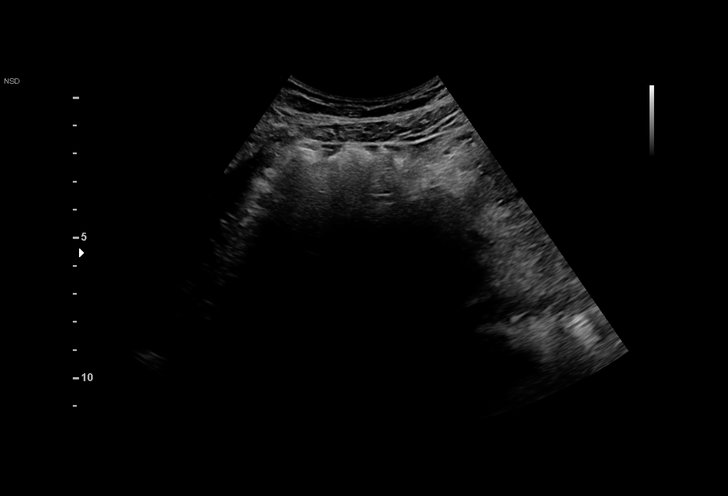
[im 24/26]
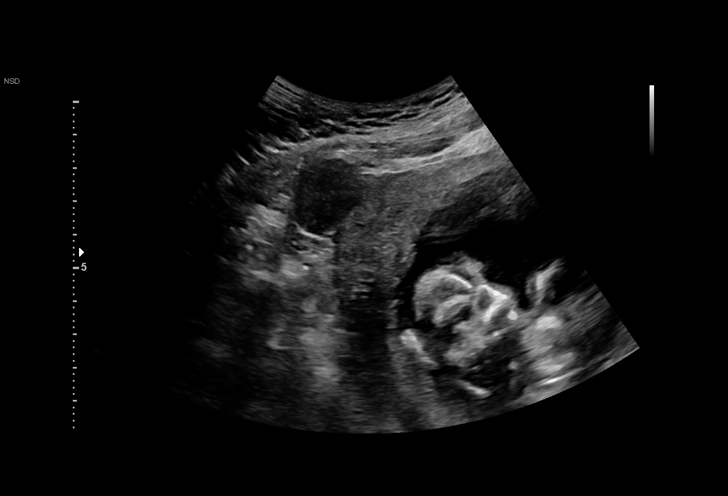
[im 26/26]
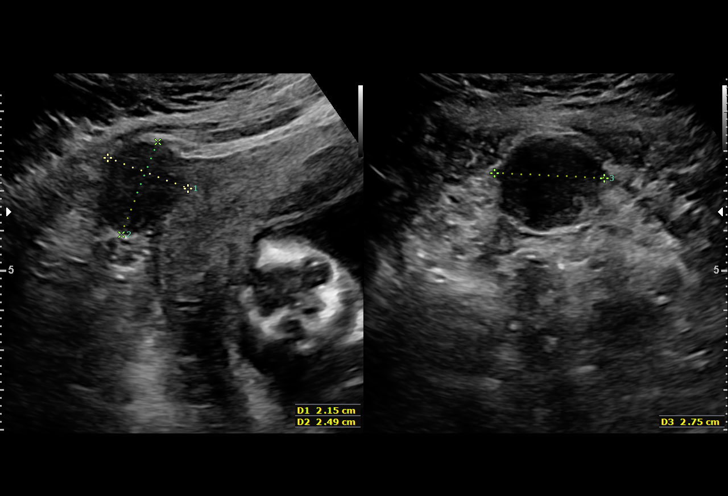

[15 of 26 positions shown; findings below may reference images not displayed]

MAU/Triage

1  BANAZLI NOA            [id number]      [id number]     [id number]
Indications

15 weeks gestation of pregnancy
Vaginal bleeding in pregnancy, second
trimester
OB History

Blood Type:            Height:  5'3"   Weight (lb):  158       BMI:
Gravidity:    3         Term:   0        Prem:   0        SAB:   1
TOP:          1       Ectopic:  0        Living: 0
Fetal Evaluation

Num Of Fetuses:     1
Fetal Heart         164
Rate(bpm):
Cardiac Activity:   Observed
Presentation:       Transverse, head to maternal right
Placenta:           Posterior, above cervical os
P. Cord Insertion:  Visualized

Amniotic Fluid
AFI FV:      Subjectively within normal limits

Largest Pocket(cm)
6.3

Comment:    Collection at internal os measuring 2.2 x 2.5 x 4.6cm.
Gestational Age

Best:          15w 2d     Det. By:  Early Ultrasound         EDD:   07/02/16
(11/14/15)
Cervix Uterus Adnexa

Cervix
Length:            3.2  cm.
Normal appearance by transabdominal scan.

Left Ovary
Not visualized.

Right Ovary
Not visualized.

Adnexa:       No abnormality visualized.
Myomas

Site                     L(cm)      W(cm)      D(cm)      Location
Fundus

Blood Flow                 RI        PI       Comments

Impression

IUP at 15+2 weeks with vaginal bleeding
Normal fetal movement and cardiac activity
Amniotic fluid level normal
Cervical length 32 mm transabdominally
Small subchorionic fluid collection noted  Shuhey internal os
Recommendations

Probable small subchorionic bleed. Recommend continued
clinical evaluation. Follow-up ultrasounds as clinically
indicated.

## 2016-01-11 MED ORDER — NITROFURANTOIN MONOHYD MACRO 100 MG PO CAPS
100.0000 mg | ORAL_CAPSULE | Freq: Two times a day (BID) | ORAL | 0 refills | Status: DC
Start: 2016-01-11 — End: 2016-02-05

## 2016-01-11 NOTE — MAU Provider Note (Signed)
History   [id number]   Chief Complaint  Patient presents with  . Vaginal Bleeding    HPI Stephanie Yu is a 25 y.o. female  G3P0020 at [redacted]w[redacted]d IUP here with report of bleeding that started at 92 today.  Bleeding described as heavy.  Reports blood went thru pants.  No report of clots.  Also reports cramping rated a 1/10.  Denies intercourse in past 24 hours.     Patient's last menstrual period was 06/27/2015.  OB History  Gravida Para Term Preterm AB Living  3 0 0 0 2    SAB TAB Ectopic Multiple Live Births  1 1 0        # Outcome Date GA Lbr Len/2nd Weight Sex Delivery Anes PTL Lv  3 Current           2 SAB           1 TAB               Past Medical History:  Diagnosis Date  . Allergic rhinitis   . Anxiety   . Asthma   . Depression   . Miscarriage   . Seizures ([place])     Family History  Problem Relation Age of Onset  . Hypertension Mother   . Heart disease Mother   . Diabetes Mother   . Hypertension Father   . Gout Father   . Anxiety disorder Father   . Lupus Maternal Grandmother   . Gout Paternal Grandmother   . Seizures Sister     Social History   Social History  . Marital status: Single    Spouse name: N/A  . Number of children: 0  . Years of education: In college now   Occupational History  . Unemployed    Social History Main Topics  . Smoking status: Former Smoker    Packs/day: 0.25    Years: 10.00    Types: Cigarettes  . Smokeless tobacco: Never Used     Comment: Quit 10/11/15  . Alcohol use No     Comment: Quit 10/11/15  . Drug use: No  . Sexual activity: Yes    Birth control/ protection: None   Other Topics Concern  . Not on file   Social History Narrative   Lives at home alone.   Right-handed.   No caffeine use.        Allergies  Allergen Reactions  . Amoxicillin     rash  . Augmentin [Amoxicillin-Pot Clavulanate]   . Penicillins     Has patient had a PCN reaction causing immediate rash, facial/tongue/throat swelling, SOB  or lightheadedness with hypotension: No Has patient had a PCN reaction causing severe rash involving mucus membranes or skin necrosis: yes Has patient had a PCN reaction that required hospitalization No Has patient had a PCN reaction occurring within the last 10 years: No If all of the above answers are "NO", then may proceed with Cephalosporin use.   . Vicodin [Hydrocodone-Acetaminophen] Itching    Nausea and vomiting    No current facility-administered medications on file prior to encounter.    Current Outpatient Prescriptions on File Prior to Encounter  Medication Sig Dispense Refill  . ALPRAZolam (XANAX) 1 MG tablet Take 1 mg by mouth at bedtime as needed for anxiety.    . folic acid (FOLVITE) 1 MG tablet Take 1 tablet (1 mg total) by mouth daily. 90 tablet 4  . lamoTRIgine (LAMICTAL) 100 MG tablet Take 1 tablet (100 mg total) by  mouth 2 (two) times daily. 60 tablet 11  . lamoTRIgine (LAMICTAL) 25 MG tablet 1 tablet twice a day for the first week 2 tablets twice a day for the second week 3 tablets twice a day for the third week 4 tablets twice a day for the fourth week  After finish titration with small dose of lamotrigine 25 mg, change to lamotrigine 100 mg twice a day 100 tablet 0  . Prenatal Vit-Fe Fumarate-FA (PREPLUS) 27-1 MG TABS Take 1 tablet by mouth daily. 30 tablet 13  . promethazine (PHENERGAN) 25 MG tablet Take 0.5-1 tablets (12.5-25 mg total) by mouth every 6 (six) hours as needed. 12 tablet 3     Review of Systems  Constitutional: Negative for fever.  Genitourinary: Positive for pelvic pain (cramping) and vaginal bleeding. Negative for vaginal discharge.  Neurological: Negative for headaches.  All other systems reviewed and are negative.    Physical Exam   Vitals:   01/11/16 2204  BP: 119/70  Pulse: 112  Resp: 18  Temp: 98.7 F (37.1 C)  TempSrc: Oral    Physical Exam  Constitutional: She is oriented to person, place, and time. She appears  well-developed and well-nourished.  HENT:  Head: Normocephalic.  Eyes: Pupils are equal, round, and reactive to light.  Neck: Normal range of motion. Neck supple.  Cardiovascular: Regular rhythm and normal heart sounds.   Tachycardia  Respiratory: Effort normal and breath sounds normal.  GI: Soft. She exhibits no mass. There is no tenderness. There is no guarding.  Genitourinary: Bleeding: Moderate blood seen on inner thighs; cervix visually closed, negative clots. There is bleeding in the vagina.  Neurological: She is alert and oriented to person, place, and time. She has normal reflexes.  Skin: Skin is warm and dry.    MAU Course  Procedures  MDM Results for orders placed or performed during the hospital encounter of 01/11/16 (from the past 24 hour(s))  Urinalysis, Routine w reflex microscopic (not at [facility])     Status: Abnormal   Collection Time: 01/11/16  9:55 PM  Result Value Ref Range   Color, Urine RED (A) YELLOW   APPearance CLOUDY (A) CLEAR   Specific Gravity, Urine 1.015 1.005 - 1.030   pH 7.0 5.0 - 8.0   Glucose, UA 250 (A) NEGATIVE mg/dL   Hgb urine dipstick LARGE (A) NEGATIVE   Bilirubin Urine NEGATIVE NEGATIVE   Ketones, ur >80 (A) NEGATIVE mg/dL   Protein, ur >300 (A) NEGATIVE mg/dL   Nitrite POSITIVE (A) NEGATIVE   Leukocytes, UA LARGE (A) NEGATIVE  Urine microscopic-add on     Status: Abnormal   Collection Time: 01/11/16  9:55 PM  Result Value Ref Range   Squamous Epithelial / LPF 6-30 (A) NONE SEEN   WBC, UA TOO NUMEROUS TO COUNT 0 - 5 WBC/hpf   RBC / HPF TOO NUMEROUS TO COUNT 0 - 5 RBC/hpf   Bacteria, UA MANY (A) NONE SEEN  CBC     Status: Abnormal   Collection Time: 01/11/16 10:33 PM  Result Value Ref Range   WBC 10.8 (H) 4.0 - 10.5 K/uL   RBC 3.41 (L) 3.87 - 5.11 MIL/uL   Hemoglobin 10.8 (L) 12.0 - 15.0 g/dL   HCT 30.7 (L) 36.0 - 46.0 %   MCV 90.0 78.0 - 100.0 fL   MCH 31.7 26.0 - 34.0 pg   MCHC 35.2 30.0 - 36.0 g/dL   RDW 13.2 11.5 - 15.5 %    Platelets 208 150 -  400 K/uL   Ultrasound (preliminary report): Collection at internal os (2.2x2.5x4.6) Cervical length 3.2 Placenta above os (nothing else noted about placenta) Fibroid 2.2x2.8.2.5  Consulted with Dr. Kennon Rounds > Reviewed HPI/Exam/labs/ultrasound > give bleeding precautions and return for follow-up in office in one week (message routed to staff)  Assessment and Plan  25 y.o. G3P0020 at [redacted]w[redacted]d IUP  Vaginal Bleeding in Pregnancy UTI in pregnancy Tachycardia (pt states HR always in low 100's and 110's)  Plan: Discharge home Explained high risk for miscarriage Discussed when to return Urine culture sent RX Macrobid BID x 7 days Follow-up in one week in office  [place], CNM 01/11/2016 11:46 PM

## 2016-01-11 NOTE — MAU Note (Signed)
Pt here with c/o vaginal bleeding since about 1900. Having some cramping. Had some bleeding earlier in pregnancy about 7 weeks.

## 2016-01-13 ENCOUNTER — Encounter: Payer: Self-pay | Admitting: Obstetrics

## 2016-01-13 ENCOUNTER — Ambulatory Visit (INDEPENDENT_AMBULATORY_CARE_PROVIDER_SITE_OTHER): Payer: Medicaid Other | Admitting: Obstetrics

## 2016-01-13 VITALS — BP 99/73 | HR 118 | Temp 97.3°F | Wt 165.5 lb

## 2016-01-13 DIAGNOSIS — O0990 Supervision of high risk pregnancy, unspecified, unspecified trimester: Secondary | ICD-10-CM

## 2016-01-13 DIAGNOSIS — O0992 Supervision of high risk pregnancy, unspecified, second trimester: Secondary | ICD-10-CM

## 2016-01-13 LAB — CULTURE, OB URINE: Culture: NO GROWTH

## 2016-01-13 NOTE — Progress Notes (Signed)
  Subjective:    Stephanie Yu is a 25 y.o. female being seen today for her obstetrical visit. She is at [redacted]w[redacted]d gestation. Patient reports: no complaints.  Problem List Items Addressed This Visit    Supervision of high risk pregnancy, antepartum - Primary   Relevant Orders   AFP, Quad Screen     Patient Active Problem List   Diagnosis Date Noted  . Complex partial seizure ([place]) 12/29/2015  . 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 Seizure disorder ([place]) 12/08/2015  . Supervision of high risk pregnancy, antepartum 12/08/2015  . Allergic rhinitis 12/08/2015  . Asthma, mild intermittent 12/08/2015  . Panic anxiety syndrome 12/08/2015    Objective:     BP 99/73   Pulse (!) 118   Temp 97.3 F (36.3 C)   Wt 165 lb 8 oz (75.1 kg)   LMP 06/27/2015   BMI 29.32 kg/m  Uterine Size: Below umbilicus     Assessment:    Pregnancy @ [redacted]w[redacted]d  weeks Doing well    Plan:    Problem list reviewed and updated. Labs reviewed.  Follow up in 4 weeks. FIRST/CF mutation testing/NIPT/QUAD SCREEN/fragile X/Ashkenazi Jewish population testing/Spinal muscular atrophy discussed: requested. Role of ultrasound in pregnancy discussed; fetal survey: requested. Amniocentesis discussed: not indicated. 50% of 15 minute visit spent on counseling and coordination of care.

## 2016-01-15 LAB — AFP, QUAD SCREEN
DIA Mom Value: 1.33
DIA Value (EIA): 235.38 pg/mL
DSR (By Age)    1 IN: 857
DSR (SECOND TRIMESTER) 1 IN: 5775
GESTATIONAL AGE AFP: 15.6 wk
MSAFP MOM: 1.31
MSAFP: 38 ng/mL
MSHCG Mom: 0.8
MSHCG: 32863 m[IU]/mL
Maternal Age At EDD: 28 YEARS
Osb Risk: 4664
Test Results:: NEGATIVE
UE3 MOM: 1.08
UE3 VALUE: 0.75 ng/mL
WEIGHT: 165 [lb_av]

## 2016-01-19 ENCOUNTER — Encounter: Payer: Self-pay | Admitting: Family

## 2016-01-19 DIAGNOSIS — O4692 Antepartum hemorrhage, unspecified, second trimester: Secondary | ICD-10-CM | POA: Insufficient documentation

## 2016-01-29 ENCOUNTER — Encounter: Payer: Self-pay | Admitting: Neurology

## 2016-02-05 ENCOUNTER — Ambulatory Visit (INDEPENDENT_AMBULATORY_CARE_PROVIDER_SITE_OTHER): Payer: Medicaid Other | Admitting: Obstetrics and Gynecology

## 2016-02-05 VITALS — BP 113/78 | HR 112 | Wt 168.0 lb

## 2016-02-05 DIAGNOSIS — Z141 Cystic fibrosis carrier: Secondary | ICD-10-CM

## 2016-02-05 DIAGNOSIS — O0990 Supervision of high risk pregnancy, unspecified, unspecified trimester: Secondary | ICD-10-CM

## 2016-02-05 DIAGNOSIS — O4692 Antepartum hemorrhage, unspecified, second trimester: Secondary | ICD-10-CM

## 2016-02-05 DIAGNOSIS — O09899 Supervision of other high risk pregnancies, unspecified trimester: Secondary | ICD-10-CM

## 2016-02-05 DIAGNOSIS — O99352 Diseases of the nervous system complicating pregnancy, second trimester: Secondary | ICD-10-CM

## 2016-02-05 DIAGNOSIS — G40209 Localization-related (focal) (partial) symptomatic epilepsy and epileptic syndromes with complex partial seizures, not intractable, without status epilepticus: Secondary | ICD-10-CM

## 2016-02-05 NOTE — Progress Notes (Signed)
AFP only due today  VB changing 1-2 pads a day and passing less than penny size clots

## 2016-02-05 NOTE — Progress Notes (Signed)
   PRENATAL VISIT NOTE  Subjective:  Stephanie Yu is a 25 y.o. G3P0020 at [redacted]w[redacted]d being seen today for ongoing prenatal care.  She is currently monitored for the following issues for this high-risk pregnancy and has Seizure disorder ([place]); Supervision of high risk pregnancy, antepartum; Allergic rhinitis; Asthma, mild intermittent; Panic anxiety syndrome; Cystic fibrosis carrier, antepartum; Complex partial seizure ([place]); and Vaginal bleeding in pregnancy, second trimester on her problem list.  Patient reports persistent vaginal bleeding consisting of dark blood, changing 1-2 pads per day.  Contractions: Not present. Vag. Bleeding: Small.  Movement: Absent. Denies leaking of fluid.   The following portions of the patient's history were reviewed and updated as appropriate: allergies, current medications, past family history, past medical history, past social history, past surgical history and problem list. Problem list updated.  Objective:   Vitals:   02/05/16 1042  BP: 113/78  Pulse: (!) 112  Weight: 168 lb (76.2 kg)    Fetal Status: Fetal Heart Rate (bpm): 150   Movement: Absent     General:  Alert, oriented and cooperative. Patient is in no acute distress.  Skin: Skin is warm and dry. No rash noted.   Cardiovascular: Normal heart rate noted  Respiratory: Normal respiratory effort, no problems with respiration noted  Abdomen: Soft, gravid, appropriate for gestational age. Pain/Pressure: Present     Pelvic:  Cervical exam deferred        Extremities: Normal range of motion.     Mental Status: Normal mood and affect. Normal behavior. Normal judgment and thought content.   Assessment and Plan:  Pregnancy: [id number] at [redacted]w[redacted]d  1. Supervision of high risk pregnancy, antepartum Follow up anatomy ultrasound tomorrow  2. Partial symptomatic epilepsy with complex partial seizures, not intractable, without status epilepticus ([place]) Patient not taking lamictal No recent seizure activity  3.  Cystic fibrosis carrier, antepartum She declined genetic counseling. Her boyfriend will follow up with PCP to see if he is a carrier  4. Vaginal bleeding in pregnancy, second trimester Precautions reviewed  General obstetric precautions including but not limited to vaginal bleeding, contractions, leaking of fluid and fetal movement were reviewed in detail with the patient. Please refer to After Visit Summary for other counseling recommendations.  Return in about 4 weeks (around 03/04/2016).   Mora Bellman, MD

## 2016-02-06 ENCOUNTER — Encounter: Payer: Self-pay | Admitting: Obstetrics and Gynecology

## 2016-02-06 ENCOUNTER — Other Ambulatory Visit (HOSPITAL_COMMUNITY): Payer: Self-pay | Admitting: *Deleted

## 2016-02-06 ENCOUNTER — Encounter (HOSPITAL_COMMUNITY): Payer: Self-pay

## 2016-02-06 ENCOUNTER — Other Ambulatory Visit: Payer: Self-pay | Admitting: Obstetrics and Gynecology

## 2016-02-06 ENCOUNTER — Ambulatory Visit (HOSPITAL_COMMUNITY)
Admission: RE | Admit: 2016-02-06 | Discharge: 2016-02-06 | Disposition: A | Discharge status: Home / Self Care | Payer: Medicaid Other | Source: Ambulatory Visit | Attending: Obstetrics and Gynecology | Admitting: Obstetrics and Gynecology

## 2016-02-06 DIAGNOSIS — Z3A19 19 weeks gestation of pregnancy: Secondary | ICD-10-CM | POA: Diagnosis not present

## 2016-02-06 DIAGNOSIS — O09892 Supervision of other high risk pregnancies, second trimester: Secondary | ICD-10-CM | POA: Diagnosis not present

## 2016-02-06 DIAGNOSIS — O4402 Placenta previa specified as without hemorrhage, second trimester: Secondary | ICD-10-CM | POA: Insufficient documentation

## 2016-02-06 DIAGNOSIS — O99352 Diseases of the nervous system complicating pregnancy, second trimester: Secondary | ICD-10-CM | POA: Diagnosis not present

## 2016-02-06 DIAGNOSIS — O43123 Velamentous insertion of umbilical cord, third trimester: Secondary | ICD-10-CM | POA: Insufficient documentation

## 2016-02-06 DIAGNOSIS — O0990 Supervision of high risk pregnancy, unspecified, unspecified trimester: Secondary | ICD-10-CM

## 2016-02-06 DIAGNOSIS — Z141 Cystic fibrosis carrier: Secondary | ICD-10-CM | POA: Diagnosis not present

## 2016-02-06 DIAGNOSIS — G40909 Epilepsy, unspecified, not intractable, without status epilepticus: Secondary | ICD-10-CM | POA: Insufficient documentation

## 2016-02-06 DIAGNOSIS — Z363 Encounter for antenatal screening for malformations: Secondary | ICD-10-CM | POA: Insufficient documentation

## 2016-02-06 DIAGNOSIS — O4692 Antepartum hemorrhage, unspecified, second trimester: Secondary | ICD-10-CM

## 2016-02-06 DIAGNOSIS — O4400 Placenta previa specified as without hemorrhage, unspecified trimester: Secondary | ICD-10-CM | POA: Insufficient documentation

## 2016-02-06 IMAGING — US US MFM OB DETAIL+14 WK
1 series · 13 of 28 positions shown · non-contrast
Comparison: none

[Series 1: us mfm ob detail+14 wk · 13 of 97 slices shown]
[im 4/97]
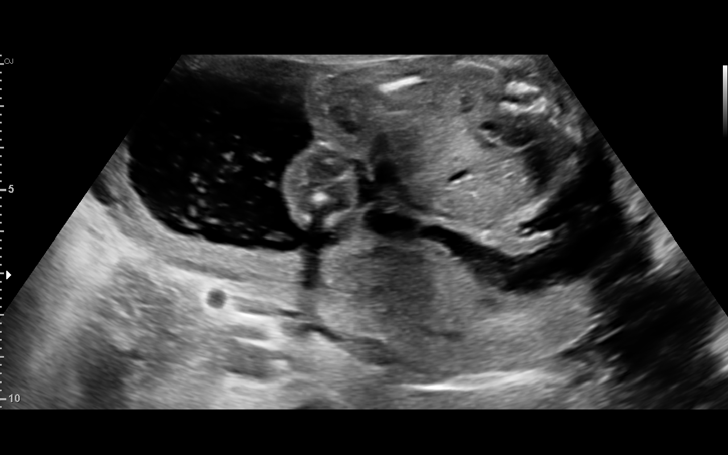
[im 11/97]
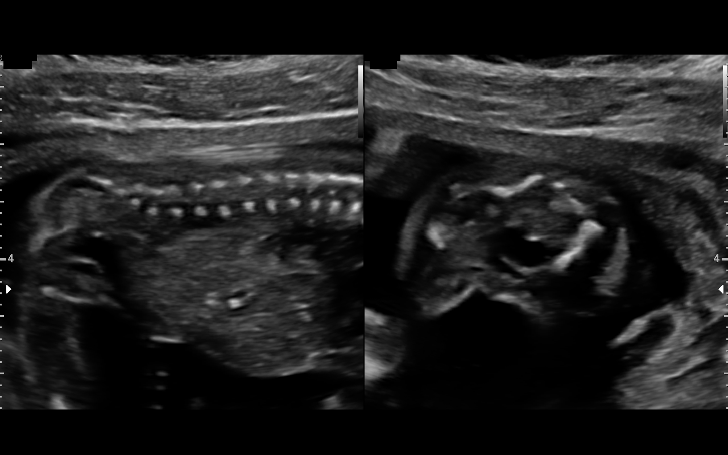
[im 18/97]
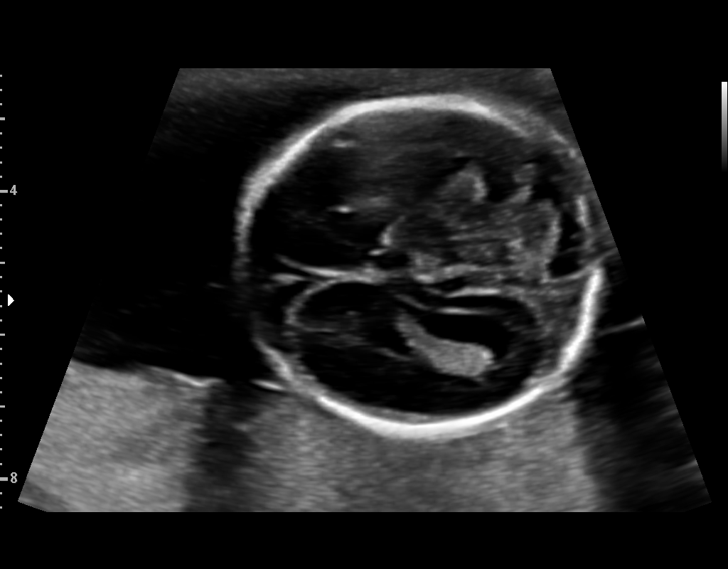
[im 25/97]
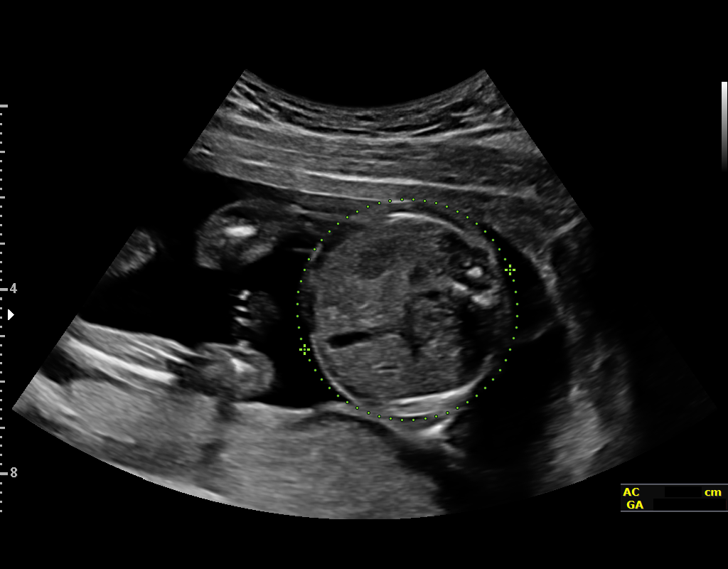
[im 33/97]
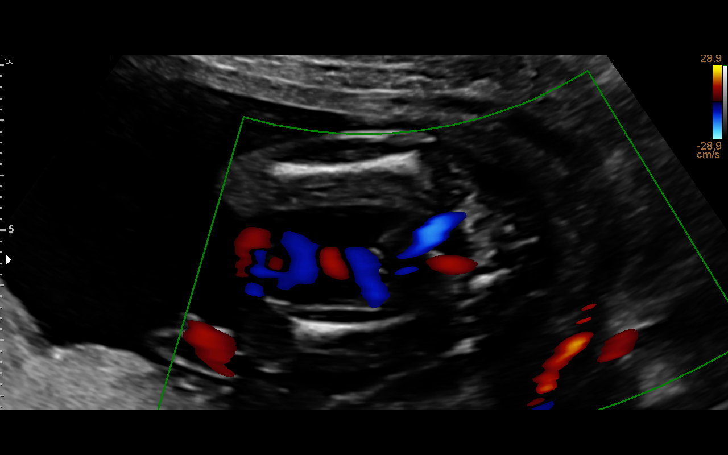
[im 40/97]
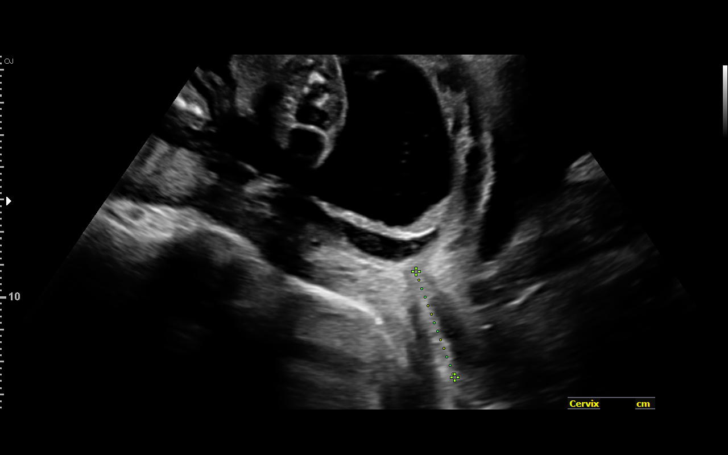
[im 50/97]
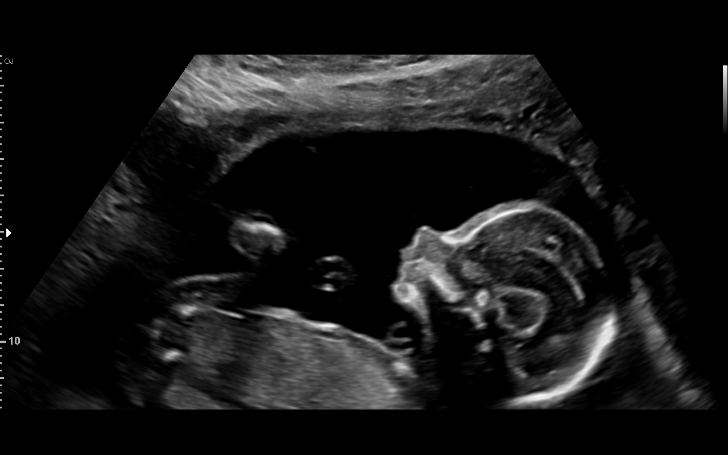
[im 57/97]
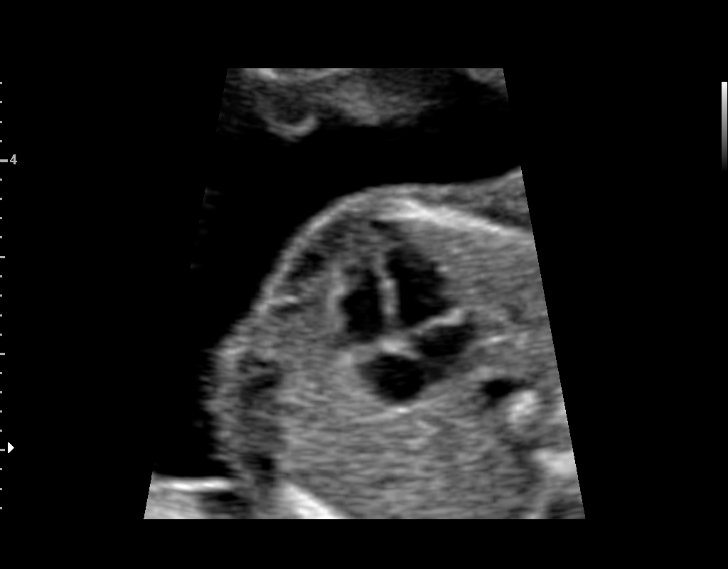
[im 65/97]
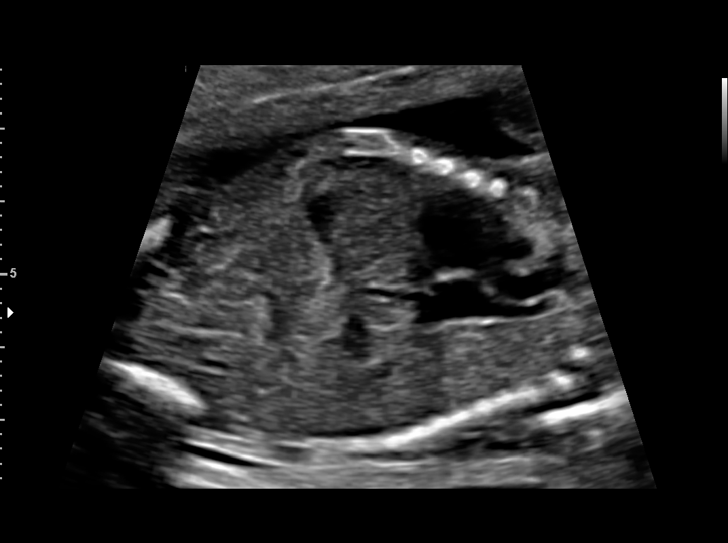
[im 72/97]
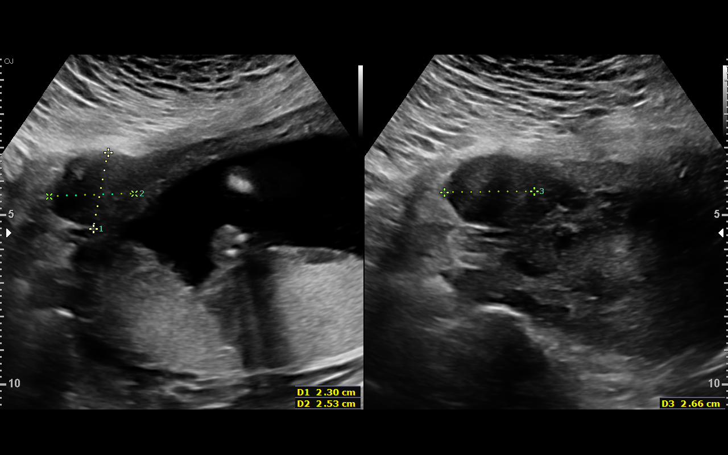
[im 79/97]
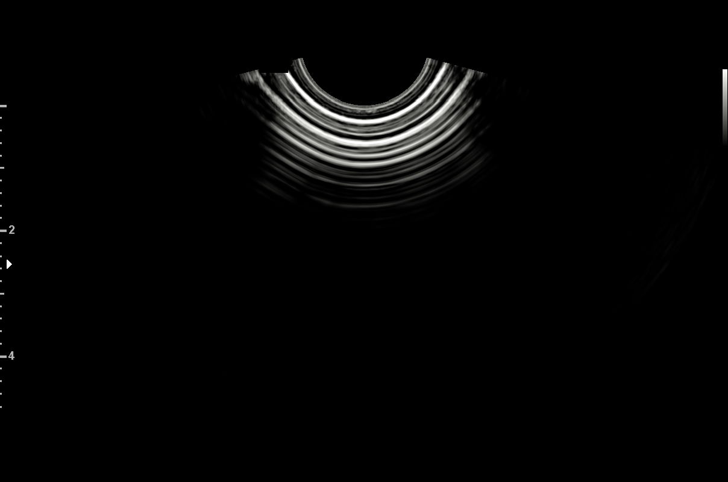
[im 86/97]
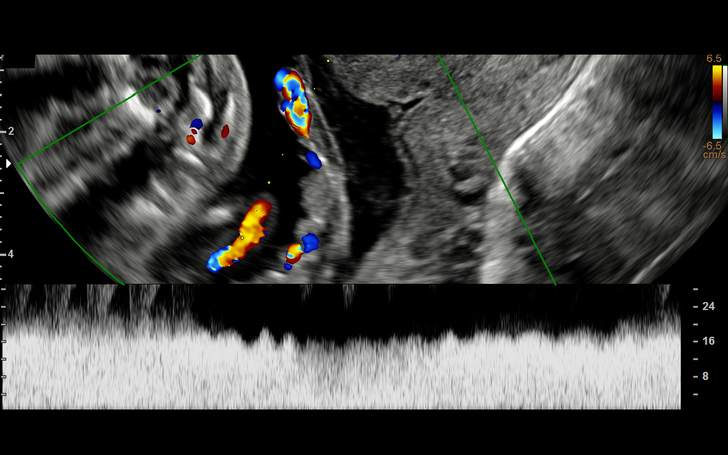
[im 93/97]
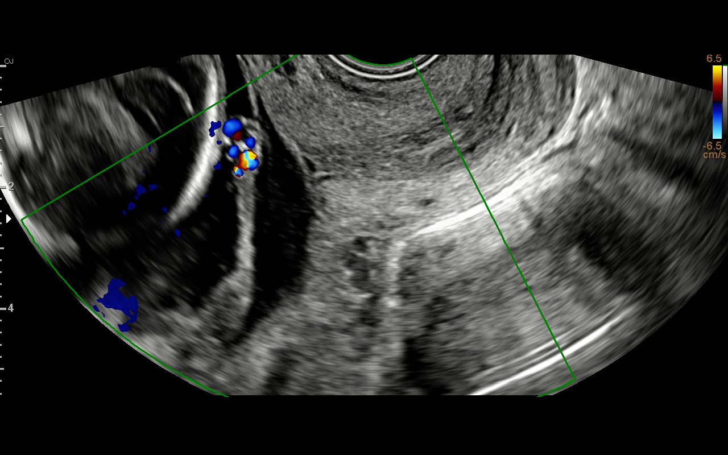

[13 of 28 positions shown; findings below may reference images not displayed]

1  MESFIN PETERSON           [id number]      [id number]     [id number]
2  MESFIN PETERSON           [id number]      [id number]     [phone]
Indications

19 weeks gestation of pregnancy
Encounter for antenatal screening for
malformations
Cystic Fibrosis (CF) Carrier, second trimester
Seizure disorder(partial symptomatic
epilepsy)
Uterine fibroid(left)
OB History

Blood Type:            Height:  5'3"   Weight (lb):  158       BMI:
Gravidity:    3         Term:   0        Prem:   0        SAB:   1
TOP:          1       Ectopic:  0        Living: 0
Fetal Evaluation

Num Of Fetuses:     1
Fetal Heart         145
Rate(bpm):
Cardiac Activity:   Observed
Presentation:       Cephalic
Placenta:           Posterior Previa
P. Cord Insertion:  Marginal insertion

Amniotic Fluid
AFI FV:      Subjectively within normal limits

Largest Pocket(cm)
4.1
Comment:    Small subchorionic fluid collection noted near internal os
[DATE]cm
Biometry

BPD:        45  mm     G. Age:  19w 4d         76  %    CI:        78.63   %    70 - 86
FL/HC:      17.8   %    16.1 -
HC:      160.5  mm     G. Age:  18w 6d         37  %    HC/AC:      1.08        1.09 -
AC:      149.2  mm     G. Age:  20w 1d         81  %    FL/BPD:     63.6   %
FL:       28.6  mm     G. Age:  18w 5d         36  %    FL/AC:      19.2   %    20 - 24
HUM:        28  mm     G. Age:  19w 0d         50  %
CER:      19.6  mm     G. Age:  18w 6d         44  %

CM:          4  mm

Est. FW:     294  gm    0 lb 10 oz      52  %
Gestational Age

U/S Today:     19w 2d                                        EDD:   06/30/16
Best:          19w 0d     Det. By:  Early Ultrasound         EDD:   07/02/16
(11/14/15)
Anatomy

Cranium:               Appears normal         Aortic Arch:            Appears normal
Cavum:                 Appears normal         Ductal Arch:            Appears normal
Ventricles:            Appears normal         Diaphragm:              Appears normal
Choroid Plexus:        Appears normal         Stomach:                Appears normal, left
sided
Cerebellum:            Appears normal         Abdomen:                Appears normal
Posterior Fossa:       Appears normal         Abdominal Wall:         Appears nml (cord
insert, abd wall)
Nuchal Fold:           Appears normal         Cord Vessels:           Appears normal (3
vessel cord)
Face:                  Appears normal         Kidneys:                Appear normal
(orbits and profile)
Lips:                  Appears normal         Bladder:                Appears normal
Thoracic:              Appears normal         Spine:                  Appears normal
Heart:                 Appears normal         Upper Extremities:      Appears normal
(4CH, axis, and situs
RVOT:                  Appears normal         Lower Extremities:      Appears normal
LVOT:                  Appears normal

Other:  Fetus appears to be a female. Heels and 5th digit visualized.
Cervix Uterus Adnexa

Cervix
Length:           3.48  cm.
Normal appearance by transabdominal scan.

Uterus
No abnormality visualized.

Left Ovary
Not visualized. No adnexal mass visualized.

Right Ovary
Not visualized. No adnexal mass visualized.
Myomas

Site                     L(cm)      W(cm)      D(cm)      Location
Left

Blood Flow                 RI        PI       Comments

Impression

IUP at 19+0 weeks with continued vaginal bleeding, CF
carrier, and history of partial complex siezure disorder vaginal
bleeding
Normal fetal movement and cardiac activity
Amniotic fluid level normal
No sonographic markers for aneuploidy or structural
anomalies are noted
Cervical length 35 mm transvaginally
Small subchorionic fluid collection noted at internal os,
unchanged from previous scan. Cord insertion is marginal
Recommendations

This patient will need a repeat scan in 4 weeks to assess the
contining dark brown vaginal bleeding as well as the
anomalous cord insertion

## 2016-02-09 ENCOUNTER — Telehealth: Payer: Self-pay

## 2016-02-09 NOTE — Telephone Encounter (Signed)
-----   Message from Mora Bellman, MD sent at 02/06/2016 12:07 PM EST ----- Please inform patient of placenta previa. She should abstain from intercourse. The rest of the ultrasound findings will be discussed at her follow up appointment  Thanks  [facility]

## 2016-02-10 ENCOUNTER — Telehealth: Payer: Self-pay | Admitting: *Deleted

## 2016-02-10 NOTE — Telephone Encounter (Signed)
Patient notified of [place] results and she is aware no IC.

## 2016-02-19 ENCOUNTER — Telehealth: Payer: Self-pay

## 2016-02-19 NOTE — Telephone Encounter (Signed)
Returned call, patient had questions about activity restrictions, answered per provider approval.

## 2016-02-23 NOTE — L&D Delivery Note (Signed)
Patient is 26 y.o. [id number] [redacted]w[redacted]d admitted 05/17/208, no significant PMH.   Upon arrival patient was complete and pushing. She pushed with good maternal effort to deliver a healthy baby girl. Baby delivered without difficulty, was noted to have good tone and place on maternal abdomen for oral suctioning, drying and stimulation. Delayed cord clamping performed. Placenta delivered intact with 3V cord. Vaginal canal and perineum was inspected and repaired; hemostatic. Pitocin was started and uterus massaged until bleeding slowed. Counts of sharps, instruments, and lap pads were all correct.   Delivery Note At 8:39 PM a viable female was delivered via Vaginal, Spontaneous Delivery (Presentation: left occiput; anterior).  APGAR: 8, 9; weight  .   Placenta status: complete, intact.  Cord:  3 vessel with the following complications: none.  Cord pH: n/a  Anesthesia:  Epidural Episiotomy: None Lacerations:  Second degree Suture Repair: 3.0 vicryl Est. Blood Loss (mL):  300 mL  Mom to postpartum.  Baby to Couplet care / Skin to Skin.  Adin Hector, MD PGY-2 07/08/2016, 9:20 PM

## 2016-03-04 ENCOUNTER — Encounter: Payer: Medicaid Other | Admitting: Certified Nurse Midwife

## 2016-03-05 ENCOUNTER — Ambulatory Visit (HOSPITAL_COMMUNITY)
Admission: RE | Admit: 2016-03-05 | Discharge: 2016-03-05 | Disposition: A | Discharge status: Home / Self Care | Payer: Medicaid Other | Source: Ambulatory Visit | Attending: Obstetrics and Gynecology | Admitting: Obstetrics and Gynecology

## 2016-03-05 ENCOUNTER — Other Ambulatory Visit (HOSPITAL_COMMUNITY): Payer: Self-pay | Admitting: Obstetrics and Gynecology

## 2016-03-05 ENCOUNTER — Encounter (HOSPITAL_COMMUNITY): Payer: Self-pay

## 2016-03-05 DIAGNOSIS — O3412 Maternal care for benign tumor of corpus uteri, second trimester: Secondary | ICD-10-CM

## 2016-03-05 DIAGNOSIS — G40909 Epilepsy, unspecified, not intractable, without status epilepticus: Secondary | ICD-10-CM

## 2016-03-05 DIAGNOSIS — Z141 Cystic fibrosis carrier: Secondary | ICD-10-CM | POA: Insufficient documentation

## 2016-03-05 DIAGNOSIS — O09892 Supervision of other high risk pregnancies, second trimester: Secondary | ICD-10-CM

## 2016-03-05 DIAGNOSIS — O4412 Placenta previa with hemorrhage, second trimester: Secondary | ICD-10-CM

## 2016-03-05 DIAGNOSIS — O321XX0 Maternal care for breech presentation, not applicable or unspecified: Secondary | ICD-10-CM | POA: Insufficient documentation

## 2016-03-05 DIAGNOSIS — Z3A23 23 weeks gestation of pregnancy: Secondary | ICD-10-CM

## 2016-03-05 DIAGNOSIS — O468X2 Other antepartum hemorrhage, second trimester: Secondary | ICD-10-CM | POA: Insufficient documentation

## 2016-03-05 DIAGNOSIS — O99352 Diseases of the nervous system complicating pregnancy, second trimester: Secondary | ICD-10-CM | POA: Diagnosis not present

## 2016-03-05 DIAGNOSIS — O43122 Velamentous insertion of umbilical cord, second trimester: Secondary | ICD-10-CM | POA: Insufficient documentation

## 2016-03-05 DIAGNOSIS — D259 Leiomyoma of uterus, unspecified: Secondary | ICD-10-CM

## 2016-03-05 DIAGNOSIS — O4692 Antepartum hemorrhage, unspecified, second trimester: Secondary | ICD-10-CM

## 2016-03-05 IMAGING — US US MFM OB FOLLOW-UP
1 series · 14 of 28 positions shown · non-contrast
Comparison: none

[Series 1: us mfm ob follow-up · 107 acquisitions, 14 frames shown]
[im 4/107]
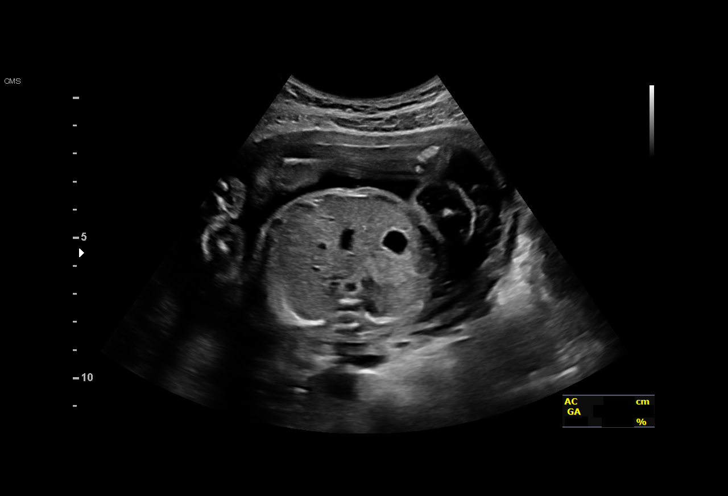
[im 12/107]
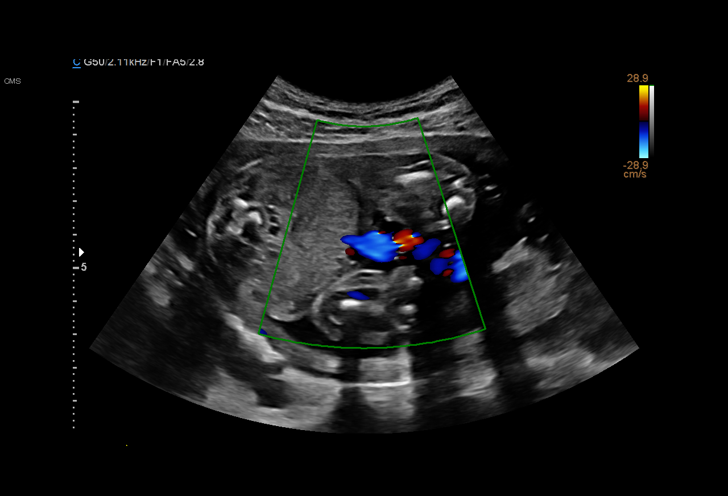
[im 20/107]
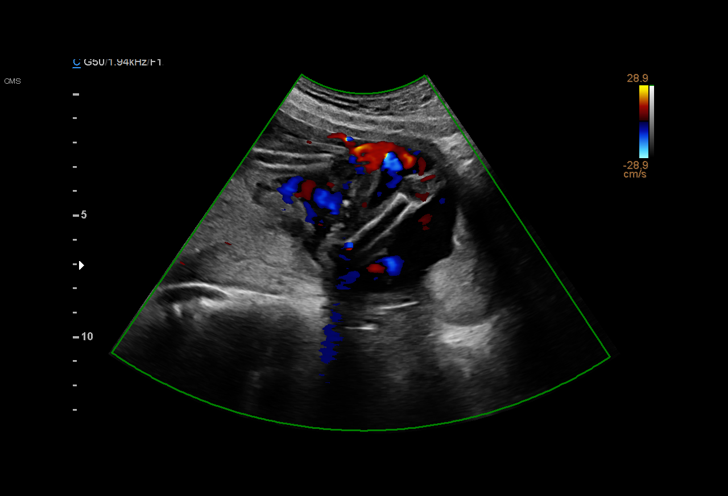
[im 28/107]
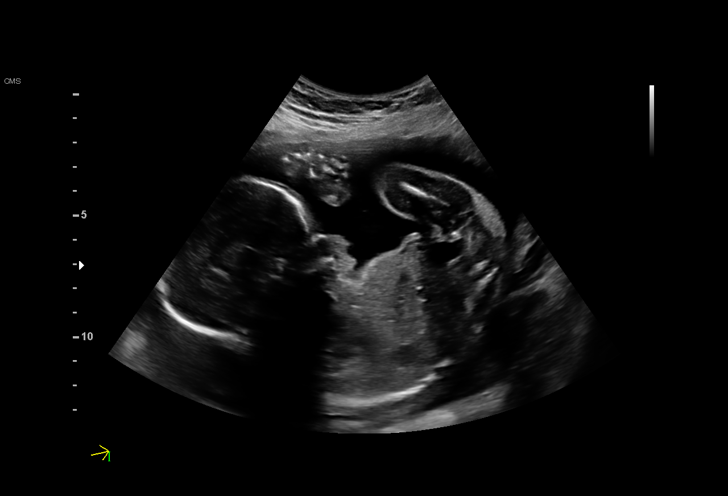
[im 36/107]
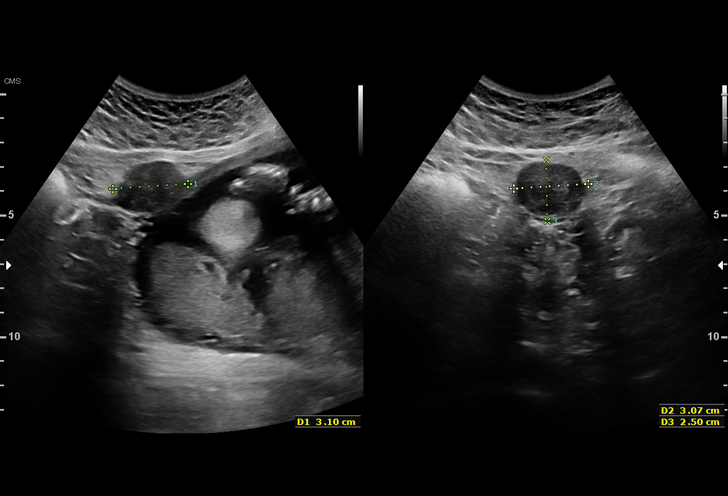
[im 44/107]
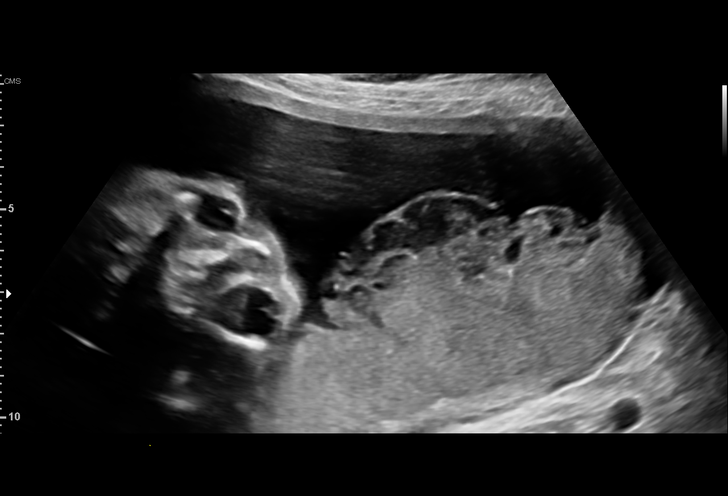
[im 52/107]
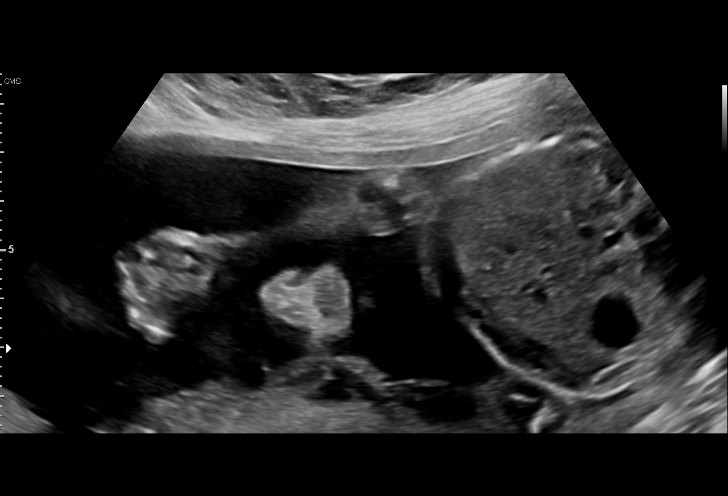
[im 59/107]
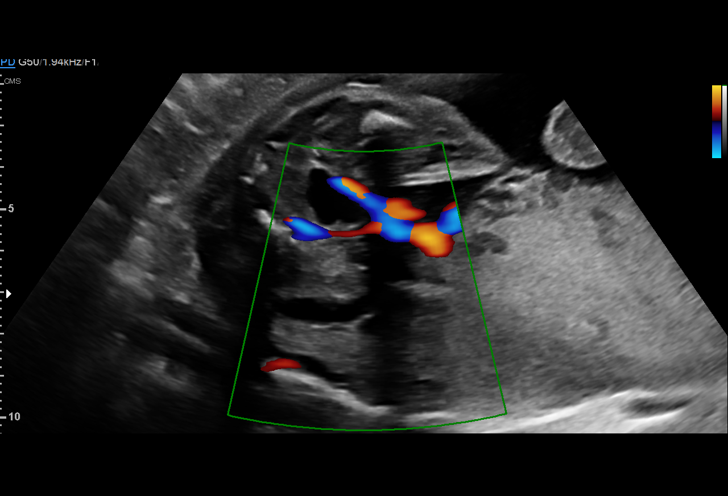
[im 67/107]
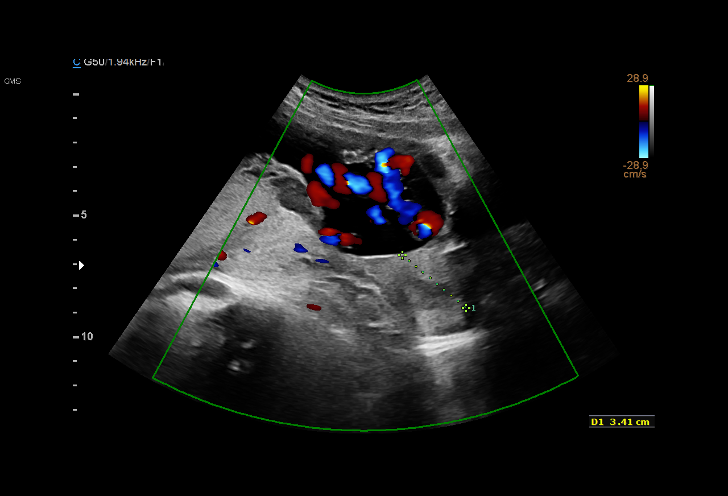
[im 75/107]
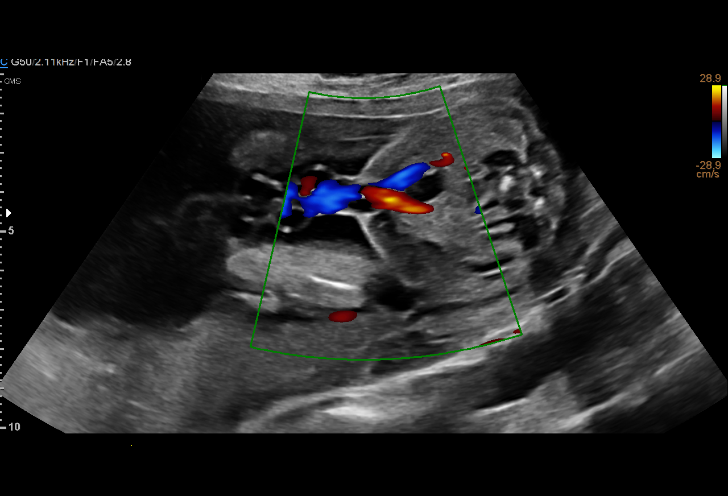
[im 83/107]
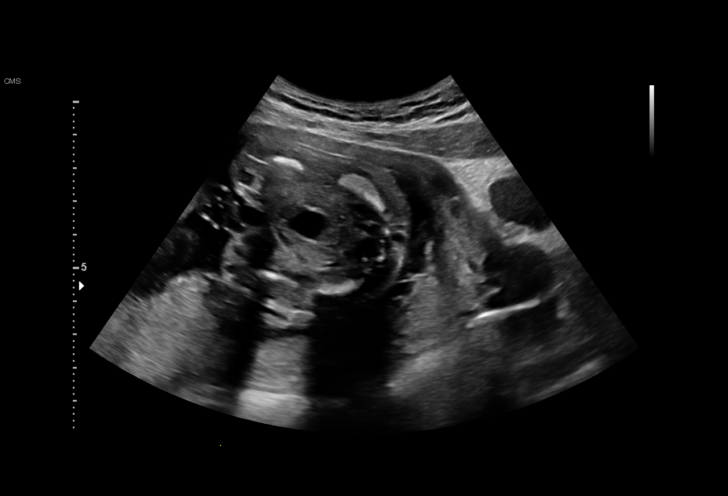
[im 91/107]
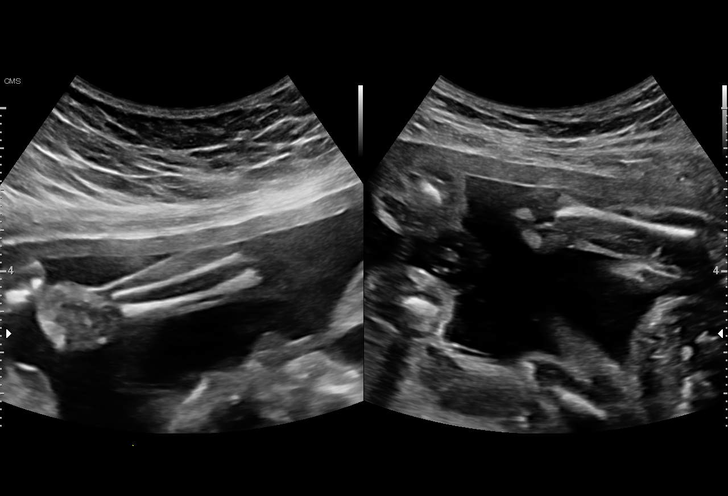
[im 99/107]
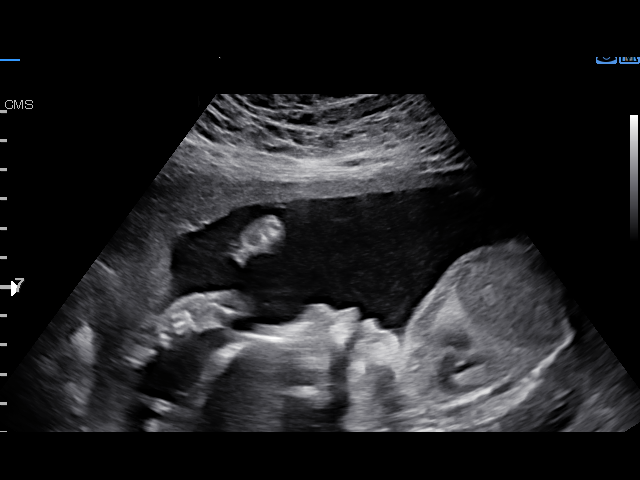
[im 107/107]
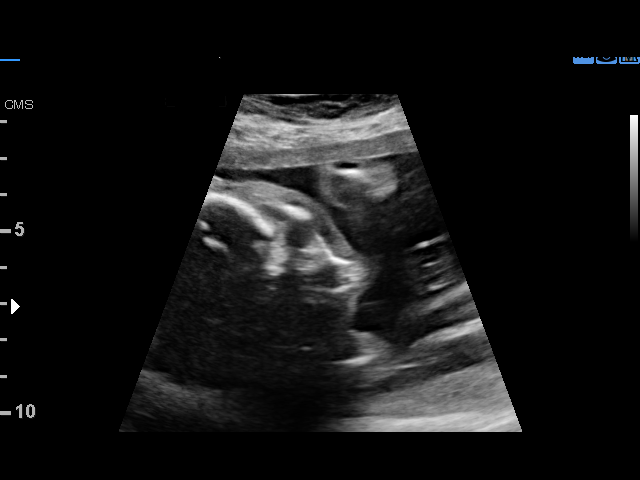

[14 of 28 positions shown; findings below may reference images not displayed]

MAU/Triage

1  GEICHA LEE DE LA OYA              [id number]      [id number]     [id number]
Indications

23 weeks gestation of pregnancy
Cystic Fibrosis (CF) Carrier, second
trimester; per pt, FOB to have carrier
screening thru primary DEANTE
Seizure disorder (partial symptomatic
epilepsy)
Uterine fibroid (left)
Placenta previa with hemorrhage, second
trimester
OB History

Blood Type:            Height:  5'3"   Weight (lb):  158      BMI:
Gravidity:    3         Term:   0        Prem:   0        SAB:   1
TOP:          1       Ectopic:  0        Living: 0
Fetal Evaluation

Num Of Fetuses:     1
Fetal Heart         146
Rate(bpm):
Cardiac Activity:   Observed
Presentation:       Breech
Placenta:           Posterior, above cervical os
P. Cord Insertion:  ? Velamentous insertion

Amniotic Fluid
AFI FV:      Subjectively within normal limits

Largest Pocket(cm)
6.37
Biometry

BPD:      58.1  mm     G. Age:  23w 6d         75  %    CI:        74.47   %   70 - 86
FL/HC:      17.7   %   19.2 -
HC:      213.7  mm     G. Age:  23w 3d         53  %    HC/AC:      1.10       1.05 -
AC:      194.5  mm     G. Age:  24w 1d         77  %    FL/BPD:     65.1   %   71 - 87
FL:       37.8  mm     G. Age:  22w 1d         14  %    FL/AC:      19.4   %   20 - 24
HUM:        38  mm     G. Age:  23w 3d         50  %
CER:      25.3  mm     G. Age:  23w 2d         55  %
CM:        8.2  mm

Est. FW:     581  gm      1 lb 4 oz     58  %
Gestational Age

U/S Today:     23w 3d                                        EDD:   06/29/16
Best:          23w 0d    Det. By:   Early Ultrasound         EDD:   07/02/16
(11/14/15)
Anatomy

Cranium:               Appears normal         Aortic Arch:            Previously seen
Cavum:                 Appears normal         Ductal Arch:            Previously seen
Ventricles:            Appears normal         Diaphragm:              Appears normal
Choroid Plexus:        Appears normal         Stomach:                Appears normal, left
sided
Cerebellum:            Appears normal         Abdomen:                Appears normal
Posterior Fossa:       Appears normal         Abdominal Wall:         Appears nml (cord
insert, abd wall)
Nuchal Fold:           Previously seen        Cord Vessels:           Appears normal (3
vessel cord)
Face:                  Orbits nl; profile     Kidneys:                Appear normal
prev visualized
Lips:                  Previously seen        Bladder:                Appears normal
Thoracic:              Appears normal         Spine:                  Previously seen
Heart:                 Previously seen        Upper Extremities:      Previously seen
RVOT:                  Appears normal         Lower Extremities:      Previously seen
LVOT:                  Appears normal

Other:  Fetus appears to be a female. 5th digits visualized. Open hands
visualized. Nasal bone visualized. Heels previously visualized.
Cervix Uterus Adnexa

Cervix
Normal appearance by transabdominal scan.

Uterus
Single fibroid noted, see table below.

Left Ovary
Within normal limits.

Right Ovary
Within normal limits.

Cul De Sac:   No free fluid seen.

Adnexa:       No abnormality visualized.
Myomas

Site                     L(cm)      W(cm)      D(cm)      Location
Fundal Left

Blood Flow                 RI        PI       Comments

Impression

SIUP at 23+0 weeks
Normal interval anatomy; anatomic survey complete
Normal amniotic fluid volume
Appropriate interval growth with EFW at the 58th %tile
Posterior placenta; no previa; small SCH remains; ?
velamentous cord insertion site
Fibroid uterus: see above for size and location
Recommendations

Follow-up ultrasound for growth on [DATE]

## 2016-03-09 ENCOUNTER — Other Ambulatory Visit (HOSPITAL_COMMUNITY): Payer: Self-pay | Admitting: *Deleted

## 2016-03-09 DIAGNOSIS — O43109 Malformation of placenta, unspecified, unspecified trimester: Secondary | ICD-10-CM

## 2016-03-10 ENCOUNTER — Ambulatory Visit: Payer: Medicaid Other | Admitting: Neurology

## 2016-03-17 ENCOUNTER — Ambulatory Visit (INDEPENDENT_AMBULATORY_CARE_PROVIDER_SITE_OTHER): Payer: Medicaid Other | Admitting: Obstetrics & Gynecology

## 2016-03-17 ENCOUNTER — Encounter: Payer: Self-pay | Admitting: Obstetrics

## 2016-03-17 DIAGNOSIS — O0990 Supervision of high risk pregnancy, unspecified, unspecified trimester: Secondary | ICD-10-CM

## 2016-03-17 DIAGNOSIS — O0992 Supervision of high risk pregnancy, unspecified, second trimester: Secondary | ICD-10-CM

## 2016-03-17 NOTE — Progress Notes (Signed)
   PRENATAL VISIT NOTE  Subjective:  Norissa Seeds is a 26 y.o. G3P0020 at [redacted]w[redacted]d being seen today for ongoing prenatal care.  She is currently monitored for the following issues for this high-risk pregnancy and has Seizure disorder ([place]); Supervision of high risk pregnancy, antepartum; Allergic rhinitis; Asthma, mild intermittent; Panic anxiety syndrome; Cystic fibrosis carrier, antepartum; Complex partial seizure ([place]); Vaginal bleeding in pregnancy, second trimester; Placenta previa; and Marginal insertion of umbilical cord affecting management of mother on her problem list.  Patient reports no complaints.  Contractions: Not present. Vag. Bleeding: None.  Movement: Present. Denies leaking of fluid.   The following portions of the patient's history were reviewed and updated as appropriate: allergies, current medications, past family history, past medical history, past social history, past surgical history and problem list. Problem list updated.  Objective:   Vitals:   03/17/16 1444  BP: 120/83  Pulse: (!) 117  Temp: 98 F (36.7 C)  Weight: 172 lb 8 oz (78.2 kg)    Fetal Status:     Movement: Present     General:  Alert, oriented and cooperative. Patient is in no acute distress.  Skin: Skin is warm and dry. No rash noted.   Cardiovascular: Normal heart rate noted  Respiratory: Normal respiratory effort, no problems with respiration noted  Abdomen: Soft, gravid, appropriate for gestational age. Pain/Pressure: Absent     Pelvic:  Cervical exam deferred        Extremities: Normal range of motion.  Edema: Trace  Mental Status: Normal mood and affect. Normal behavior. Normal judgment and thought content.   Assessment and Plan:  Pregnancy: [id number] at 107w5d  1. Supervision of high risk pregnancy, antepartum   Preterm labor symptoms and general obstetric precautions including but not limited to vaginal bleeding, contractions, leaking of fluid and fetal movement were reviewed in detail  with the patient. Please refer to After Visit Summary for other counseling recommendations.  No Follow-up on file.   Emily Filbert, MD

## 2016-04-14 ENCOUNTER — Other Ambulatory Visit (HOSPITAL_COMMUNITY)
Admission: RE | Admit: 2016-04-14 | Discharge: 2016-04-14 | Disposition: A | Discharge status: Home / Self Care | Payer: Medicaid Other | Source: Ambulatory Visit | Attending: Certified Nurse Midwife | Admitting: Certified Nurse Midwife

## 2016-04-14 ENCOUNTER — Other Ambulatory Visit: Payer: Medicaid Other

## 2016-04-14 ENCOUNTER — Ambulatory Visit (INDEPENDENT_AMBULATORY_CARE_PROVIDER_SITE_OTHER): Payer: Medicaid Other | Admitting: Certified Nurse Midwife

## 2016-04-14 VITALS — BP 110/76 | HR 110

## 2016-04-14 DIAGNOSIS — O99513 Diseases of the respiratory system complicating pregnancy, third trimester: Secondary | ICD-10-CM

## 2016-04-14 DIAGNOSIS — O99353 Diseases of the nervous system complicating pregnancy, third trimester: Secondary | ICD-10-CM

## 2016-04-14 DIAGNOSIS — Z113 Encounter for screening for infections with a predominantly sexual mode of transmission: Secondary | ICD-10-CM | POA: Insufficient documentation

## 2016-04-14 DIAGNOSIS — O99619 Diseases of the digestive system complicating pregnancy, unspecified trimester: Secondary | ICD-10-CM

## 2016-04-14 DIAGNOSIS — Z3480 Encounter for supervision of other normal pregnancy, unspecified trimester: Secondary | ICD-10-CM

## 2016-04-14 DIAGNOSIS — O4403 Placenta previa specified as without hemorrhage, third trimester: Secondary | ICD-10-CM

## 2016-04-14 DIAGNOSIS — O09899 Supervision of other high risk pregnancies, unspecified trimester: Secondary | ICD-10-CM

## 2016-04-14 DIAGNOSIS — O0990 Supervision of high risk pregnancy, unspecified, unspecified trimester: Secondary | ICD-10-CM

## 2016-04-14 DIAGNOSIS — Z141 Cystic fibrosis carrier: Secondary | ICD-10-CM

## 2016-04-14 DIAGNOSIS — K219 Gastro-esophageal reflux disease without esophagitis: Secondary | ICD-10-CM

## 2016-04-14 DIAGNOSIS — G40909 Epilepsy, unspecified, not intractable, without status epilepticus: Secondary | ICD-10-CM

## 2016-04-14 DIAGNOSIS — J4520 Mild intermittent asthma, uncomplicated: Secondary | ICD-10-CM

## 2016-04-14 MED ORDER — OMEPRAZOLE 20 MG PO CPDR: 20.0000 mg | DELAYED_RELEASE_CAPSULE | Freq: Two times a day (BID) | ORAL | 5 refills | Status: DC

## 2016-04-14 MED ORDER — ALBUTEROL SULFATE HFA 108 (90 BASE) MCG/ACT IN AERS: 2.0000 | INHALATION_SPRAY | Freq: Four times a day (QID) | RESPIRATORY_TRACT | 1 refills | Status: DC | PRN

## 2016-04-14 NOTE — Progress Notes (Signed)
   PRENATAL VISIT NOTE  Subjective:  Stephanie Yu is a 26 y.o. G3P0020 at [redacted]w[redacted]d being seen today for ongoing prenatal care.  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  Patient reports no complaints.  Contractions: Not present. Vag. Bleeding: None.  Movement: Present. Denies leaking of fluid.   The following portions of the patient's history were reviewed and updated as appropriate: allergies, current medications, past family history, past medical history, past social history, past surgical history and problem list. Problem list updated.  Objective:   Vitals:   04/14/16 0851  BP: 110/76  Pulse: (!) 110    Fetal Status: Fetal Heart Rate (bpm): 145 Fundal Height: 28 cm Movement: Present     General:  Alert, oriented and cooperative. Patient is in no acute distress.  Skin: Skin is warm and dry. No rash noted.   Cardiovascular: Normal heart rate noted  Respiratory: Normal respiratory effort, no problems with respiration noted  Abdomen: Soft, gravid, appropriate for gestational age. Pain/Pressure: Absent     Pelvic:  Cervical exam deferred        Extremities: Normal range of motion.     Mental Status: Normal mood and affect. Normal behavior. Normal judgment and thought content.   Assessment and Plan:  Pregnancy: [id number] at [redacted]w[redacted]d  1. Supervision of high risk pregnancy, antepartum       Taking Ativan twice a week for panic attacks.  F/U US growth scheduled for 04/19/16. - Cervicovaginal ancillary only - Glucose Tolerance, 2 Hours w/1 Hour - CBC - HIV antibody - RPR  2. Seizure  disorder ([place])     Taken off Lamictal by neurology last seizure was last May 2017.    3. Mild intermittent asthma without complication   - albuterol (PROVENTIL HFA;VENTOLIN HFA) 108 (90 Base) MCG/ACT inhaler; Inhale 2 puffs into the lungs every 6 (six) hours as needed for wheezing or shortness of breath.  Dispense: 18 g; Refill: 1  4. Placenta previa in third trimester     Resolved on [place] 03/05/16  5. Cystic fibrosis carrier, antepartum       6. Gastroesophageal reflux during pregnancy, antepartum     OTC Tums.  - omeprazole (PRILOSEC) 20 MG capsule; Take 1 capsule (20 mg total) by mouth 2 (two) times daily before a meal.  Dispense: 60 capsule; Refill: 5  Preterm labor symptoms and general obstetric precautions including but not limited to vaginal bleeding, contractions, leaking of fluid and fetal movement were reviewed in detail with the patient. Please refer to After Visit Summary for other counseling recommendations.  Return in about 2 weeks (around 04/28/2016) for [facility].   Morene Crocker, CNM

## 2016-04-15 LAB — CBC
HEMATOCRIT: 34.9 % (ref 34.0–46.6)
Hemoglobin: 12.5 g/dL (ref 11.1–15.9)
MCH: 32.5 pg (ref 26.6–33.0)
MCHC: 35.8 g/dL — ABNORMAL HIGH (ref 31.5–35.7)
MCV: 91 fL (ref 79–97)
PLATELETS: 244 10*3/uL (ref 150–379)
RBC: 3.85 x10E6/uL (ref 3.77–5.28)
RDW: 13.4 % (ref 12.3–15.4)
WBC: 10.4 10*3/uL (ref 3.4–10.8)

## 2016-04-15 LAB — GLUCOSE TOLERANCE, 2 HOURS W/ 1HR
GLUCOSE, FASTING: 81 mg/dL (ref 65–91)
Glucose, 1 hour: 116 mg/dL (ref 65–179)
Glucose, 2 hour: 45 mg/dL — ABNORMAL LOW (ref 65–152)

## 2016-04-15 LAB — RPR: RPR Ser Ql: NONREACTIVE

## 2016-04-15 LAB — HIV ANTIBODY (ROUTINE TESTING W REFLEX): HIV Screen 4th Generation wRfx: NONREACTIVE

## 2016-04-15 LAB — CERVICOVAGINAL ANCILLARY ONLY
Chlamydia: NEGATIVE
NEISSERIA GONORRHEA: NEGATIVE
Trichomonas: NEGATIVE

## 2016-04-16 ENCOUNTER — Other Ambulatory Visit: Payer: Self-pay | Admitting: Certified Nurse Midwife

## 2016-04-16 DIAGNOSIS — O0990 Supervision of high risk pregnancy, unspecified, unspecified trimester: Secondary | ICD-10-CM

## 2016-04-19 ENCOUNTER — Ambulatory Visit (HOSPITAL_COMMUNITY)
Admission: RE | Admit: 2016-04-19 | Discharge: 2016-04-19 | Disposition: A | Discharge status: Home / Self Care | Payer: Medicaid Other | Source: Ambulatory Visit | Attending: Obstetrics and Gynecology | Admitting: Obstetrics and Gynecology

## 2016-04-19 ENCOUNTER — Encounter (HOSPITAL_COMMUNITY): Payer: Self-pay

## 2016-04-19 DIAGNOSIS — G40909 Epilepsy, unspecified, not intractable, without status epilepticus: Secondary | ICD-10-CM | POA: Insufficient documentation

## 2016-04-19 DIAGNOSIS — O3413 Maternal care for benign tumor of corpus uteri, third trimester: Secondary | ICD-10-CM | POA: Insufficient documentation

## 2016-04-19 DIAGNOSIS — Z3A29 29 weeks gestation of pregnancy: Secondary | ICD-10-CM | POA: Diagnosis not present

## 2016-04-19 DIAGNOSIS — O4413 Placenta previa with hemorrhage, third trimester: Secondary | ICD-10-CM | POA: Diagnosis not present

## 2016-04-19 DIAGNOSIS — O09892 Supervision of other high risk pregnancies, second trimester: Secondary | ICD-10-CM | POA: Diagnosis not present

## 2016-04-19 DIAGNOSIS — O99353 Diseases of the nervous system complicating pregnancy, third trimester: Secondary | ICD-10-CM | POA: Diagnosis not present

## 2016-04-19 DIAGNOSIS — O43109 Malformation of placenta, unspecified, unspecified trimester: Secondary | ICD-10-CM

## 2016-04-19 IMAGING — US US MFM OB FOLLOW-UP
1 series · 14 of 28 positions shown · non-contrast
Comparison: none

[Series 1: us mfm ob follow-up · 57 acquisitions, 14 frames shown]
[im 3/57]
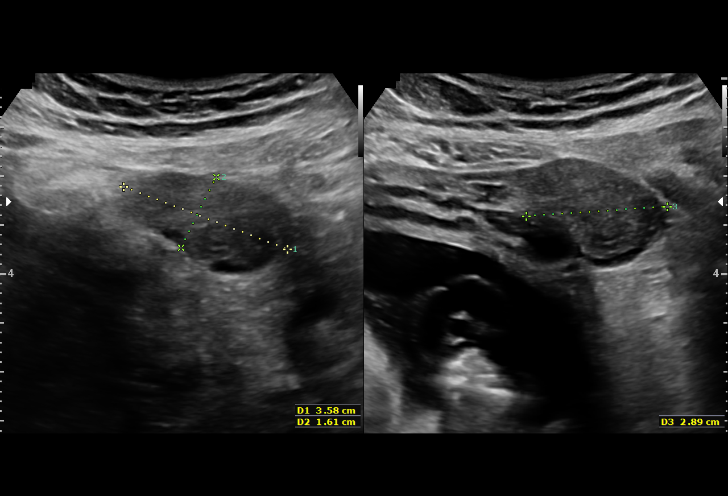
[im 7/57]
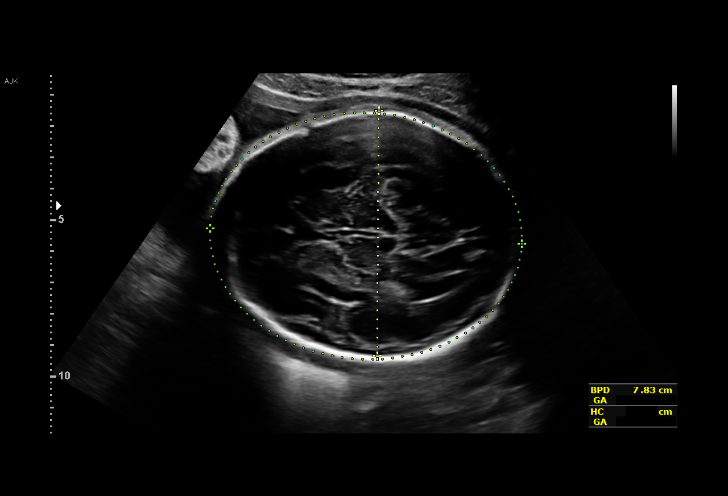
[im 11/57]
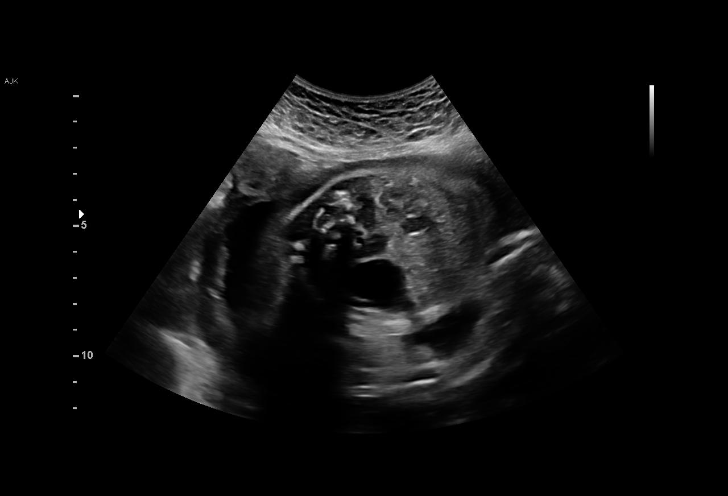
[im 15/57]
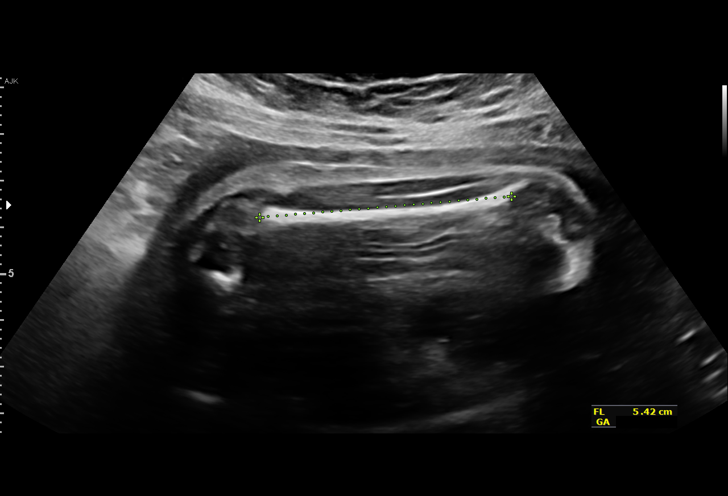
[im 19/57]
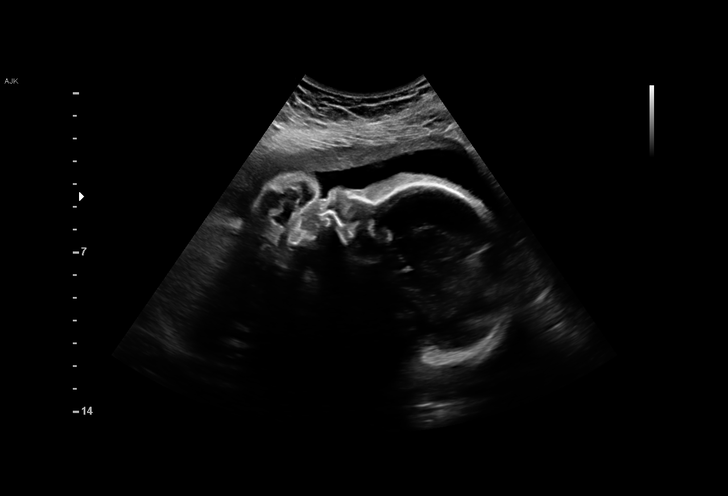
[im 23/57]
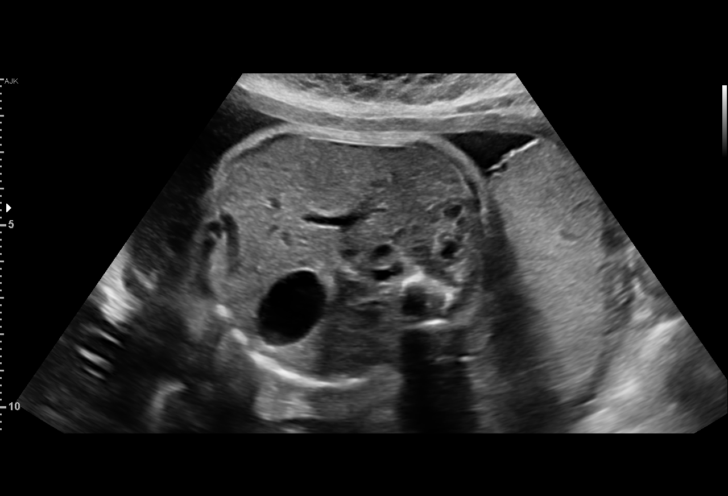
[im 27/57]
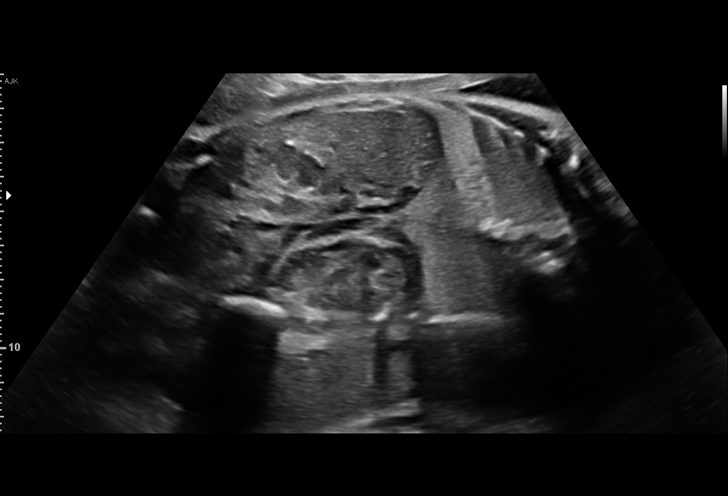
[im 32/57]
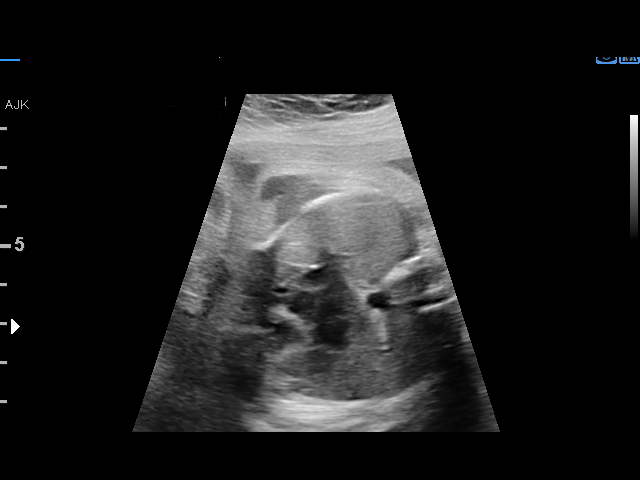
[im 36/57]
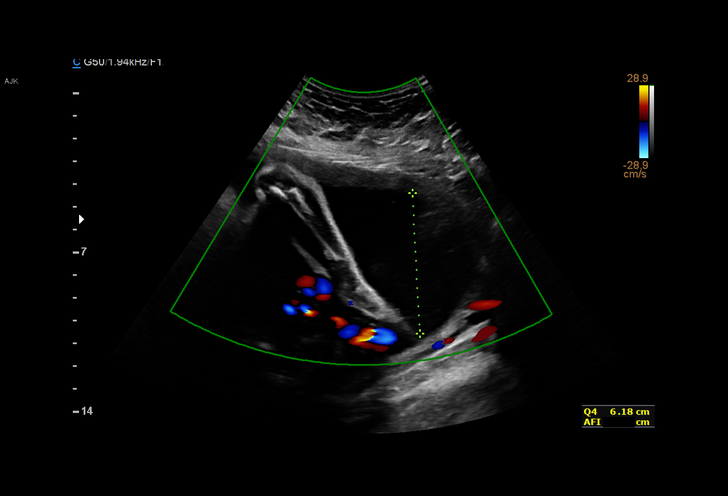
[im 40/57]
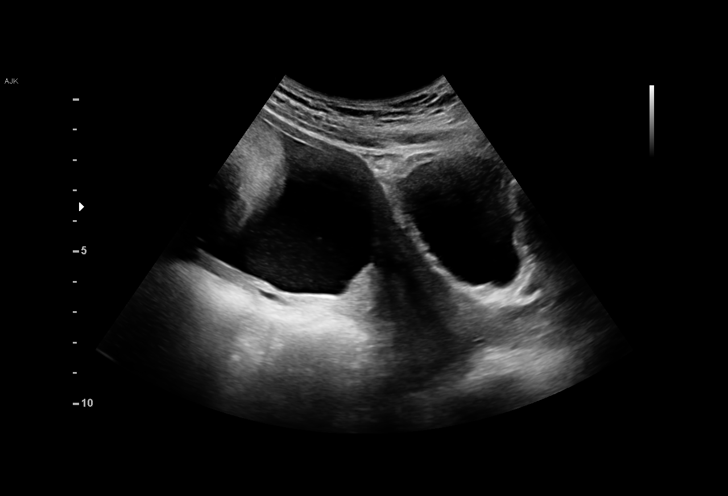
[im 44/57]
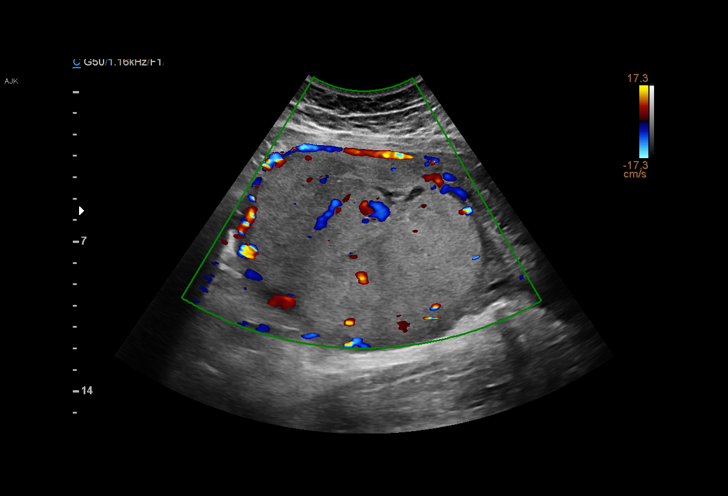
[im 48/57]
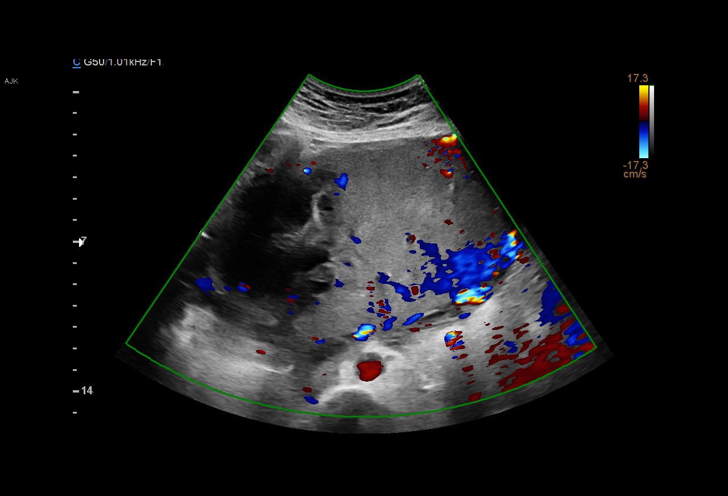
[im 52/57]
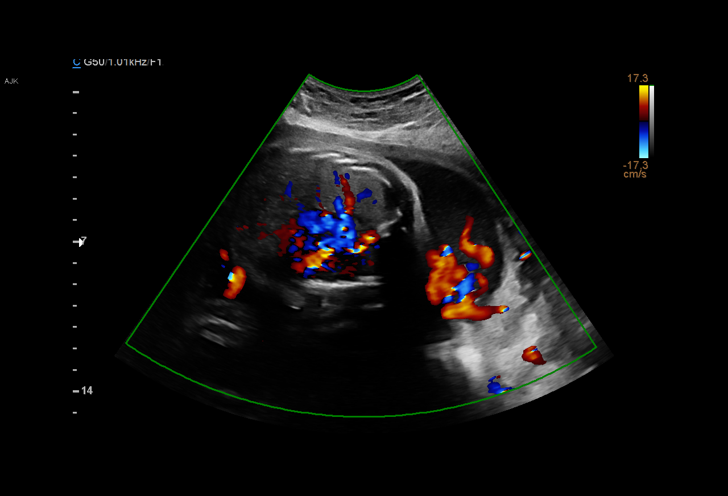
[im 57/57]
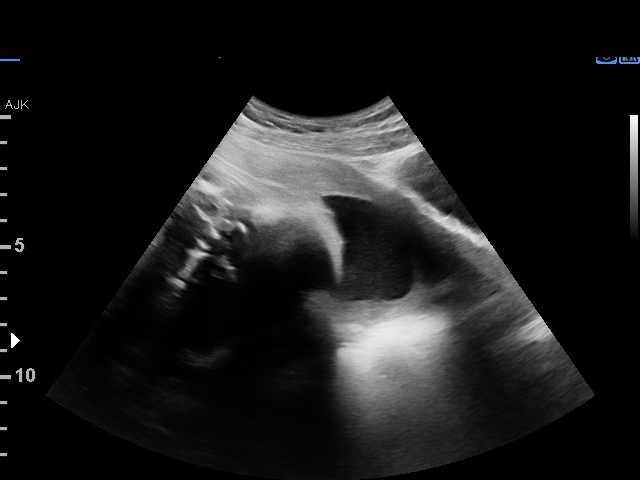

[14 of 28 positions shown; findings below may reference images not displayed]

MAU/Triage

1  PITYUKA HEDVIG            [id number]      [id number]     [id number]
Indications

29 weeks gestation of pregnancy
Cystic Fibrosis (CF) Carrier, second
trimester; per pt, FOB to have carrier
screening thru primary [facility]
Seizure disorder (partial symptomatic
epilepsy)
Uterine fibroid (left)
Placenta previa with hemorrhage, third
trimester
OB History

Blood Type:            Height:  5'3"   Weight (lb):  158      BMI:
Gravidity:    3         Term:   0        Prem:   0        SAB:   1
TOP:          1       Ectopic:  0        Living: 0
Fetal Evaluation

Num Of Fetuses:     1
Fetal Heart         137
Rate(bpm):
Cardiac Activity:   Observed
Presentation:       Cephalic
Placenta:           Posterior, above cervical os
P. Cord Insertion:  Velamentous insertion

Amniotic Fluid
AFI FV:      Subjectively within normal limits

AFI Sum(cm)     %Tile       Largest Pocket(cm)
15.28           54
RUQ(cm)       RLQ(cm)       LUQ(cm)        LLQ(cm)
2.36
Biometry

BPD:      77.9  mm     G. Age:  31w 2d         88  %    CI:         76.2   %   70 - 86
FL/HC:      18.9   %   19.6 -
HC:      282.8  mm     G. Age:  31w 0d         63  %    HC/AC:      1.03       0.99 -
AC:       274   mm     G. Age:  31w 4d         92  %    FL/BPD:     68.7   %   71 - 87
FL:       53.5  mm     G. Age:  28w 3d         12  %    FL/AC:      19.5   %   20 - 24

Est. FW:    9836  gm      3 lb 8 oz     72  %
Gestational Age

U/S Today:     30w 4d                                        EDD:   06/24/16
Best:          29w 3d    Det. By:   Early Ultrasound         EDD:   07/02/16
(11/14/15)
Anatomy

Cranium:               Appears normal         Aortic Arch:            Previously seen
Cavum:                 Appears normal         Ductal Arch:            Previously seen
Ventricles:            Appears normal         Diaphragm:              Appears normal
Choroid Plexus:        Previously seen        Stomach:                Appears normal, left
sided
Cerebellum:            Previously seen        Abdomen:                Appears normal
Posterior Fossa:       Previously seen        Abdominal Wall:         Previously seen
Nuchal Fold:           Previously seen        Cord Vessels:           Previously seen
Face:                  Orbits and profile     Kidneys:                Appear normal
previously seen
Lips:                  Appears normal         Bladder:                Appears normal
Thoracic:              Appears normal         Spine:                  Previously seen
Heart:                 Appears normal         Upper Extremities:      Previously seen
(4CH, axis, and situs
RVOT:                  Previously seen        Lower Extremities:      Previously seen
LVOT:                  Previously seen

Other:  Fetus appears to be a female. 5th digit previously seen. Open hands
visualized. Nasal bone visualized. Heels previously visualized.
Cervix Uterus Adnexa

Cervix
Length:            3.8  cm.
Normal appearance by transabdominal scan.

Uterus
Single fibroid noted, see table below.

Left Ovary
Within normal limits.

Right Ovary
Within normal limits.

Adnexa:       No abnormality visualized.
Myomas

Site                     L(cm)      W(cm)      D(cm)      Location
Left fundal

Blood Flow                 RI        PI       Comments

Impression

SIUP at 29+3 weeks with history of vaginal bleeding, previa,
and velamentous insertion
Normal interval anatomy; anatomic survey complete
Normal amniotic fluid volume
Appropriate interval growth with EFW at the 72nd percentile
Posterior placenta; no previa; no evidence of retroplacental or
subchorionic hemorrhage. Velamentous cord insertion site
not contiguous with cervix

Recommendations

Given resolution of bleeding and previa, no further US visits
are necessary. Follow-up ultrasounds as clinically indicated.

## 2016-04-19 NOTE — Addendum Note (Signed)
Encounter addended by: Jeanene Erb on: 04/19/2016 10:14 AM<BR>    Actions taken: Imaging Exam ended

## 2016-04-29 ENCOUNTER — Ambulatory Visit (INDEPENDENT_AMBULATORY_CARE_PROVIDER_SITE_OTHER): Payer: Medicaid Other | Admitting: Obstetrics & Gynecology

## 2016-04-29 VITALS — BP 126/75 | HR 111 | Wt 184.9 lb

## 2016-04-29 DIAGNOSIS — Z23 Encounter for immunization: Secondary | ICD-10-CM | POA: Diagnosis not present

## 2016-04-29 DIAGNOSIS — O43123 Velamentous insertion of umbilical cord, third trimester: Secondary | ICD-10-CM

## 2016-04-29 DIAGNOSIS — O0990 Supervision of high risk pregnancy, unspecified, unspecified trimester: Secondary | ICD-10-CM

## 2016-04-29 NOTE — Patient Instructions (Signed)
Return to clinic for any scheduled appointments or obstetric concerns, or go to MAU for evaluation  

## 2016-04-29 NOTE — Progress Notes (Signed)
PRENATAL VISIT NOTE  Subjective:  Stephanie Yu is a 26 y.o. G3P0020 at [redacted]w[redacted]d being seen today for ongoing prenatal care.  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  Patient reports no complaints.  Contractions: Not present. Vag. Bleeding: None.  Movement: Present. Denies leaking of fluid.   The following portions of the patient's history were reviewed and updated as appropriate: allergies, current medications, past family history, past medical history, past social history, past surgical history and problem list. Problem list updated.  Objective:   Vitals:   04/29/16 1615  BP: 126/75  Pulse: (!) 111  Weight: 184 lb 14.4 oz (83.9 kg)    Fetal Status: Fetal Heart Rate (bpm): 145 Fundal Height: 31 cm Movement: Present     General:  Alert, oriented and cooperative. Patient is in no acute distress.  Skin: Skin is warm and dry. No rash noted.   Cardiovascular: Normal heart rate noted  Respiratory: Normal respiratory effort, no problems with respiration noted  Abdomen: Soft, gravid, appropriate for gestational age. Pain/Pressure: Absent     Pelvic:  Cervical exam deferred        Extremities: Normal range of motion.  Edema: Trace  Mental Status: Normal mood and affect. Normal behavior. Normal judgment and thought content.    [place] Mfm Ob Follow Up  Result Date: 04/19/2016 ----------------------------------------------------------------------  OBSTETRICS REPORT                      (Signed Final 04/19/2016 09:42 am) ---------------------------------------------------------------------- Patient Info  ID #:        [id number]                         D.O.B.:   October 30, 1990 (25 yrs)  Name:       [facility]                  Visit Date:  04/19/2016 08:34 am ---------------------------------------------------------------------- Performed By  Performed By:     Jodelle Green          Ref. Address:     [address]                    [place], [place]  [place]  Attending:        Griffin Dakin MD         Secondary Phy.:   MAU Nursing-                                                             MAU/Triage  Referred By:      Vickii Chafe                  Location:         [facility]                    CONSTANT MD ---------------------------------------------------------------------- Orders   #  Description                                 Code   1  [place] MFM OB FOLLOW UP                         [phone]  ----------------------------------------------------------------------   #  Ordered By               Order #        Accession #    Episode #   1  Renella Cunas            [id number]      [id number]     [id number]  ---------------------------------------------------------------------- Indications   [redacted] weeks gestation of pregnancy                Z3A.29   Cystic Fibrosis (CF) Carrier, second           O09.892   trimester; per pt, FOB to have carrier   screening thru primary MD   Seizure disorder (partial symptomatic          O99.350 G40.909   epilepsy)   Uterine fibroid (left)                         O34.10   Placenta previa with hemorrhage, third         O44.13   trimester  ---------------------------------------------------------------------- OB History  Blood Type:            Height:  5'3"   Weight (lb):  158      BMI:   27.99  Gravidity:    3         Term:   0        Prem:   0        SAB:   1  TOP:          1       Ectopic:  0        Living: 0  ---------------------------------------------------------------------- Fetal Evaluation  Num Of Fetuses:     1  Fetal Heart         137  Rate(bpm):  Cardiac Activity:   Observed  Presentation:       Cephalic  Placenta:           Posterior, above cervical os  P. Cord Insertion:  Velamentous insertion  Amniotic Fluid  AFI FV:      Subjectively within normal  limits  AFI Sum(cm)     %Tile       Largest Pocket(cm)  15.28           54          6.18  RUQ(cm)       RLQ(cm)       LUQ(cm)        LLQ(cm)  2.36          6.18          3.3            3.44 ---------------------------------------------------------------------- Biometry  BPD:      77.9  mm     G. Age:  31w 2d         88  %    CI:         76.2   %   70 - 86                                                          FL/HC:      18.9   %   19.6 - 20.8  HC:      282.8  mm     G. Age:  31w 0d         63  %    HC/AC:      1.03       0.99 - 1.21  AC:       274   mm     G. Age:  31w 4d         92  %    FL/BPD:     68.7   %   71 - 87  FL:       53.5  mm     G. Age:  28w 3d         12  %    FL/AC:      19.5   %   20 - 24  Est. FW:    1576  gm      3 lb 8 oz     72  % ---------------------------------------------------------------------- Gestational Age  U/S Today:     30w 4d                                        EDD:   06/24/16  Best:          29w 3d    Det. ByLoman Chroman         EDD:   07/02/16                                      (11/14/15) ---------------------------------------------------------------------- Anatomy  Cranium:               Appears normal         Aortic Arch:            Previously seen  Cavum:                 Appears normal         Ductal Arch:  Previously seen  Ventricles:            Appears normal         Diaphragm:              Appears normal  Choroid Plexus:        Previously seen        Stomach:                Appears normal, left                                                                        sided  Cerebellum:             Previously seen        Abdomen:                Appears normal  Posterior Fossa:       Previously seen        Abdominal Wall:         Previously seen  Nuchal Fold:           Previously seen        Cord Vessels:           Previously seen  Face:                  Orbits and profile     Kidneys:                Appear normal                         previously seen  Lips:                  Appears normal         Bladder:                Appears normal  Thoracic:              Appears normal         Spine:                  Previously seen  Heart:                 Appears normal         Upper Extremities:      Previously seen                         (4CH, axis, and situs  RVOT:                  Previously seen        Lower Extremities:      Previously seen  LVOT:                  Previously seen  Other:  Fetus appears to be a female. 5th digit previously seen. Open hands          visualized. Nasal bone visualized. Heels previously visualized. ---------------------------------------------------------------------- Cervix Uterus Adnexa  Cervix  Length:            3.8  cm.  Normal appearance by  transabdominal scan.  Uterus  Single fibroid noted, see table below.  Left Ovary  Within normal limits.  Right Ovary  Within normal limits.  Adnexa:       No abnormality visualized. ---------------------------------------------------------------------- Myomas   Site                     L(cm)      W(cm)      D(cm)      Location   Left fundal              3.1        2.1        3.4  ----------------------------------------------------------------------   Blood Flow                 RI        PI       Comments  ---------------------------------------------------------------------- Impression  SIUP at 29+3 weeks with history of vaginal bleeding, previa,  and velamentous insertion  Normal interval anatomy; anatomic survey complete  Normal amniotic fluid volume  Appropriate interval growth with EFW at the 72nd percentile  Posterior placenta; no  previa; no evidence of retroplacental or  subchorionic hemorrhage. Velamentous cord insertion site  not contiguous with cervix ---------------------------------------------------------------------- Recommendations  Given resolution of bleeding and previa, no further [place] visits  are necessary. Follow-up ultrasounds as clinically indicated. ----------------------------------------------------------------------                 Griffin Dakin, MD Electronically Signed Final Report   04/19/2016 09:42 am ----------------------------------------------------------------------   Assessment and Plan:  Pregnancy: [id number] at [redacted]w[redacted]d  1. Velamentous insertion of umbilical cord in third trimester Velamentous cord insertion site not contiguous with cervix.  Given resolution of bleeding and previa, no further [place] visits are necessary.  Follow-up ultrasounds as clinically indicated  2. Need for Tdap vaccination - Tdap vaccine greater than or equal to 7yo IM given today.  3. Supervision of high risk pregnancy, antepartum Preterm labor symptoms and general obstetric precautions including but not limited to vaginal bleeding, contractions, leaking of fluid and fetal movement were reviewed in detail with the patient. Please refer to After Visit Summary for other counseling recommendations.  Return in about 2 weeks (around 05/13/2016) for OB Visit.   Osborne Oman, MD

## 2016-05-03 ENCOUNTER — Other Ambulatory Visit: Payer: Self-pay | Admitting: Certified Nurse Midwife

## 2016-05-03 ENCOUNTER — Telehealth: Payer: Self-pay | Admitting: *Deleted

## 2016-05-03 DIAGNOSIS — J4520 Mild intermittent asthma, uncomplicated: Secondary | ICD-10-CM

## 2016-05-03 MED ORDER — ALBUTEROL SULFATE HFA 108 (90 BASE) MCG/ACT IN AERS: 1.0000 | INHALATION_SPRAY | Freq: Four times a day (QID) | RESPIRATORY_TRACT | 99 refills | Status: DC | PRN

## 2016-05-03 NOTE — Telephone Encounter (Signed)
Proventil HFA sent to the pharmacy

## 2016-05-03 NOTE — Telephone Encounter (Signed)
Ventolin in non preferred- choices are Proair HFA or Proventil HFA

## 2016-05-18 ENCOUNTER — Ambulatory Visit (INDEPENDENT_AMBULATORY_CARE_PROVIDER_SITE_OTHER): Payer: Medicaid Other | Admitting: Obstetrics & Gynecology

## 2016-05-18 VITALS — BP 107/74 | HR 112 | Wt 190.0 lb

## 2016-05-18 DIAGNOSIS — O0993 Supervision of high risk pregnancy, unspecified, third trimester: Secondary | ICD-10-CM

## 2016-05-18 DIAGNOSIS — O0990 Supervision of high risk pregnancy, unspecified, unspecified trimester: Secondary | ICD-10-CM

## 2016-05-18 NOTE — Patient Instructions (Signed)

## 2016-05-18 NOTE — Progress Notes (Signed)
   PRENATAL VISIT NOTE  Subjective:  Stephanie Yu is a 26 y.o. G3P0020 at [redacted]w[redacted]d being seen today for ongoing prenatal care.  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  Patient reports no complaints.  Contractions: Not present. Vag. Bleeding: None.  Movement: Present. Denies leaking of fluid.   The following portions of the patient's history were reviewed and updated as appropriate: allergies, current medications, past family history, past medical history, past social history, past surgical history and problem list. Problem list updated.  Objective:   Vitals:   05/18/16 0835  BP: 107/74  Pulse: (!) 112  Weight: 190 lb (86.2 kg)    Fetal Status: Fetal Heart Rate (bpm): 140   Movement: Present     General:  Alert, oriented and cooperative. Patient is in no acute distress.  Skin: Skin is warm and dry. No rash noted.   Cardiovascular: Normal heart rate noted  Respiratory: Normal respiratory effort, no problems with respiration noted  Abdomen: Soft, gravid, appropriate for gestational age. Pain/Pressure: Present     Pelvic:  Cervical exam deferred        Extremities: Normal range of motion.  Edema: None  Mental Status: Normal mood and affect. Normal behavior. Normal judgment and thought content.   Assessment and Plan:  Pregnancy: [id number] at [redacted]w[redacted]d  1. Supervision of high risk pregnancy, antepartum No placenta previa  Preterm labor symptoms and general obstetric precautions including but not limited to vaginal bleeding, contractions, leaking of fluid and fetal movement were reviewed in detail with the  patient. Please refer to After Visit Summary for other counseling recommendations.  Return in about 2 weeks (around 06/01/2016).   Woodroe Mode, MD

## 2016-06-01 ENCOUNTER — Other Ambulatory Visit (HOSPITAL_COMMUNITY)
Admission: RE | Admit: 2016-06-01 | Discharge: 2016-06-01 | Disposition: A | Discharge status: Home / Self Care | Payer: Medicaid Other | Source: Ambulatory Visit | Attending: Obstetrics and Gynecology | Admitting: Obstetrics and Gynecology

## 2016-06-01 ENCOUNTER — Encounter: Payer: Self-pay | Admitting: Obstetrics and Gynecology

## 2016-06-01 ENCOUNTER — Ambulatory Visit (INDEPENDENT_AMBULATORY_CARE_PROVIDER_SITE_OTHER): Payer: Medicaid Other | Admitting: Obstetrics and Gynecology

## 2016-06-01 VITALS — BP 116/84 | HR 93 | Wt 179.0 lb

## 2016-06-01 DIAGNOSIS — O0993 Supervision of high risk pregnancy, unspecified, third trimester: Secondary | ICD-10-CM | POA: Insufficient documentation

## 2016-06-01 DIAGNOSIS — Z141 Cystic fibrosis carrier: Secondary | ICD-10-CM

## 2016-06-01 DIAGNOSIS — O09899 Supervision of other high risk pregnancies, unspecified trimester: Secondary | ICD-10-CM

## 2016-06-01 DIAGNOSIS — Z3A35 35 weeks gestation of pregnancy: Secondary | ICD-10-CM | POA: Insufficient documentation

## 2016-06-01 DIAGNOSIS — O0990 Supervision of high risk pregnancy, unspecified, unspecified trimester: Secondary | ICD-10-CM

## 2016-06-01 DIAGNOSIS — O43123 Velamentous insertion of umbilical cord, third trimester: Secondary | ICD-10-CM

## 2016-06-01 LAB — OB RESULTS CONSOLE GBS: STREP GROUP B AG: NEGATIVE

## 2016-06-01 NOTE — Progress Notes (Signed)
Patient reports fetal movement, complains of itching and swelling in feet and ankles

## 2016-06-01 NOTE — Patient Instructions (Signed)
Third Trimester of Pregnancy The third trimester is from week 28 through week 40 (months 7 through 9). The third trimester is a time when the unborn baby (fetus) is growing rapidly. At the end of the ninth month, the fetus is about 20 inches in length and weighs 6-10 pounds. Body changes during your third trimester Your body will continue to go through many changes during pregnancy. The changes vary from woman to woman. During the third trimester:  Your weight will continue to increase. You can expect to gain 25-35 pounds (11-16 kg) by the end of the pregnancy.  You may begin to get stretch marks on your hips, abdomen, and breasts.  You may urinate more often because the fetus is moving lower into your pelvis and pressing on your bladder.  You may develop or continue to have heartburn. This is caused by increased hormones that slow down muscles in the digestive tract.  You may develop or continue to have constipation because increased hormones slow digestion and cause the muscles that push waste through your intestines to relax.  You may develop hemorrhoids. These are swollen veins (varicose veins) in the rectum that can itch or be painful.  You may develop swollen, bulging veins (varicose veins) in your legs.  You may have increased body aches in the pelvis, back, or thighs. This is due to weight gain and increased hormones that are relaxing your joints.  You may have changes in your hair. These can include thickening of your hair, rapid growth, and changes in texture. Some women also have hair loss during or after pregnancy, or hair that feels dry or thin. Your hair will most likely return to normal after your baby is born.  Your breasts will continue to grow and they will continue to become tender. A yellow fluid (colostrum) may leak from your breasts. This is the first milk you are producing for your baby.  Your belly button may stick out.  You may notice more swelling in your hands,  face, or ankles.  You may have increased tingling or numbness in your hands, arms, and legs. The skin on your belly may also feel numb.  You may feel short of breath because of your expanding uterus.  You may have more problems sleeping. This can be caused by the size of your belly, increased need to urinate, and an increase in your body's metabolism.  You may notice the fetus "dropping," or moving lower in your abdomen (lightening).  You may have increased vaginal discharge.  You may notice your joints feel loose and you may have pain around your pelvic bone.  What to expect at prenatal visits You will have prenatal exams every 2 weeks until week 36. Then you will have weekly prenatal exams. During a routine prenatal visit:  You will be weighed to make sure you and the baby are growing normally.  Your blood pressure will be taken.  Your abdomen will be measured to track your baby's growth.  The fetal heartbeat will be listened to.  Any test results from the previous visit will be discussed.  You may have a cervical check near your due date to see if your cervix has softened or thinned (effaced).  You will be tested for Group B streptococcus. This happens between 35 and 37 weeks.  Your health care provider may ask you:  What your birth plan is.  How you are feeling.  If you are feeling the baby move.  If you have had   any abnormal symptoms, such as leaking fluid, bleeding, severe headaches, or abdominal cramping.  If you are using any tobacco products, including cigarettes, chewing tobacco, and electronic cigarettes.  If you have any questions.  Other tests or screenings that may be performed during your third trimester include:  Blood tests that check for low iron levels (anemia).  Fetal testing to check the health, activity level, and growth of the fetus. Testing is done if you have certain medical conditions or if there are problems during the  pregnancy.  Nonstress test (NST). This test checks the health of your baby to make sure there are no signs of problems, such as the baby not getting enough oxygen. During this test, a belt is placed around your belly. The baby is made to move, and its heart rate is monitored during movement.  What is false labor? False labor is a condition in which you feel small, irregular tightenings of the muscles in the womb (contractions) that usually go away with rest, changing position, or drinking water. These are called Braxton Hicks contractions. Contractions may last for hours, days, or even weeks before true labor sets in. If contractions come at regular intervals, become more frequent, increase in intensity, or become painful, you should see your health care provider. What are the signs of labor?  Abdominal cramps.  Regular contractions that start at 10 minutes apart and become stronger and more frequent with time.  Contractions that start on the top of the uterus and spread down to the lower abdomen and back.  Increased pelvic pressure and dull back pain.  A watery or bloody mucus discharge that comes from the vagina.  Leaking of amniotic fluid. This is also known as your "water breaking." It could be a slow trickle or a gush. Let your health care provider know if it has a color or strange odor. If you have any of these signs, call your health care provider right away, even if it is before your due date. Follow these instructions at home: Medicines  Follow your health care provider's instructions regarding medicine use. Specific medicines may be either safe or unsafe to take during pregnancy.  Take a prenatal vitamin that contains at least 600 micrograms (mcg) of folic acid.  If you develop constipation, try taking a stool softener if your health care provider approves. Eating and drinking  Eat a balanced diet that includes fresh fruits and vegetables, whole grains, good sources of protein  such as meat, eggs, or tofu, and low-fat dairy. Your health care provider will help you determine the amount of weight gain that is right for you.  Avoid raw meat and uncooked cheese. These carry germs that can cause birth defects in the baby.  If you have low calcium intake from food, talk to your health care provider about whether you should take a daily calcium supplement.  Eat four or five small meals rather than three large meals a day.  Limit foods that are high in fat and processed sugars, such as fried and sweet foods.  To prevent constipation: ? Drink enough fluid to keep your urine clear or pale yellow. ? Eat foods that are high in fiber, such as fresh fruits and vegetables, whole grains, and beans. Activity  Exercise only as directed by your health care provider. Most women can continue their usual exercise routine during pregnancy. Try to exercise for 30 minutes at least 5 days a week. Stop exercising if you experience uterine contractions.  Avoid heavy   lifting.  Do not exercise in extreme heat or humidity, or at high altitudes.  Wear low-heel, comfortable shoes.  Practice good posture.  You may continue to have sex unless your health care provider tells you otherwise. Relieving pain and discomfort  Take frequent breaks and rest with your legs elevated if you have leg cramps or low back pain.  Take warm sitz baths to soothe any pain or discomfort caused by hemorrhoids. Use hemorrhoid cream if your health care provider approves.  Wear a good support bra to prevent discomfort from breast tenderness.  If you develop varicose veins: ? Wear support pantyhose or compression stockings as told by your healthcare provider. ? Elevate your feet for 15 minutes, 3-4 times a day. Prenatal care  Write down your questions. Take them to your prenatal visits.  Keep all your prenatal visits as told by your health care provider. This is important. Safety  Wear your seat belt at  all times when driving.  Make a list of emergency phone numbers, including numbers for family, friends, the hospital, and police and fire departments. General instructions  Avoid cat litter boxes and soil used by cats. These carry germs that can cause birth defects in the baby. If you have a cat, ask someone to clean the litter box for you.  Do not travel far distances unless it is absolutely necessary and only with the approval of your health care provider.  Do not use hot tubs, steam rooms, or saunas.  Do not drink alcohol.  Do not use any products that contain nicotine or tobacco, such as cigarettes and e-cigarettes. If you need help quitting, ask your health care provider.  Do not use any medicinal herbs or unprescribed drugs. These chemicals affect the formation and growth of the baby.  Do not douche or use tampons or scented sanitary pads.  Do not cross your legs for long periods of time.  To prepare for the arrival of your baby: ? Take prenatal classes to understand, practice, and ask questions about labor and delivery. ? Make a trial run to the hospital. ? Visit the hospital and tour the maternity area. ? Arrange for maternity or paternity leave through employers. ? Arrange for family and friends to take care of pets while you are in the hospital. ? Purchase a rear-facing car seat and make sure you know how to install it in your car. ? Pack your hospital bag. ? Prepare the baby's nursery. Make sure to remove all pillows and stuffed animals from the baby's crib to prevent suffocation.  Visit your dentist if you have not gone during your pregnancy. Use a soft toothbrush to brush your teeth and be gentle when you floss. Contact a health care provider if:  You are unsure if you are in labor or if your water has broken.  You become dizzy.  You have mild pelvic cramps, pelvic pressure, or nagging pain in your abdominal area.  You have lower back pain.  You have persistent  nausea, vomiting, or diarrhea.  You have an unusual or bad smelling vaginal discharge.  You have pain when you urinate. Get help right away if:  Your water breaks before 37 weeks.  You have regular contractions less than 5 minutes apart before 37 weeks.  You have a fever.  You are leaking fluid from your vagina.  You have spotting or bleeding from your vagina.  You have severe abdominal pain or cramping.  You have rapid weight loss or weight gain.    You have shortness of breath with chest pain.  You notice sudden or extreme swelling of your face, hands, ankles, feet, or legs.  Your baby makes fewer than 10 movements in 2 hours.  You have severe headaches that do not go away when you take medicine.  You have vision changes. Summary  The third trimester is from week 28 through week 40, months 7 through 9. The third trimester is a time when the unborn baby (fetus) is growing rapidly.  During the third trimester, your discomfort may increase as you and your baby continue to gain weight. You may have abdominal, leg, and back pain, sleeping problems, and an increased need to urinate.  During the third trimester your breasts will keep growing and they will continue to become tender. A yellow fluid (colostrum) may leak from your breasts. This is the first milk you are producing for your baby.  False labor is a condition in which you feel small, irregular tightenings of the muscles in the womb (contractions) that eventually go away. These are called Braxton Hicks contractions. Contractions may last for hours, days, or even weeks before true labor sets in.  Signs of labor can include: abdominal cramps; regular contractions that start at 10 minutes apart and become stronger and more frequent with time; watery or bloody mucus discharge that comes from the vagina; increased pelvic pressure and dull back pain; and leaking of amniotic fluid. This information is not intended to replace advice  given to you by your health care provider. Make sure you discuss any questions you have with your health care provider. Document Released: 02/02/2001 Document Revised: 07/17/2015 Document Reviewed: 04/11/2012 Elsevier Interactive Patient Education  2017 [employer]  

## 2016-06-01 NOTE — Progress Notes (Signed)
Subjective:  Stephanie Yu is a 26 y.o. G3P0020 at [redacted]w[redacted]d being seen today for ongoing prenatal care.  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  Patient reports no complaints.  Contractions: Not present. Vag. Bleeding: None.  Movement: Present. Denies leaking of fluid.   The following portions of the patient's history were reviewed and updated as appropriate: allergies, current medications, past family history, past medical history, past social history, past surgical history and problem list. Problem list updated.  Objective:   Vitals:   06/01/16 0831  BP: 116/84  Pulse: 93  Weight: 179 lb (81.2 kg)    Fetal Status: Fetal Heart Rate (bpm): 134   Movement: Present     General:  Alert, oriented and cooperative. Patient is in no acute distress.  Skin: Skin is warm and dry. No rash noted.   Cardiovascular: Normal heart rate noted  Respiratory: Normal respiratory effort, no problems with respiration noted  Abdomen: Soft, gravid, appropriate for gestational age. Pain/Pressure: Absent     Pelvic:  Cervical exam performed        Extremities: Normal range of motion.  Edema: Trace  Mental Status: Normal mood and affect. Normal behavior. Normal judgment and thought content.   Urinalysis:      Assessment and Plan:  Pregnancy: G3P0020 at [redacted]w[redacted]d  1. Supervision of high risk pregnancy, antepartum Pt has lost wt since last visit but reports eating well. Will follow - GC/Chlamydia probe amp ()not at [facility] - Strep Gp B Culture+Rflx Has been off Sx meds almost 1 yr as per neurology. Last Sx was in May 2017  2. Velamentous insertion of umbilical cord in  third trimester No F/U per MFM  3. Cystic fibrosis carrier, antepartum   Preterm labor symptoms and general obstetric precautions including but not limited to vaginal bleeding, contractions, leaking of fluid and fetal movement were reviewed in detail with the patient. Please refer to After Visit Summary for other counseling recommendations.  No Follow-up on file.   Chancy Milroy, MD

## 2016-06-01 NOTE — Addendum Note (Signed)
Addended by: Maryruth Eve on: 06/01/2016 09:30 AM   Modules accepted: Orders

## 2016-06-02 LAB — GC/CHLAMYDIA PROBE AMP (~~LOC~~) NOT AT ARMC
Chlamydia: NEGATIVE
Neisseria Gonorrhea: NEGATIVE

## 2016-06-03 LAB — STREP GP B NAA+RFLX: Strep Gp B NAA+Rflx: NEGATIVE

## 2016-06-08 ENCOUNTER — Ambulatory Visit (INDEPENDENT_AMBULATORY_CARE_PROVIDER_SITE_OTHER): Payer: Medicaid Other | Admitting: Obstetrics and Gynecology

## 2016-06-08 ENCOUNTER — Encounter: Payer: Self-pay | Admitting: Obstetrics and Gynecology

## 2016-06-08 VITALS — BP 131/83 | HR 129 | Wt 187.2 lb

## 2016-06-08 DIAGNOSIS — O0990 Supervision of high risk pregnancy, unspecified, unspecified trimester: Secondary | ICD-10-CM

## 2016-06-08 DIAGNOSIS — Z141 Cystic fibrosis carrier: Secondary | ICD-10-CM

## 2016-06-08 DIAGNOSIS — G40909 Epilepsy, unspecified, not intractable, without status epilepticus: Secondary | ICD-10-CM

## 2016-06-08 DIAGNOSIS — O09893 Supervision of other high risk pregnancies, third trimester: Secondary | ICD-10-CM

## 2016-06-08 DIAGNOSIS — O09899 Supervision of other high risk pregnancies, unspecified trimester: Secondary | ICD-10-CM

## 2016-06-08 DIAGNOSIS — O43123 Velamentous insertion of umbilical cord, third trimester: Secondary | ICD-10-CM

## 2016-06-08 DIAGNOSIS — O99353 Diseases of the nervous system complicating pregnancy, third trimester: Secondary | ICD-10-CM

## 2016-06-08 NOTE — Progress Notes (Signed)
Subjective:  Stephanie Yu is a 26 y.o. G3P0020 at [redacted]w[redacted]d being seen today for ongoing prenatal care.  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  Patient reports occasional contractions.  Contractions: Irritability. Vag. Bleeding: None.  Movement: Present. Denies leaking of fluid.   The following portions of the patient's history were reviewed and updated as appropriate: allergies, current medications, past family history, past medical history, past social history, past surgical history and problem list. Problem list updated.  Objective:   Vitals:   06/08/16 1439  BP: 131/83  Pulse: (!) 129  Weight: 187 lb 3.2 oz (84.9 kg)    Fetal Status: Fetal Heart Rate (bpm): 138   Movement: Present     General:  Alert, oriented and cooperative. Patient is in no acute distress.  Skin: Skin is warm and dry. No rash noted.   Cardiovascular: Normal heart rate noted  Respiratory: Normal respiratory effort, no problems with respiration noted  Abdomen: Soft, gravid, appropriate for gestational age. Pain/Pressure: Present     Pelvic:  Cervical exam deferred        Extremities: Normal range of motion.  Edema: Trace  Mental Status: Normal mood and affect. Normal behavior. Normal judgment and thought content.   Urinalysis:      Assessment and Plan:  Pregnancy: G3P0020 at [redacted]w[redacted]d  1. Velamentous insertion of umbilical cord in third trimester Stable No f/u as per MFM  2. Cystic fibrosis carrier, antepartum   3. Supervision of high risk pregnancy, antepartum Labor precautions  4. Seizure disorder ([place]) No meds or Sz x 1 yr  Term labor symptoms and general obstetric  precautions including but not limited to vaginal bleeding, contractions, leaking of fluid and fetal movement were reviewed in detail with the patient. Please refer to After Visit Summary for other counseling recommendations.  Return in about 1 week (around 06/15/2016) for OB visit.   Chancy Milroy, MD

## 2016-06-08 NOTE — Progress Notes (Signed)
Patient reports she has been having slight increase in swelling of hands and feet- but she is up and around a lot.

## 2016-06-08 NOTE — Patient Instructions (Signed)

## 2016-06-15 ENCOUNTER — Ambulatory Visit (INDEPENDENT_AMBULATORY_CARE_PROVIDER_SITE_OTHER): Payer: Medicaid Other | Admitting: Obstetrics and Gynecology

## 2016-06-15 VITALS — BP 112/81 | HR 114 | Wt 187.2 lb

## 2016-06-15 DIAGNOSIS — O09899 Supervision of other high risk pregnancies, unspecified trimester: Secondary | ICD-10-CM

## 2016-06-15 DIAGNOSIS — G40209 Localization-related (focal) (partial) symptomatic epilepsy and epileptic syndromes with complex partial seizures, not intractable, without status epilepticus: Secondary | ICD-10-CM

## 2016-06-15 DIAGNOSIS — O43123 Velamentous insertion of umbilical cord, third trimester: Secondary | ICD-10-CM

## 2016-06-15 DIAGNOSIS — O0993 Supervision of high risk pregnancy, unspecified, third trimester: Secondary | ICD-10-CM

## 2016-06-15 DIAGNOSIS — O09893 Supervision of other high risk pregnancies, third trimester: Secondary | ICD-10-CM

## 2016-06-15 DIAGNOSIS — Z141 Cystic fibrosis carrier: Secondary | ICD-10-CM

## 2016-06-15 DIAGNOSIS — O0990 Supervision of high risk pregnancy, unspecified, unspecified trimester: Secondary | ICD-10-CM

## 2016-06-15 NOTE — Progress Notes (Signed)
Patient reports fetal movement, denies pain. 

## 2016-06-15 NOTE — Progress Notes (Signed)
   PRENATAL VISIT NOTE  Subjective:  Stephanie Yu is a 26 y.o. G3P0020 at [redacted]w[redacted]d being seen today for ongoing prenatal care.  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  Patient reports no complaints.  Contractions: Not present. Vag. Bleeding: None.  Movement: Present. Denies leaking of fluid.   The following portions of the patient's history were reviewed and updated as appropriate: allergies, current medications, past family history, past medical history, past social history, past surgical history and problem list. Problem list updated.  Objective:   Vitals:   06/15/16 1411  BP: 112/81  Pulse: (!) 114  Weight: 187 lb 3.2 oz (84.9 kg)    Fetal Status: Fetal Heart Rate (bpm): 136 Fundal Height: 37 cm Movement: Present     General:  Alert, oriented and cooperative. Patient is in no acute distress.  Skin: Skin is warm and dry. No rash noted.   Cardiovascular: Normal heart rate noted  Respiratory: Normal respiratory effort, no problems with respiration noted  Abdomen: Soft, gravid, appropriate for gestational age. Pain/Pressure: Absent     Pelvic:  Cervical exam deferred        Extremities: Normal range of motion.  Edema: Trace  Mental Status: Normal mood and affect. Normal behavior. Normal judgment and thought content.   Assessment and Plan:  Pregnancy: [id number] at [redacted]w[redacted]d  1. Supervision of high risk pregnancy, antepartum Patient is doing well without complaints Patient is still looking for a pediatrician  2. Cystic fibrosis carrier, antepartum Declined FOB testing  3. Partial symptomatic epilepsy with complex partial seizures, not intractable,  without status epilepticus ([place]) No seizures for over a year without medication  4. Velamentous insertion of umbilical cord in third trimester Stable. No further follow up required  Term labor symptoms and general obstetric precautions including but not limited to vaginal bleeding, contractions, leaking of fluid and fetal movement were reviewed in detail with the patient. Please refer to After Visit Summary for other counseling recommendations.  Return in about 1 week (around 06/22/2016) for ROB.   Mora Bellman, MD

## 2016-06-21 ENCOUNTER — Encounter (HOSPITAL_COMMUNITY): Payer: Self-pay | Admitting: *Deleted

## 2016-06-21 ENCOUNTER — Inpatient Hospital Stay (HOSPITAL_COMMUNITY)
Admission: AD | Admit: 2016-06-21 | Discharge: 2016-06-21 | Disposition: A | Discharge status: Home / Self Care | Payer: Medicaid Other | Source: Ambulatory Visit | Attending: Family Medicine | Admitting: Family Medicine

## 2016-06-21 DIAGNOSIS — O0990 Supervision of high risk pregnancy, unspecified, unspecified trimester: Secondary | ICD-10-CM

## 2016-06-21 DIAGNOSIS — Z885 Allergy status to narcotic agent status: Secondary | ICD-10-CM | POA: Insufficient documentation

## 2016-06-21 DIAGNOSIS — Z833 Family history of diabetes mellitus: Secondary | ICD-10-CM | POA: Insufficient documentation

## 2016-06-21 DIAGNOSIS — O99513 Diseases of the respiratory system complicating pregnancy, third trimester: Secondary | ICD-10-CM | POA: Insufficient documentation

## 2016-06-21 DIAGNOSIS — Z87891 Personal history of nicotine dependence: Secondary | ICD-10-CM | POA: Insufficient documentation

## 2016-06-21 DIAGNOSIS — Z8249 Family history of ischemic heart disease and other diseases of the circulatory system: Secondary | ICD-10-CM | POA: Insufficient documentation

## 2016-06-21 DIAGNOSIS — O9989 Other specified diseases and conditions complicating pregnancy, childbirth and the puerperium: Secondary | ICD-10-CM | POA: Diagnosis not present

## 2016-06-21 DIAGNOSIS — O23593 Infection of other part of genital tract in pregnancy, third trimester: Secondary | ICD-10-CM | POA: Diagnosis not present

## 2016-06-21 DIAGNOSIS — Z79899 Other long term (current) drug therapy: Secondary | ICD-10-CM | POA: Diagnosis not present

## 2016-06-21 DIAGNOSIS — Z818 Family history of other mental and behavioral disorders: Secondary | ICD-10-CM | POA: Insufficient documentation

## 2016-06-21 DIAGNOSIS — O0993 Supervision of high risk pregnancy, unspecified, third trimester: Secondary | ICD-10-CM | POA: Diagnosis not present

## 2016-06-21 DIAGNOSIS — Z141 Cystic fibrosis carrier: Secondary | ICD-10-CM | POA: Diagnosis not present

## 2016-06-21 DIAGNOSIS — B9689 Other specified bacterial agents as the cause of diseases classified elsewhere: Secondary | ICD-10-CM

## 2016-06-21 DIAGNOSIS — Z880 Allergy status to penicillin: Secondary | ICD-10-CM | POA: Insufficient documentation

## 2016-06-21 DIAGNOSIS — N760 Acute vaginitis: Secondary | ICD-10-CM | POA: Diagnosis not present

## 2016-06-21 DIAGNOSIS — J45909 Unspecified asthma, uncomplicated: Secondary | ICD-10-CM | POA: Insufficient documentation

## 2016-06-21 DIAGNOSIS — O09899 Supervision of other high risk pregnancies, unspecified trimester: Secondary | ICD-10-CM

## 2016-06-21 DIAGNOSIS — O43123 Velamentous insertion of umbilical cord, third trimester: Secondary | ICD-10-CM

## 2016-06-21 DIAGNOSIS — Z3A38 38 weeks gestation of pregnancy: Secondary | ICD-10-CM | POA: Diagnosis not present

## 2016-06-21 DIAGNOSIS — N898 Other specified noninflammatory disorders of vagina: Secondary | ICD-10-CM | POA: Diagnosis present

## 2016-06-21 LAB — WET PREP, GENITAL
Sperm: NONE SEEN
Trich, Wet Prep: NONE SEEN
Yeast Wet Prep HPF POC: NONE SEEN

## 2016-06-21 MED ORDER — METRONIDAZOLE 500 MG PO TABS: 500.0000 mg | ORAL_TABLET | Freq: Three times a day (TID) | ORAL | 0 refills | Status: AC

## 2016-06-21 MED ORDER — CYCLOBENZAPRINE HCL 10 MG PO TABS
10.0000 mg | ORAL_TABLET | Freq: Once | ORAL | Status: AC
  Administered 2016-06-21: 10 mg via ORAL
  Filled 2016-06-21: qty 1

## 2016-06-21 MED ORDER — CYCLOBENZAPRINE HCL 10 MG PO TABS: 10.0000 mg | ORAL_TABLET | Freq: Three times a day (TID) | ORAL | 1 refills | Status: DC | PRN

## 2016-06-21 NOTE — MAU Note (Signed)
I have communicated with Barnetta Chapel, CNM and reviewed vital signs:  Vitals:   06/21/16 2032 06/21/16 2117  BP:  110/73  Pulse: (!) 114 95  Resp:  17  Temp:      Vaginal exam:  Dilation: 1 Effacement (%): 50 Station: Ballotable Presentation: Vertex Exam by:: Lattie Haw, CNM,   Also reviewed contraction pattern and that non-stress test is reactive.  It has been documented that patient is contracting every irregularly with  No indication of active labor.  Patient denies sudden gush of fluid, but has been leaking "on & off" since 1400 today. Fern negative, sterile speculum exam negative for SROM via provider. Based on this report provider has given order for discharge.  A discharge order and diagnosis entered by a provider.   Labor discharge instructions reviewed with patient.

## 2016-06-21 NOTE — MAU Note (Signed)
VS WNL, EFM applied - FHR 140s, Toco applied - abd. Soft. Pt. c/o some vaginal leaking since 1400 today. Vaginal swab completed for fern testing.  Cramping started around 0900 this morning, pt. Here due to increased lower back and pelvic pain. Denies vaginal bleeding or sudden gush of fluid.  Last sexual encounter was "months ago".

## 2016-06-21 NOTE — Discharge Instructions (Signed)

## 2016-06-21 NOTE — MAU Note (Signed)
Pt c/o irregular contractions that started this morning. Has a lot of back pain and pelvic pressure. Pt denies vag bleeding. States she "leaked something" earlier this afternoon, but has not had any more leaking since that time. +FM

## 2016-06-21 NOTE — MAU Provider Note (Signed)
History    PAtient Stephanie Yu is a 26 year old G3P0020  at [redacted]w[redacted]d here with complaints of leaking of fluid since 10 am this morning. She has not had to change her pantyliner. She denies bleeding or decreased fetal movements.  CSN: [id number]  Arrival date and time: 06/21/16 2013   First Provider Initiated Contact with Patient 06/21/16 2120      Chief Complaint  Patient presents with  . Labor Eval   Vaginal Discharge  The patient's primary symptoms include vaginal discharge. The patient's pertinent negatives include no genital itching, genital lesions, genital odor, genital rash or pelvic pain. This is a new problem. The current episode started today. The problem occurs intermittently. The problem has been unchanged. Associated symptoms include abdominal pain and back pain. Pertinent negatives include no constipation, dysuria, flank pain, urgency or vomiting. The vaginal discharge was clear. There has been no bleeding. Nothing aggravates the symptoms. She has tried nothing for the symptoms.  Back Pain  This is a new problem. The current episode started today. The problem occurs constantly. The problem is unchanged. The pain is present in the lumbar spine. The pain is at a severity of 7/10. Associated symptoms include abdominal pain. Pertinent negatives include no dysuria or pelvic pain. She has tried nothing for the symptoms.    OB History    Gravida Para Term Preterm AB Living   3 0 0 0 2     SAB TAB Ectopic Multiple Live Births   1 1 0          Past Medical History:  Diagnosis Date  . Allergic rhinitis   . Anxiety   . Asthma   . Depression   . Miscarriage   . Seizures ([place])     Past Surgical History:  Procedure Laterality Date  . DILATION AND CURETTAGE OF UTERUS    . DILATION AND EVACUATION N/A 09/08/2015   Procedure: DILATATION AND EVACUATION;  Surgeon: Aletha Halim, MD;  Location: [facility];  Service: Gynecology;  Laterality: N/A;    Family History  Problem Relation  Age of Onset  . Hypertension Mother   . Heart disease Mother   . Diabetes Mother   . Hypertension Father   . Gout Father   . Anxiety disorder Father   . Lupus Maternal Grandmother   . Gout Paternal Grandmother   . Seizures Sister     Social History  Substance Use Topics  . Smoking status: Former Smoker    Packs/day: 0.25    Years: 10.00    Types: Cigarettes  . Smokeless tobacco: Never Used     Comment: Quit 10/11/15  . Alcohol use No     Comment: Quit 10/11/15    Allergies:  Allergies  Allergen Reactions  . Amoxicillin     rash  . Augmentin [Amoxicillin-Pot Clavulanate]   . Penicillins     Has patient had a PCN reaction causing immediate rash, facial/tongue/throat swelling, SOB or lightheadedness with hypotension: No Has patient had a PCN reaction causing severe rash involving mucus membranes or skin necrosis: yes Has patient had a PCN reaction that required hospitalization No Has patient had a PCN reaction occurring within the last 10 years: No If all of the above answers are "NO", then may proceed with Cephalosporin use.   . Vicodin [Hydrocodone-Acetaminophen] Itching    Nausea and vomiting    Prescriptions Prior to Admission  Medication Sig Dispense Refill Last Dose  . ALBUTEROL IN Inhale into the lungs.  Taking  . ALPRAZolam (XANAX) 1 MG tablet Take 1 mg by mouth at bedtime as needed for anxiety.   Not Taking  . omeprazole (PRILOSEC) 20 MG capsule Take 1 capsule (20 mg total) by mouth 2 (two) times daily before a meal. 60 capsule 5 Taking  . Prenatal Vit-Fe Fumarate-FA (PREPLUS) 27-1 MG TABS Take 1 tablet by mouth daily. 30 tablet 13 Taking    Review of Systems  Respiratory: Negative.   Cardiovascular: Negative.   Gastrointestinal: Positive for abdominal pain. Negative for constipation and vomiting.  Genitourinary: Positive for vaginal discharge. Negative for dysuria, flank pain, pelvic pain and urgency.  Musculoskeletal: Positive for back pain.       Low  back pain  Neurological: Negative.    Physical Exam   Blood pressure 112/80, pulse (!) 114, temperature 97.6 F (36.4 C), temperature source Oral, resp. rate 20, height 5\' 3"  (1.6 m), weight 85.7 kg (189 lb), last menstrual period 06/27/2015, SpO2 100 %, unknown if currently breastfeeding.  Physical Exam  Constitutional: She is oriented to person, place, and time. She appears well-developed and well-nourished.  HENT:  Head: Normocephalic.  Respiratory: Effort normal.  GI: Soft. She exhibits no distension and no mass. There is no tenderness. There is no rebound and no guarding.  Genitourinary: Vagina normal.  Genitourinary Comments: NEFG;; no lesions on vaginal walls. Cervix is pink with no lesions. No blood but Mucousy discharge present in the vagina, no CMT, no suprapubic tenderness, no adnexal tenderness. No pooling.  Musculoskeletal: Normal range of motion.  No CVAT tenderness  Neurological: She is alert and oriented to person, place, and time. She has normal reflexes.  Skin: Skin is warm and dry.  Psychiatric: She has a normal mood and affect.    MAU Course  Procedures  MDM -NST: 135 bpm, moderate variablity, present accels, no decels, occasional contractions and uterine irratbility -wet prep: positive for clue -sterile ferning: negative  -flexeril for back painl pain now reduced from a 7 to a 5 -GC CT pending Assessment and Plan   1. Bacterial vaginosis   2. Velamentous insertion of umbilical cord in third trimester   3. Cystic fibrosis carrier, antepartum   4. Supervision of high risk pregnancy, antepartum    2. Patient stable for discharge; reviewed when to return to the MAU (bleeding, leaking of fluid, decreased fetal movements). 3. RX for flagyl and flexeril sent to pharmacy on file   3. Patient to keep her prenatal appointment tomorrow at 9:15 am.   Mervyn Skeeters [facility] 06/21/2016, 10:08 PM

## 2016-06-22 ENCOUNTER — Ambulatory Visit (INDEPENDENT_AMBULATORY_CARE_PROVIDER_SITE_OTHER): Payer: Medicaid Other | Admitting: Obstetrics and Gynecology

## 2016-06-22 VITALS — BP 117/79 | HR 102 | Wt 188.0 lb

## 2016-06-22 DIAGNOSIS — O0993 Supervision of high risk pregnancy, unspecified, third trimester: Secondary | ICD-10-CM

## 2016-06-22 DIAGNOSIS — Z141 Cystic fibrosis carrier: Secondary | ICD-10-CM

## 2016-06-22 DIAGNOSIS — O99353 Diseases of the nervous system complicating pregnancy, third trimester: Secondary | ICD-10-CM

## 2016-06-22 DIAGNOSIS — O0990 Supervision of high risk pregnancy, unspecified, unspecified trimester: Secondary | ICD-10-CM

## 2016-06-22 DIAGNOSIS — O09899 Supervision of other high risk pregnancies, unspecified trimester: Secondary | ICD-10-CM

## 2016-06-22 DIAGNOSIS — G40909 Epilepsy, unspecified, not intractable, without status epilepticus: Secondary | ICD-10-CM

## 2016-06-22 DIAGNOSIS — O43123 Velamentous insertion of umbilical cord, third trimester: Secondary | ICD-10-CM

## 2016-06-22 LAB — GC/CHLAMYDIA PROBE AMP (~~LOC~~) NOT AT ARMC
Chlamydia: NEGATIVE
NEISSERIA GONORRHEA: NEGATIVE

## 2016-06-22 NOTE — Progress Notes (Signed)
She was checked at the [facility] yesterday, she is 1 cm dilated.

## 2016-06-22 NOTE — Progress Notes (Signed)
Subjective:  Stephanie Yu is a 26 y.o. G3P0020 at [redacted]w[redacted]d being seen today for ongoing prenatal care.  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  Patient reports no complaints.  Contractions: Irritability. Vag. Bleeding: None.  Movement: Present. Denies leaking of fluid.   The following portions of the patient's history were reviewed and updated as appropriate: allergies, current medications, past family history, past medical history, past social history, past surgical history and problem list. Problem list updated.  Objective:   Vitals:   06/22/16 0912  BP: 117/79  Pulse: (!) 102  Weight: 85.3 kg (188 lb)    Fetal Status: Fetal Heart Rate (bpm): 142   Movement: Present     General:  Alert, oriented and cooperative. Patient is in no acute distress.  Skin: Skin is warm and dry. No rash noted.   Cardiovascular: Normal heart rate noted  Respiratory: Normal respiratory effort, no problems with respiration noted  Abdomen: Soft, gravid, appropriate for gestational age. Pain/Pressure: Present     Pelvic:  Cervical exam deferred        Extremities: Normal range of motion.  Edema: Trace  Mental Status: Normal mood and affect. Normal behavior. Normal judgment and thought content.   Urinalysis:      Assessment and Plan:  Pregnancy: G3P0020 at [redacted]w[redacted]d  1. Supervision of high risk pregnancy, antepartum Labor Precautions  2. Velamentous insertion of umbilical cord in third trimester Stable no further w/u per MFM  3. Cystic fibrosis carrier, antepartum FOB declined testing  4. Seizure disorder ([place]) Stable no Sz or meds x 1 yr  Term labor symptoms and  general obstetric precautions including but not limited to vaginal bleeding, contractions, leaking of fluid and fetal movement were reviewed in detail with the patient. Please refer to After Visit Summary for other counseling recommendations.  Return in about 1 week (around 06/29/2016) for OB visit.   Chancy Milroy, MD

## 2016-06-22 NOTE — Patient Instructions (Signed)

## 2016-06-29 ENCOUNTER — Ambulatory Visit (INDEPENDENT_AMBULATORY_CARE_PROVIDER_SITE_OTHER): Payer: Medicaid Other | Admitting: Obstetrics and Gynecology

## 2016-06-29 VITALS — BP 136/78 | HR 108 | Wt 191.2 lb

## 2016-06-29 DIAGNOSIS — O99353 Diseases of the nervous system complicating pregnancy, third trimester: Secondary | ICD-10-CM

## 2016-06-29 DIAGNOSIS — O0993 Supervision of high risk pregnancy, unspecified, third trimester: Secondary | ICD-10-CM

## 2016-06-29 DIAGNOSIS — O0990 Supervision of high risk pregnancy, unspecified, unspecified trimester: Secondary | ICD-10-CM

## 2016-06-29 DIAGNOSIS — O09899 Supervision of other high risk pregnancies, unspecified trimester: Secondary | ICD-10-CM

## 2016-06-29 DIAGNOSIS — Z141 Cystic fibrosis carrier: Secondary | ICD-10-CM

## 2016-06-29 NOTE — Progress Notes (Signed)
Patient states she has not had any symptoms of labor.

## 2016-06-29 NOTE — Progress Notes (Signed)
   PRENATAL VISIT NOTE  Subjective:  Stephanie Yu is a 26 y.o. G3P0020 at [redacted]w[redacted]d being seen today for ongoing prenatal care.  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  Patient reports no complaints.  Contractions: Not present. Vag. Bleeding: None.  Movement: Present. Denies leaking of fluid.   The following portions of the patient's history were reviewed and updated as appropriate: allergies, current medications, past family history, past medical history, past social history, past surgical history and problem list. Problem list updated.  Objective:   Vitals:   06/29/16 1042  BP: 136/78  Pulse: (!) 108  Weight: 191 lb 3.2 oz (86.7 kg)    Fetal Status: Fetal Heart Rate (bpm): 147 Fundal Height: 39 cm Movement: Present     General:  Alert, oriented and cooperative. Patient is in no acute distress.  Skin: Skin is warm and dry. No rash noted.   Cardiovascular: Normal heart rate noted  Respiratory: Normal respiratory effort, no problems with respiration noted  Abdomen: Soft, gravid, appropriate for gestational age. Pain/Pressure: Present     Pelvic:  Cervical exam deferred        Extremities: Normal range of motion.  Edema: Trace  Mental Status: Normal mood and affect. Normal behavior. Normal judgment and thought content.   Assessment and Plan:  Pregnancy: G3P0020 at [redacted]w[redacted]d  1. Supervision of high risk pregnancy, antepartum Patient is doing well without complaints Plan is for IOL at 41 weeks  2. Cystic fibrosis carrier, antepartum Declined further testing  Term labor symptoms and general obstetric precautions including but not limited to vaginal  bleeding, contractions, leaking of fluid and fetal movement were reviewed in detail with the patient. Please refer to After Visit Summary for other counseling recommendations.  Return in about 1 week (around 07/06/2016) for ROB, NST.   Darci Lykins, Vickii Chafe, MD

## 2016-07-02 ENCOUNTER — Telehealth (HOSPITAL_COMMUNITY): Payer: Self-pay | Admitting: *Deleted

## 2016-07-02 ENCOUNTER — Other Ambulatory Visit: Payer: Self-pay | Admitting: Advanced Practice Midwife

## 2016-07-02 NOTE — Telephone Encounter (Signed)
Preadmission screen  

## 2016-07-08 ENCOUNTER — Ambulatory Visit (INDEPENDENT_AMBULATORY_CARE_PROVIDER_SITE_OTHER): Payer: Medicaid Other | Admitting: Obstetrics and Gynecology

## 2016-07-08 ENCOUNTER — Inpatient Hospital Stay (HOSPITAL_COMMUNITY): Payer: Medicaid Other | Admitting: Anesthesiology

## 2016-07-08 ENCOUNTER — Inpatient Hospital Stay (HOSPITAL_COMMUNITY)
Admission: AD | Admit: 2016-07-08 | Discharge: 2016-07-10 | DRG: 775 | Disposition: A | Discharge status: Home / Self Care | Payer: Medicaid Other | Source: Ambulatory Visit | Attending: Obstetrics & Gynecology | Admitting: Obstetrics & Gynecology

## 2016-07-08 ENCOUNTER — Encounter (HOSPITAL_COMMUNITY): Payer: Self-pay | Admitting: Obstetrics

## 2016-07-08 VITALS — BP 121/77 | HR 104 | Wt 187.8 lb

## 2016-07-08 DIAGNOSIS — O0990 Supervision of high risk pregnancy, unspecified, unspecified trimester: Secondary | ICD-10-CM

## 2016-07-08 DIAGNOSIS — O09899 Supervision of other high risk pregnancies, unspecified trimester: Secondary | ICD-10-CM

## 2016-07-08 DIAGNOSIS — Z141 Cystic fibrosis carrier: Secondary | ICD-10-CM

## 2016-07-08 DIAGNOSIS — Z3A41 41 weeks gestation of pregnancy: Secondary | ICD-10-CM

## 2016-07-08 DIAGNOSIS — O43123 Velamentous insertion of umbilical cord, third trimester: Secondary | ICD-10-CM | POA: Diagnosis present

## 2016-07-08 DIAGNOSIS — Z880 Allergy status to penicillin: Secondary | ICD-10-CM

## 2016-07-08 DIAGNOSIS — O480 Post-term pregnancy: Principal | ICD-10-CM | POA: Diagnosis present

## 2016-07-08 DIAGNOSIS — O99354 Diseases of the nervous system complicating childbirth: Secondary | ICD-10-CM | POA: Diagnosis present

## 2016-07-08 DIAGNOSIS — O99353 Diseases of the nervous system complicating pregnancy, third trimester: Secondary | ICD-10-CM

## 2016-07-08 DIAGNOSIS — O99344 Other mental disorders complicating childbirth: Secondary | ICD-10-CM | POA: Diagnosis present

## 2016-07-08 DIAGNOSIS — J4520 Mild intermittent asthma, uncomplicated: Secondary | ICD-10-CM | POA: Diagnosis present

## 2016-07-08 DIAGNOSIS — F410 Panic disorder [episodic paroxysmal anxiety] without agoraphobia: Secondary | ICD-10-CM | POA: Diagnosis present

## 2016-07-08 DIAGNOSIS — G40209 Localization-related (focal) (partial) symptomatic epilepsy and epileptic syndromes with complex partial seizures, not intractable, without status epilepticus: Secondary | ICD-10-CM

## 2016-07-08 DIAGNOSIS — O0993 Supervision of high risk pregnancy, unspecified, third trimester: Secondary | ICD-10-CM

## 2016-07-08 DIAGNOSIS — Z3A40 40 weeks gestation of pregnancy: Secondary | ICD-10-CM

## 2016-07-08 DIAGNOSIS — O9952 Diseases of the respiratory system complicating childbirth: Secondary | ICD-10-CM | POA: Diagnosis present

## 2016-07-08 DIAGNOSIS — G40909 Epilepsy, unspecified, not intractable, without status epilepticus: Secondary | ICD-10-CM | POA: Diagnosis present

## 2016-07-08 LAB — CBC
HEMATOCRIT: 36.9 % (ref 36.0–46.0)
HEMOGLOBIN: 12.7 g/dL (ref 12.0–15.0)
MCH: 31 pg (ref 26.0–34.0)
MCHC: 34.4 g/dL (ref 30.0–36.0)
MCV: 90 fL (ref 78.0–100.0)
Platelets: 281 10*3/uL (ref 150–400)
RBC: 4.1 MIL/uL (ref 3.87–5.11)
RDW: 13.5 % (ref 11.5–15.5)
WBC: 10.9 10*3/uL — ABNORMAL HIGH (ref 4.0–10.5)

## 2016-07-08 LAB — TYPE AND SCREEN
ABO/RH(D): A POS
ANTIBODY SCREEN: NEGATIVE

## 2016-07-08 LAB — ABO/RH: ABO/RH(D): A POS

## 2016-07-08 MED ORDER — SOD CITRATE-CITRIC ACID 500-334 MG/5ML PO SOLN: 30.0000 mL | ORAL | Status: DC | PRN

## 2016-07-08 MED ORDER — FENTANYL CITRATE (PF) 100 MCG/2ML IJ SOLN: 100.0000 ug | INTRAMUSCULAR | Status: DC | PRN

## 2016-07-08 MED ORDER — TERBUTALINE SULFATE 1 MG/ML IJ SOLN
0.2500 mg | Freq: Once | INTRAMUSCULAR | Status: DC | PRN
  Filled 2016-07-08: qty 1

## 2016-07-08 MED ORDER — OXYTOCIN 40 UNITS IN LACTATED RINGERS INFUSION - SIMPLE MED
1.0000 m[IU]/min | INTRAVENOUS | Status: DC
  Administered 2016-07-08: 2 m[IU]/min via INTRAVENOUS
  Filled 2016-07-08: qty 1000

## 2016-07-08 MED ORDER — ALBUTEROL SULFATE (2.5 MG/3ML) 0.083% IN NEBU: 3.0000 mL | INHALATION_SOLUTION | RESPIRATORY_TRACT | Status: DC | PRN

## 2016-07-08 MED ORDER — OXYTOCIN BOLUS FROM INFUSION
500.0000 mL | Freq: Once | INTRAVENOUS | Status: AC
  Administered 2016-07-08: 500 mL via INTRAVENOUS

## 2016-07-08 MED ORDER — FENTANYL 2.5 MCG/ML BUPIVACAINE 1/10 % EPIDURAL INFUSION (WH - ANES)
INTRAMUSCULAR | Status: AC
  Filled 2016-07-08: qty 100

## 2016-07-08 MED ORDER — LIDOCAINE HCL (PF) 1 % IJ SOLN
30.0000 mL | INTRAMUSCULAR | Status: DC | PRN
  Administered 2016-07-08: 30 mL via SUBCUTANEOUS
  Filled 2016-07-08: qty 30

## 2016-07-08 MED ORDER — LACTATED RINGERS IV SOLN
INTRAVENOUS | Status: DC
  Administered 2016-07-08: 10:00:00 via INTRAVENOUS

## 2016-07-08 MED ORDER — FENTANYL 2.5 MCG/ML BUPIVACAINE 1/10 % EPIDURAL INFUSION (WH - ANES)
14.0000 mL/h | INTRAMUSCULAR | Status: DC | PRN
  Administered 2016-07-08: 14 mL/h via EPIDURAL

## 2016-07-08 MED ORDER — LACTATED RINGERS IV SOLN
500.0000 mL | Freq: Once | INTRAVENOUS | Status: AC
  Administered 2016-07-08: 500 mL via INTRAVENOUS

## 2016-07-08 MED ORDER — OXYTOCIN 40 UNITS IN LACTATED RINGERS INFUSION - SIMPLE MED: 2.5000 [IU]/h | INTRAVENOUS | Status: DC

## 2016-07-08 MED ORDER — DIPHENHYDRAMINE HCL 50 MG/ML IJ SOLN: 12.5000 mg | INTRAMUSCULAR | Status: DC | PRN

## 2016-07-08 MED ORDER — LACTATED RINGERS IV SOLN: 500.0000 mL | INTRAVENOUS | Status: DC | PRN

## 2016-07-08 MED ORDER — ONDANSETRON HCL 4 MG/2ML IJ SOLN: 4.0000 mg | Freq: Four times a day (QID) | INTRAMUSCULAR | Status: DC | PRN

## 2016-07-08 MED ORDER — PHENYLEPHRINE 40 MCG/ML (10ML) SYRINGE FOR IV PUSH (FOR BLOOD PRESSURE SUPPORT)
80.0000 ug | PREFILLED_SYRINGE | INTRAVENOUS | Status: DC | PRN
  Filled 2016-07-08: qty 5

## 2016-07-08 MED ORDER — EPHEDRINE 5 MG/ML INJ
10.0000 mg | INTRAVENOUS | Status: DC | PRN
  Filled 2016-07-08: qty 2

## 2016-07-08 MED ORDER — EPHEDRINE 5 MG/ML INJ
10.0000 mg | INTRAVENOUS | Status: DC | PRN
Start: 2016-07-08 — End: 2016-07-10
  Filled 2016-07-08: qty 2

## 2016-07-08 MED ORDER — LIDOCAINE HCL (PF) 1 % IJ SOLN
INTRAMUSCULAR | Status: DC | PRN
  Administered 2016-07-08: 13 mL via EPIDURAL

## 2016-07-08 MED ORDER — PHENYLEPHRINE 40 MCG/ML (10ML) SYRINGE FOR IV PUSH (FOR BLOOD PRESSURE SUPPORT)
PREFILLED_SYRINGE | INTRAVENOUS | Status: AC
  Filled 2016-07-08: qty 20

## 2016-07-08 MED ORDER — MISOPROSTOL 25 MCG QUARTER TABLET
25.0000 ug | ORAL_TABLET | ORAL | Status: DC | PRN
  Administered 2016-07-08: 25 ug via VAGINAL
  Filled 2016-07-08 (×2): qty 1

## 2016-07-08 NOTE — Progress Notes (Signed)
   PRENATAL VISIT NOTE  Subjective:  Stephanie Yu is a 26 y.o. G3P0020 at [redacted]w[redacted]d being seen today for ongoing prenatal care.  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  Patient reports no complaints.  Contractions: Not present. Vag. Bleeding: None.  Movement: Present. Denies leaking of fluid.   The following portions of the patient's history were reviewed and updated as appropriate: allergies, current medications, past family history, past medical history, past social history, past surgical history and problem list. Problem list updated.  Objective:   Vitals:   07/08/16 0825  BP: 121/77  Pulse: (!) 104  Weight: 187 lb 12.8 oz (85.2 kg)    Fetal Status: Fetal Heart Rate (bpm): NST   Movement: Present     General:  Alert, oriented and cooperative. Patient is in no acute distress.  Skin: Skin is warm and dry. No rash noted.   Cardiovascular: Normal heart rate noted  Respiratory: Normal respiratory effort, no problems with respiration noted  Abdomen: Soft, gravid, appropriate for gestational age. Pain/Pressure: Absent     Pelvic:  Cervical exam deferred        Extremities: Normal range of motion.  Edema: None  Mental Status: Normal mood and affect. Normal behavior. Normal judgment and thought content.   Assessment and Plan:  Pregnancy: [id number] at [redacted]w[redacted]d  1. Supervision of high risk pregnancy, antepartum Patient is doing well without complaints Post date NST reviewed and non reactive with baseline 135, mod variability, no accels, no decels Patient sent to [facility] for IOL. She was scheduled for IOL on 5/18  2. Cystic fibrosis carrier, antepartum Declined  FOB testing  3. Partial symptomatic epilepsy with complex partial seizures, not intractable, without status epilepticus ([place]) No seizure for over a year. Not on medication  Term labor symptoms and general obstetric precautions including but not limited to vaginal bleeding, contractions, leaking of fluid and fetal movement were reviewed in detail with the patient. Please refer to After Visit Summary for other counseling recommendations.  Return in about 6 weeks (around 08/19/2016) for pp visit.   Mora Bellman, MD

## 2016-07-08 NOTE — H&P (Signed)
LABOR AND DELIVERY ADMISSION HISTORY AND PHYSICAL NOTE  Stephanie Yu is a 26 y.o. female G30P0020 with IUP at [redacted]w[redacted]d by 6 wk [place] presenting for IOL 2/2 post dates and non-reactive NST in office today.   She reports positive fetal movement. She denies leakage of fluid or vaginal bleeding. States she has asthma, and needed to use her inhaler this AM.   Past Medical History: Past Medical History:  Diagnosis Date  . Allergic rhinitis   . Anxiety   . Asthma   . Depression   . Miscarriage   . Seizures ([place])     Past Surgical History: Past Surgical History:  Procedure Laterality Date  . DILATION AND CURETTAGE OF UTERUS    . DILATION AND EVACUATION N/A 09/08/2015   Procedure: DILATATION AND EVACUATION;  Surgeon: Aletha Halim, MD;  Location: [facility];  Service: Gynecology;  Laterality: N/A;    Obstetrical History: OB History    Gravida Para Term Preterm AB Living   3 0 0 0 2     SAB TAB Ectopic Multiple Live Births   1 1 0          Social History: Social History   Social History  . Marital status: Single    Spouse name: N/A  . Number of children: 0  . Years of education: In college now   Occupational History  . Unemployed    Social History Main Topics  . Smoking status: Former Smoker    Packs/day: 0.25    Years: 10.00    Types: Cigarettes  . Smokeless tobacco: Never Used     Comment: Quit 10/11/15  . Alcohol use No     Comment: Quit 10/11/15  . Drug use: No  . Sexual activity: Yes    Birth control/ protection: None   Other Topics Concern  . None   Social History Narrative   Lives at home alone.   Right-handed.   No caffeine use.        Family History: Family History  Problem Relation Age of Onset  . Hypertension Mother   . Heart disease Mother   . Diabetes Mother   . Hypertension Father   . Gout Father   . Anxiety disorder Father   . Lupus Maternal Grandmother   . Gout Paternal Grandmother   . Seizures Sister     Allergies: Allergies  Allergen  Reactions  . Amoxicillin     rash  . Augmentin [Amoxicillin-Pot Clavulanate]   . Penicillins     Has patient had a PCN reaction causing immediate rash, facial/tongue/throat swelling, SOB or lightheadedness with hypotension: No Has patient had a PCN reaction causing severe rash involving mucus membranes or skin necrosis: yes Has patient had a PCN reaction that required hospitalization No Has patient had a PCN reaction occurring within the last 10 years: No If all of the above answers are "NO", then may proceed with Cephalosporin use.   . Vicodin [Hydrocodone-Acetaminophen] Itching    Nausea and vomiting    Prescriptions Prior to Admission  Medication Sig Dispense Refill Last Dose  . ALBUTEROL IN Inhale into the lungs.   07/08/2016 at Unknown time  . ALPRAZolam (XANAX) 1 MG tablet Take 0.5 mg by mouth at bedtime as needed for anxiety.    Past Month at Unknown time  . cyclobenzaprine (FLEXERIL) 10 MG tablet Take 1 tablet (10 mg total) by mouth every 8 (eight) hours as needed for muscle spasms. 10 tablet 1 Past Week at Unknown time  .  omeprazole (PRILOSEC) 20 MG capsule Take 1 capsule (20 mg total) by mouth 2 (two) times daily before a meal. 60 capsule 5 Taking  . Prenatal Vit-Fe Fumarate-FA (PREPLUS) 27-1 MG TABS Take 1 tablet by mouth daily. 30 tablet 13 Taking     Review of Systems   All systems reviewed and negative except as stated in HPI  Physical Exam Blood pressure 129/80, pulse (!) 118, temperature 98.8 F (37.1 C), temperature source Oral, resp. rate 18, height 5\' 3"  (1.6 m), weight 187 lb (84.8 kg), last menstrual period 06/27/2015, SpO2 99 %, unknown if currently breastfeeding. General appearance: alert, cooperative, appears stated age and no distress Lungs: clear to auscultation bilaterally Heart: regular rate and rhythm Abdomen: soft, non-tender; bowel sounds normal Extremities: No calf swelling or tenderness Presentation: cephalic Fetal monitoring: 140 bpm, mod var,  +accels, no decels Uterine activity: Infrequent Dilation: 2 Effacement (%): 60 Station: -3 Exam by:: Dr Vanetta Shawl   Prenatal labs: ABO, Rh: --/--/A POS (05/17 1025) Antibody: NEG (05/17 1025) Rubella: !Error! IMMUNE RPR: Non Reactive (02/21 1030)  HBsAg: Negative (10/23 0000)  HIV: Non Reactive (02/21 1030)  GBS:   NEG 2 hr Glucola: Normal, 81/116/45 Genetic screening:  Negative Anatomy US: Velamentous cord insertion, resolved previa.   Prenatal Transfer Tool  Maternal Diabetes: No Genetic Screening: Normal Maternal Ultrasounds/Referrals: Abnormal:  Findings:   Other:Velamentous cord insertion Fetal Ultrasounds or other Referrals:  None Maternal Substance Abuse:  No Significant Maternal Medications:  None Significant Maternal Lab Results: Lab values include: Group B Strep negative  Results for orders placed or performed during the hospital encounter of 07/08/16 (from the past 24 hour(s))  CBC   Collection Time: 07/08/16 10:25 AM  Result Value Ref Range   WBC 10.9 (H) 4.0 - 10.5 K/uL   RBC 4.10 3.87 - 5.11 MIL/uL   Hemoglobin 12.7 12.0 - 15.0 g/dL   HCT 36.9 36.0 - 46.0 %   MCV 90.0 78.0 - 100.0 fL   MCH 31.0 26.0 - 34.0 pg   MCHC 34.4 30.0 - 36.0 g/dL   RDW 13.5 11.5 - 15.5 %   Platelets 281 150 - 400 K/uL  Type and screen   Collection Time: 07/08/16 10:25 AM  Result Value Ref Range   ABO/RH(D) A POS    Antibody Screen NEG    Sample Expiration 07/11/2016     Patient Active Problem List   Diagnosis Date Noted  . Post term pregnancy at [redacted] weeks gestation 07/08/2016  . Velamentous insertion of umbilical cord in third trimester 02/06/2016  . Complex partial seizure ([place]) 12/29/2015  . 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 Seizure disorder ([place]) 12/08/2015  . Supervision of high risk pregnancy, antepartum 12/08/2015  . Allergic rhinitis 12/08/2015  . Asthma, mild intermittent 12/08/2015  . Panic anxiety syndrome 12/08/2015    Assessment: Stephanie Yu is a 25 y.o. G3P0020 at [redacted]w[redacted]d here for IOL 2/2 Post-dates and non-reactive NST.   #Labor:IOL with FB (placed with ease) and cytotec PV #Pain: Fentanyl prn; epidural requested if >3cm #FWB: Cat I #ID:  GBS Neg #MOF: Breast #MOC:Condoms, may do POPs #Circ:  N/A - girl  Katherine Basset, DO [place] [facility], [facility] 07/08/2016, 11:53 AM

## 2016-07-08 NOTE — Anesthesia Preprocedure Evaluation (Signed)
Anesthesia Evaluation  Patient identified by MRN, date of birth, ID band Patient awake    Reviewed: Allergy & Precautions, H&P , Patient's Chart, lab work & pertinent test results, reviewed documented beta blocker date and time   Airway Mallampati: II  TM Distance: >3 FB Neck ROM: full    Dental no notable dental hx.    Pulmonary asthma , Current Smoker, former smoker,    Pulmonary exam normal breath sounds clear to auscultation       Cardiovascular  Rhythm:regular Rate:Normal     Neuro/Psych    GI/Hepatic   Endo/Other    Renal/GU      Musculoskeletal   Abdominal   Peds  Hematology   Anesthesia Other Findings No wheezes  Reproductive/Obstetrics (+) Pregnancy                             Anesthesia Physical  Anesthesia Plan  ASA: II  Anesthesia Plan: Epidural   Post-op Pain Management:    Induction:   Airway Management Planned:   Additional Equipment:   Intra-op Plan:   Post-operative Plan:   Informed Consent: I have reviewed the patients History and Physical, chart, labs and discussed the procedure including the risks, benefits and alternatives for the proposed anesthesia with the patient or authorized representative who has indicated his/her understanding and acceptance.   Dental Advisory Given and Dental advisory given  Plan Discussed with: CRNA and Surgeon  Anesthesia Plan Comments: (Discussed GA with LMA, possible sore throat, potential need to switch to ETT, N/V, pulmonary aspiration. Questions answered. )        Anesthesia Quick Evaluation

## 2016-07-08 NOTE — Progress Notes (Signed)
Labor Progress Note  Shawneen Corrigan is a 26 y.o. G3P0020 at [redacted]w[redacted]d  admitted for induction of labor due to Post dates and nonreactive NST in office   S: Patient doing well, pain is well controlled.  Pain, PIH symptoms (HA, scotomata, RUQ pain)  O:  BP 119/79   Pulse 91   Temp 98.2 F (36.8 C) (Oral)   Resp 18   Ht 5\' 3"  (1.6 m)   Wt 187 lb (84.8 kg)   LMP 06/27/2015   SpO2 99%   BMI 33.13 kg/m   No intake/output data recorded.  FHT:  FHR: 140 bpm, variability: moderate,  accelerations:  Present,  decelerations:  Absent UC:   irregular, every 4 minutes SVE:   Dilation: 3.5 Effacement (%): 70, 80 Station: -2 Exam by:: Wess Botts RN  Pitocin @ 4 mu/min  Labs: Lab Results  Component Value Date   WBC 10.9 (H) 07/08/2016   HGB 12.7 07/08/2016   HCT 36.9 07/08/2016   MCV 90.0 07/08/2016   PLT 281 07/08/2016    Assessment / Plan: 26 y.o. G3P0020 [redacted]w[redacted]d in early/active labor Induction of labor due to postterm,  progressing well on pitocin  Labor: Progressing normally Fetal Wellbeing:  Category I Pain Control:  Epidural Anticipated MOD:  NSVD  Expectant management   @SIGN @

## 2016-07-08 NOTE — Anesthesia Pain Management Evaluation Note (Signed)
  CRNA Pain Management Visit Note  Patient: Stephanie Yu, 26 y.o., female  "Hello I am a member of the anesthesia team at [facility]. We have an anesthesia team available at all times to provide care throughout the hospital, including epidural management and anesthesia for C-section. I don't know your plan for the delivery whether it a natural birth, water birth, IV sedation, nitrous supplementation, doula or epidural, but we want to meet your pain goals."   1.Was your pain managed to your expectations on prior hospitalizations?   No prior hospitalizations  2.What is your expectation for pain management during this hospitalization?     Epidural, IV pain meds and Nitrous Oxide  3.How can we help you reach that goal? epidural  Record the patient's initial score and the patient's pain goal.   Pain: 3  Pain Goal: 7 [facility] wants you to be able to say your pain was always managed very well.  Sia Gabrielsen 07/08/2016

## 2016-07-08 NOTE — Progress Notes (Signed)
Labor Progress Note  Stephanie Yu is a 26 y.o. G3P0020 at [redacted]w[redacted]d  admitted for induction of labor due to Post dates and NST in office today.  S: patient endorsing increased pain and pressure  O:  BP 107/69   Pulse 89   Temp 98.2 F (36.8 C) (Oral)   Resp 18   Ht 5\' 3"  (1.6 m)   Wt 187 lb (84.8 kg)   LMP 06/27/2015   SpO2 99%   BMI 33.13 kg/m   No intake/output data recorded.  FHT:  FHR: 135 bpm, variability: moderate,  accelerations:  Present,  decelerations:  Absent UC:   Ever 1-2 minutes SVE:   Dilation: 5 Effacement (%): 70, 80 Station: -2 Exam by:: Dr Vanetta Shawl SROM/AROM: SROM at some point between 3:30  Pitocin @ 2 mu/min  Labs: Lab Results  Component Value Date   WBC 10.9 (H) 07/08/2016   HGB 12.7 07/08/2016   HCT 36.9 07/08/2016   MCV 90.0 07/08/2016   PLT 281 07/08/2016    Assessment / Plan: 26 y.o. G3P0020 [redacted]w[redacted]d  in early/active labor Induction of labor due to postterm,  progressing well on pitocin  Labor: Progressing on Pitocin, will continue to increase then AROM Fetal Wellbeing:  Category I Pain Control:  Nitrous Oxide, patient requesting epidural Anticipated MOD:  NSVD  Expectant management  Everrett Coombe, MD PGY-1 [facility]

## 2016-07-08 NOTE — Anesthesia Procedure Notes (Signed)
Epidural Patient location during procedure: OB Start time: 07/08/2016 7:16 PM End time: 07/08/2016 7:31 PM  Staffing Anesthesiologist: Candida Peeling RAY Performed: anesthesiologist   Preanesthetic Checklist Completed: patient identified, site marked, surgical consent, pre-op evaluation, timeout performed, IV checked, risks and benefits discussed and monitors and equipment checked  Epidural Patient position: sitting Prep: DuraPrep Patient monitoring: heart rate, cardiac monitor, continuous pulse ox and blood pressure Approach: midline Location: L2-L3 Injection technique: LOR saline  Needle:  Needle type: Tuohy  Needle gauge: 17 G Needle length: 9 cm Needle insertion depth: 5 cm Catheter type: closed end flexible Catheter size: 20 Guage Catheter at skin depth: 8 cm Test dose: negative  Assessment Events: blood not aspirated, injection not painful, no injection resistance, negative IV test and no paresthesia  Additional Notes Reason for block:procedure for pain

## 2016-07-09 ENCOUNTER — Inpatient Hospital Stay (HOSPITAL_COMMUNITY): Admission: RE | Admit: 2016-07-09 | Payer: Medicaid Other | Source: Ambulatory Visit

## 2016-07-09 LAB — RPR: RPR Ser Ql: NONREACTIVE

## 2016-07-09 MED ORDER — DIPHENHYDRAMINE HCL 25 MG PO CAPS: 25.0000 mg | ORAL_CAPSULE | Freq: Four times a day (QID) | ORAL | Status: DC | PRN

## 2016-07-09 MED ORDER — COCONUT OIL OIL: 1.0000 "application " | TOPICAL_OIL | Status: DC | PRN

## 2016-07-09 MED ORDER — TETANUS-DIPHTH-ACELL PERTUSSIS 5-2.5-18.5 LF-MCG/0.5 IM SUSP: 0.5000 mL | Freq: Once | INTRAMUSCULAR | Status: DC

## 2016-07-09 MED ORDER — OXYCODONE HCL 5 MG PO TABS: 10.0000 mg | ORAL_TABLET | ORAL | Status: DC | PRN

## 2016-07-09 MED ORDER — ACETAMINOPHEN 325 MG PO TABS: 650.0000 mg | ORAL_TABLET | ORAL | Status: DC | PRN

## 2016-07-09 MED ORDER — SENNOSIDES-DOCUSATE SODIUM 8.6-50 MG PO TABS
2.0000 | ORAL_TABLET | ORAL | Status: DC
  Administered 2016-07-09: 2 via ORAL
  Filled 2016-07-09: qty 2

## 2016-07-09 MED ORDER — PRENATAL MULTIVITAMIN CH
1.0000 | ORAL_TABLET | Freq: Every day | ORAL | Status: DC
  Administered 2016-07-09 – 2016-07-10 (×2): 1 via ORAL
  Filled 2016-07-09 (×2): qty 1

## 2016-07-09 MED ORDER — IBUPROFEN 600 MG PO TABS
600.0000 mg | ORAL_TABLET | Freq: Four times a day (QID) | ORAL | Status: DC
  Administered 2016-07-09 – 2016-07-10 (×6): 600 mg via ORAL
  Filled 2016-07-09 (×6): qty 1

## 2016-07-09 MED ORDER — WITCH HAZEL-GLYCERIN EX PADS: 1.0000 "application " | MEDICATED_PAD | CUTANEOUS | Status: DC | PRN

## 2016-07-09 MED ORDER — OXYCODONE HCL 5 MG PO TABS: 5.0000 mg | ORAL_TABLET | ORAL | Status: DC | PRN

## 2016-07-09 MED ORDER — ZOLPIDEM TARTRATE 5 MG PO TABS: 5.0000 mg | ORAL_TABLET | Freq: Every evening | ORAL | Status: DC | PRN

## 2016-07-09 MED ORDER — DIBUCAINE 1 % RE OINT: 1.0000 "application " | TOPICAL_OINTMENT | RECTAL | Status: DC | PRN

## 2016-07-09 MED ORDER — ONDANSETRON HCL 4 MG/2ML IJ SOLN: 4.0000 mg | INTRAMUSCULAR | Status: DC | PRN

## 2016-07-09 MED ORDER — BENZOCAINE-MENTHOL 20-0.5 % EX AERO
1.0000 "application " | INHALATION_SPRAY | CUTANEOUS | Status: DC | PRN
  Filled 2016-07-09: qty 56

## 2016-07-09 MED ORDER — SIMETHICONE 80 MG PO CHEW: 80.0000 mg | CHEWABLE_TABLET | ORAL | Status: DC | PRN

## 2016-07-09 MED ORDER — ONDANSETRON HCL 4 MG PO TABS: 4.0000 mg | ORAL_TABLET | ORAL | Status: DC | PRN

## 2016-07-09 NOTE — Progress Notes (Signed)
Post Partum Day #1 Subjective: no complaints, up ad lib, voiding, tolerating PO and reports normal lochia  Objective: Blood pressure 122/70, pulse 84, temperature 98.1 F (36.7 C), temperature source Oral, resp. rate 18, height 5\' 3"  (1.6 m), weight 84.8 kg (187 lb), last menstrual period 06/27/2015, SpO2 99 %, unknown if currently breastfeeding.  Physical Exam:  General: alert Lochia: appropriate Uterine Fundus: firm and NT at U DVT Evaluation: No evidence of DVT seen on physical exam.   Recent Labs  07/08/16 1025  HGB 12.7  HCT 36.9    Assessment/Plan: Plan for discharge tomorrow   LOS: 1 day   Stephanie Yu 07/09/2016, 7:14 AM

## 2016-07-09 NOTE — Anesthesia Postprocedure Evaluation (Signed)
Anesthesia Post Note  Patient: Stephanie Yu  Procedure(s) Performed: * No procedures listed *  Patient location during evaluation: Mother Baby Anesthesia Type: Epidural Level of consciousness: awake Pain management: pain level controlled Vital Signs Assessment: post-procedure vital signs reviewed and stable Respiratory status: spontaneous breathing Cardiovascular status: stable Postop Assessment: no headache, no backache, epidural receding and patient able to bend at knees Anesthetic complications: no        Last Vitals:  Vitals:   07/08/16 2357 07/09/16 0453  BP: 138/71 122/70  Pulse: 99 84  Resp: 18 18  Temp: 36.8 C 36.7 C    Last Pain:  Vitals:   07/09/16 0453  TempSrc: Oral  PainSc: 3    Pain Goal: Patients Stated Pain Goal: 2 (07/08/16 1931)               Everette Rank

## 2016-07-09 NOTE — Lactation Note (Signed)
This note was copied from a baby's chart. Lactation Consultation Note  Patient Name: Stephanie Yu LMRAJ'H Date: 07/09/2016 Reason for consult: Follow-up assessment;Difficult latch  Baby 73 hrs old, first baby.  Mom has been having trouble latching onto right side.  Baby alert and rooting to feed.  Placed baby in laid back position and tried to help baby latch.  Baby opened widely and quickly slipped onto nipple.  Nipple erect, and semi-large in diameter.  [place] compressible, but nipple tends to invert when breast sandwiched.  Baby has a recessed chin.   Switched to left breast, and in laid back position, baby latched deeply onto breast.  Baby maintained a rhythmic sucking pattern for 15 minutes.   Set up DEBP and instructed Mom on pumping after baby feeds at the breast, due to baby not latching on right breast.  Mom to call for assistance is colostrum expressed, as we will assist her feeding this EBM to baby.  Mom also encouraged to use breast massage and hand expression along with DEBP. Encouraged cue based feedings, STS.   Lactation to follow-up prn, and in am.    Consult Status Consult Status: Follow-up Date: 07/10/16 Follow-up type: In-patient    Broadus John 07/09/2016, 3:01 PM

## 2016-07-09 NOTE — Progress Notes (Signed)
[name] was referred for history of depression/anxiety. * Referral screened out by Clinical Social Worker because none of the following criteria appear to apply: ~ History of anxiety/depression during this pregnancy, or of post-partum depression. ~ Diagnosis of anxiety and/or depression within last 3 years OR * [name]'s symptoms currently being treated with medication and/or therapy. Please contact the Clinical Social Worker if needs arise, or if [name] requests.  [name] has Rx for Xanax.

## 2016-07-09 NOTE — Lactation Note (Addendum)
This note was copied from a baby's chart. Lactation Consultation Note New mom has large everted nipples. Lt. Nipple very compressible, Rt. Nipple everted, needs more stimulation not to compress.  Mom has large overbite, baby does as well. Discussed w/mom getting deep latches. Baby sleeping, mom was sleeping, woke when [place] entered rm. Mom stated she had been doing STS.  Mom encouraged to feed baby 8-12 times/24 hours and with feeding cues.  Educated newborn behavior and feeding habits, STS, I&O, cluster feeding. Discussed positioning and pillows for comfort.  Encouraged to call for assistance. [place] brochure given w/resources, support groups and [place] services. Patient Name: Stephanie Yu VANVB'T Date: 07/09/2016 Reason for consult: Initial assessment   Maternal Data Has patient been taught Hand Expression?: Yes Does the patient have breastfeeding experience prior to this delivery?: No  Feeding    LATCH Score/Interventions       Type of Nipple: Everted at rest and after stimulation  Comfort (Breast/Nipple): Soft / non-tender           Lactation Tools Discussed/Used WIC Program: Yes   Consult Status Consult Status: Follow-up Date: 07/09/16 Follow-up type: In-patient    Theodoro Kalata 07/09/2016, 4:26 AM

## 2016-07-10 MED ORDER — IBUPROFEN 600 MG PO TABS: 600.0000 mg | ORAL_TABLET | Freq: Four times a day (QID) | ORAL | 0 refills | Status: DC | PRN

## 2016-07-10 NOTE — Lactation Note (Signed)
This note was copied from a baby's chart. Lactation Consultation Note  Patient Name: Stephanie Yu OYDXA'J Date: 07/10/2016 Reason for consult: Follow-up assessment;Difficult latch Assisted mom with latching baby to right breast using a 20 mm nipple shield.  Baby latched well in laid back position and nursed actively with audible swallows.  Colostrum in the nipple shield after baby came off.  Mom post pumping a few mls of colostrum.  [place] loaner pump given for home use.  Lactation outpatient services and support information reviewed and encouraged.  Maternal Data    Feeding Feeding Type: Breast Fed Length of feed: 20 min  LATCH Score/Interventions Latch: Grasps breast easily, tongue down, lips flanged, rhythmical sucking. Intervention(s): Adjust position;Assist with latch;Breast massage;Breast compression  Audible Swallowing: Spontaneous and intermittent Intervention(s): Hand expression Intervention(s): Skin to skin;Hand expression;Alternate breast massage  Type of Nipple: Flat  Comfort (Breast/Nipple): Soft / non-tender     Hold (Positioning): Assistance needed to correctly position infant at breast and maintain latch. Intervention(s): Breastfeeding basics reviewed;Position options;Skin to skin;Support Pillows  LATCH Score: 8  Lactation Tools Discussed/Used Tools: Nipple Shields Nipple shield size: 20   Consult Status Consult Status: Complete    Ave Filter 07/10/2016, 12:38 PM

## 2016-07-10 NOTE — Discharge Summary (Signed)
OB Discharge Summary     Patient Name: Stephanie Yu DOB: 1990/06/12 MRN: [id number]  Date of admission: 07/08/2016 Delivering MD: Verner Mould   Date of discharge: 07/10/2016  Admitting diagnosis: INDUCTION Intrauterine pregnancy: [redacted]w[redacted]d     Secondary diagnosis:  Active Problems:   Post term pregnancy at [redacted] weeks gestation  Additional problems: nonreactive NST     Discharge diagnosis: Term Pregnancy Delivered                                                                                                Post partum procedures:none  Augmentation: Pitocin, Cytotec and Foley Balloon  Complications: None  Hospital course:  Induction of Labor With Vaginal Delivery   26 y.o. yo [id number] at [redacted]w[redacted]d was admitted to the hospital 07/08/2016 for induction of labor.  Indication for induction: Postdates and nonreactive NST.  Patient had an uncomplicated labor course as follows: Membrane Rupture Time/Date: 5:00 PM ,07/07/2016   Intrapartum Procedures: Episiotomy: None [1]                                         Lacerations:  2nd degree [3]  Patient had delivery of a Viable infant.  Information for the patient's newborn:  Cathaleen, Korol [[id number]]  Delivery Method: Vaginal, Spontaneous Delivery (Filed from Delivery Summary)   07/08/2016  Details of delivery can be found in separate delivery note.  Patient had a routine postpartum course. Patient is discharged home 07/10/16.  Physical exam  Vitals:   07/08/16 2357 07/09/16 0453 07/09/16 0915 07/10/16 0528  BP: 138/71 122/70 134/79 116/71  Pulse: 99 84 84 71  Resp: 18 18 18 20   Temp: 98.2 F (36.8 C) 98.1 F (36.7 C) 97.7 F (36.5 C) 97.8 F (36.6 C)  TempSrc: Oral Oral Oral Oral  SpO2:   100% 99%  Weight:      Height:       General: alert and cooperative Lochia: appropriate Uterine Fundus: firm Incision: N/A DVT Evaluation: No evidence of DVT seen on physical exam. Labs: Lab Results  Component Value Date   WBC 10.9 (H) 07/08/2016   HGB 12.7 07/08/2016   HCT 36.9 07/08/2016   MCV 90.0 07/08/2016   PLT 281 07/08/2016   No flowsheet data found.  Discharge instruction: per After Visit Summary and "Baby and Me Booklet".  After visit meds:  Allergies as of 07/10/2016      Reactions   Vicodin [hydrocodone-acetaminophen] Itching   Nausea and vomiting   Amoxicillin Nausea And Vomiting, Rash   Childhood reaction Has patient had a PCN reaction causing immediate rash, facial/tongue/throat swelling, SOB or lightheadedness with hypotension: Yes Has patient had a PCN reaction causing severe rash involving mucus membranes or skin necrosis: No Has patient had a PCN reaction that required hospitalization: No Has patient had a PCN reaction occurring within the last 10 years: No If all of the above answers are "NO", then may proceed with Cephalosporin use.   Augmentin [amoxicillin-pot Clavulanate] Nausea And  Vomiting, Rash   Penicillins Nausea And Vomiting, Rash   Has patient had a PCN reaction causing immediate rash, facial/tongue/throat swelling, SOB or lightheadedness with hypotension: No Has patient had a PCN reaction causing severe rash involving mucus membranes or skin necrosis: yes Has patient had a PCN reaction that required hospitalization No Has patient had a PCN reaction occurring within the last 10 years: No If all of the above answers are "NO", then may proceed with Cephalosporin use.      Medication List    STOP taking these medications   ALPRAZolam 1 MG tablet Commonly known as:  XANAX   calcium carbonate 500 MG chewable tablet Commonly known as:  TUMS - dosed in mg elemental calcium   cyclobenzaprine 10 MG tablet Commonly known as:  FLEXERIL   omeprazole 20 MG capsule Commonly known as:  PRILOSEC     TAKE these medications   albuterol 108 (90 Base) MCG/ACT inhaler Commonly known as:  PROVENTIL HFA;VENTOLIN HFA Inhale 1-2 puffs into the lungs every 6 (six) hours as needed  for wheezing or shortness of breath.   ibuprofen 600 MG tablet Commonly known as:  ADVIL,MOTRIN Take 1 tablet (600 mg total) by mouth every 6 (six) hours as needed.   PREPLUS 27-1 MG Tabs Take 1 tablet by mouth daily.       Diet: routine diet  Activity: Advance as tolerated. Pelvic rest for 6 weeks.   Outpatient follow up:6 weeks Follow up Appt:Future Appointments Date Time Provider [place]  08/12/2016 8:30 AM Constant, Vickii Chafe, MD [place] None   Follow up Visit:No Follow-up on file.  Postpartum contraception: Progesterone only pills  Newborn Data: Live born female  Birth Weight: 7 lb 8.6 oz (3419 g) APGAR: 8, 9  Baby Feeding: Breast Disposition:home with mother   07/10/2016 Serita Grammes, CNM  9:01 AM

## 2016-07-10 NOTE — Lactation Note (Signed)
This note was copied from a baby's chart. Lactation Consultation Note  Patient Name: Stephanie Yu Today's Date: 07/10/2016  Mom states baby is not latching to right breast because nipple inverts.  Discussed possible nipple shield use.  [place] phone number left and instructed to call for latch assist when baby starts to cue.   Maternal Data    Feeding Feeding Type: Breast Fed Length of feed: 20 min  LATCH Score/Interventions                      Lactation Tools Discussed/Used     Consult Status      Ave Filter 07/10/2016, 10:58 AM

## 2016-07-10 NOTE — Discharge Instructions (Signed)

## 2016-08-12 ENCOUNTER — Ambulatory Visit: Payer: Medicaid Other | Admitting: Obstetrics and Gynecology

## 2016-08-12 NOTE — Progress Notes (Deleted)
[place]

## 2017-06-29 ENCOUNTER — Encounter (HOSPITAL_COMMUNITY): Payer: Self-pay

## 2017-06-29 ENCOUNTER — Emergency Department (HOSPITAL_COMMUNITY)
Admission: EM | Admit: 2017-06-29 | Discharge: 2017-06-29 | Disposition: A | Discharge status: Home / Self Care | Payer: Medicaid Other | Attending: Emergency Medicine | Admitting: Emergency Medicine

## 2017-06-29 ENCOUNTER — Other Ambulatory Visit: Payer: Self-pay

## 2017-06-29 DIAGNOSIS — R569 Unspecified convulsions: Secondary | ICD-10-CM | POA: Insufficient documentation

## 2017-06-29 DIAGNOSIS — H538 Other visual disturbances: Secondary | ICD-10-CM | POA: Diagnosis not present

## 2017-06-29 DIAGNOSIS — Z87891 Personal history of nicotine dependence: Secondary | ICD-10-CM | POA: Insufficient documentation

## 2017-06-29 LAB — CBC WITH DIFFERENTIAL/PLATELET
Basophils Absolute: 0 10*3/uL (ref 0.0–0.1)
Basophils Relative: 0 %
EOS ABS: 0 10*3/uL (ref 0.0–0.7)
EOS PCT: 0 %
HCT: 43.4 % (ref 36.0–46.0)
Hemoglobin: 14.8 g/dL (ref 12.0–15.0)
LYMPHS ABS: 0.8 10*3/uL (ref 0.7–4.0)
Lymphocytes Relative: 7 %
MCH: 30.6 pg (ref 26.0–34.0)
MCHC: 34.1 g/dL (ref 30.0–36.0)
MCV: 89.7 fL (ref 78.0–100.0)
MONOS PCT: 3 %
Monocytes Absolute: 0.3 10*3/uL (ref 0.1–1.0)
Neutro Abs: 9.8 10*3/uL — ABNORMAL HIGH (ref 1.7–7.7)
Neutrophils Relative %: 90 %
PLATELETS: 321 10*3/uL (ref 150–400)
RBC: 4.84 MIL/uL (ref 3.87–5.11)
RDW: 12.9 % (ref 11.5–15.5)
WBC: 10.8 10*3/uL — ABNORMAL HIGH (ref 4.0–10.5)

## 2017-06-29 LAB — COMPREHENSIVE METABOLIC PANEL
ALK PHOS: 60 U/L (ref 38–126)
ALT: 18 U/L (ref 14–54)
ANION GAP: 9 (ref 5–15)
AST: 21 U/L (ref 15–41)
Albumin: 4.7 g/dL (ref 3.5–5.0)
BILIRUBIN TOTAL: 1 mg/dL (ref 0.3–1.2)
BUN: 12 mg/dL (ref 6–20)
CALCIUM: 9.6 mg/dL (ref 8.9–10.3)
CO2: 24 mmol/L (ref 22–32)
CREATININE: 0.69 mg/dL (ref 0.44–1.00)
Chloride: 108 mmol/L (ref 101–111)
GFR calc Af Amer: >60 mL/min (ref >60)
GFR calc non Af Amer: >60 mL/min (ref >60)
GLUCOSE: 122 mg/dL — AB (ref 65–99)
Potassium: 4.2 mmol/L (ref 3.5–5.1)
Sodium: 141 mmol/L (ref 135–145)
TOTAL PROTEIN: 8.3 g/dL — AB (ref 6.5–8.1)

## 2017-06-29 LAB — CBG MONITORING, ED: Glucose-Capillary: 113 mg/dL — ABNORMAL HIGH (ref 65–99)

## 2017-06-29 LAB — I-STAT CHEM 8, ED
BUN: 11 mg/dL (ref 6–20)
CREATININE: 0.7 mg/dL (ref 0.44–1.00)
Calcium, Ion: 1.12 mmol/L — ABNORMAL LOW (ref 1.15–1.40)
Chloride: 106 mmol/L (ref 101–111)
GLUCOSE: 117 mg/dL — AB (ref 65–99)
HCT: 46 % (ref 36.0–46.0)
HEMOGLOBIN: 15.6 g/dL — AB (ref 12.0–15.0)
Potassium: 4.1 mmol/L (ref 3.5–5.1)
Sodium: 141 mmol/L (ref 135–145)
TCO2: 23 mmol/L (ref 22–32)

## 2017-06-29 LAB — I-STAT BETA HCG BLOOD, ED (MC, WL, AP ONLY)

## 2017-06-29 MED ORDER — LEVETIRACETAM ER 500 MG PO TB24: 500.0000 mg | ORAL_TABLET | Freq: Every day | ORAL | 0 refills | Status: DC

## 2017-06-29 MED ORDER — LEVETIRACETAM IN NACL 1000 MG/100ML IV SOLN
1000.0000 mg | Freq: Once | INTRAVENOUS | Status: AC
  Administered 2017-06-29: 1000 mg via INTRAVENOUS
  Filled 2017-06-29: qty 100

## 2017-06-29 NOTE — ED Triage Notes (Addendum)
Per EMS, patient coming from court, witnessed seizure today. Post ictal upon EMS arrival. Hx seizures. Denies taking seizure medication. Hematoma to right cheek. A&Ox4.  CBG 119

## 2017-06-29 NOTE — ED Provider Notes (Addendum)
[facility] DEPT Provider Note   CSN: [id number] Arrival date & time: 06/29/17  1130     History   Chief Complaint Chief Complaint  Patient presents with  . Seizures    HPI Stephanie Yu is a 27 y.o. female.  The history is provided by the patient and medical records.  Seizures   This is a recurrent problem. The current episode started 1 to 2 hours ago. The problem has been resolved. There was 1 seizure. Duration: Unsure. Associated symptoms include visual disturbances (blurry, dilation of pupils). Pertinent negatives include no sleepiness, no confusion, no headaches, no speech difficulty, no neck stiffness, no sore throat, no chest pain, no cough, no nausea, no vomiting, no diarrhea and no muscle weakness. Characteristics include rhythmic jerking and loss of consciousness. Characteristics do not include bowel incontinence, bladder incontinence or bit tongue. The episode was witnessed. There was the sensation of an aura present. The seizures did not continue in the ED. Possible causes include missed seizure meds. Possible causes do not include medication or dosage change, sleep deprivation, recent illness or change in alcohol use. has not been taking her meds for several years There has been no fever. There were no medications administered prior to arrival.  patient with hx of seizure disorder had witnessed tonic clonic seizure at court today. Length of time unknown. Patient was postictal. She is currently A&O. She has not been on any medications for seizure in several years. She denies recent illness, ETOH.  Past Medical History:  Diagnosis Date  . Allergic rhinitis   . Anxiety   . Asthma   . Depression   . Miscarriage   . Seizures [facility])     Patient Active Problem List   Diagnosis Date Noted  . Post term pregnancy at [redacted] weeks gestation 07/08/2016  . Velamentous insertion of umbilical cord in third trimester 02/06/2016  . 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 Seizure disorder ([place]) 12/08/2015  . Supervision of high risk pregnancy, antepartum 12/08/2015  . Allergic rhinitis 12/08/2015  . Asthma, mild intermittent 12/08/2015  . Panic anxiety syndrome 12/08/2015    Past Surgical History:  Procedure Laterality Date  . DILATION AND CURETTAGE OF UTERUS    . DILATION AND EVACUATION N/A 09/08/2015   Procedure: DILATATION AND EVACUATION;  Surgeon: Aletha Halim, MD;  Location: [facility];  Service: Gynecology;  Laterality: N/A;     OB History    Gravida  3   Para  1   Term  1   Preterm  0   AB  2   Living  1     SAB  1   TAB  1   Ectopic  0   Multiple  0   Live Births  1            Home Medications    Prior to Admission medications   Medication Sig Start Date End Date Taking? Authorizing Provider  albuterol (PROVENTIL HFA;VENTOLIN HFA) 108 (90 Base) MCG/ACT inhaler Inhale 1-2 puffs into the lungs every 6 (six) hours as needed for wheezing or shortness of breath.   Yes [provider]  alprazolam Duanne Moron) 2 MG tablet Take 2 mg by mouth 2 (two) times daily as needed for anxiety.  06/23/17  Yes [provider]  amphetamine-dextroamphetamine (ADDERALL) 20 MG tablet Take 20 mg by mouth 3 (three) times daily. 06/20/17  Yes [provider]    Family History Family  History  Problem Relation Age of Onset  . Hypertension Mother   . Heart disease Mother   . Diabetes Mother   . Hypertension Father   . Gout Father   . Anxiety disorder Father   . Lupus Maternal Grandmother   . Gout Paternal Grandmother   . Seizures Sister     Social History Social History   Tobacco Use  . Smoking status: Former Smoker    Packs/day: 0.25    Years: 10.00    Pack years: 2.50    Types: Cigarettes  . Smokeless tobacco: Never Used  . Tobacco comment: Quit 10/11/15  Substance Use Topics  . Alcohol use: No    Comment: Quit 10/11/15  . Drug use: No      Allergies   Vicodin [hydrocodone-acetaminophen]; Amoxicillin; Augmentin [amoxicillin-pot clavulanate]; and Penicillins   Review of Systems Review of Systems  HENT: Negative for sore throat.   Eyes: Positive for visual disturbance (blurry, dilation of pupils).  Respiratory: Negative for cough.   Cardiovascular: Negative for chest pain.  Gastrointestinal: Negative for bowel incontinence, diarrhea, nausea and vomiting.  Genitourinary: Negative for bladder incontinence.  Neurological: Positive for seizures and loss of consciousness. Negative for speech difficulty and headaches.  Psychiatric/Behavioral: Negative for confusion.     Physical Exam Updated Vital Signs BP 109/79 (BP Location: Right Arm)   Pulse 82   Temp 98.5 F (36.9 C)   Resp 18   Ht 5\' 3"  (1.6 m)   Wt 83.9 kg (185 lb)   LMP 06/22/2017 (Approximate)   SpO2 100%   BMI 32.77 kg/m   Physical Exam  Constitutional: She is oriented to person, place, and time. She appears well-developed and well-nourished. No distress.  HENT:  Head: Normocephalic and atraumatic.  No mouth trauma  Eyes: Conjunctivae are normal. No scleral icterus.  Neck: Normal range of motion.  Cardiovascular: Normal rate, regular rhythm and normal heart sounds. Exam reveals no gallop and no friction rub.  No murmur heard. Pulmonary/Chest: Effort normal and breath sounds normal. No respiratory distress.  Abdominal: Soft. Bowel sounds are normal. She exhibits no distension and no mass. There is no tenderness. There is no guarding.  Neurological: She is alert and oriented to person, place, and time.  Skin: Skin is warm and dry. She is not diaphoretic.  Psychiatric: Her behavior is normal.  Nursing note and vitals reviewed.    ED Treatments / Results  Labs (all labs ordered are listed, but only abnormal results are displayed) Labs Reviewed  CBC WITH DIFFERENTIAL/PLATELET - Abnormal; Notable for the following components:      Result Value    WBC 10.8 (*)    Neutro Abs 9.8 (*)    All other components within normal limits  CBG MONITORING, ED - Abnormal; Notable for the following components:   Glucose-Capillary 113 (*)    All other components within normal limits  COMPREHENSIVE METABOLIC PANEL  I-STAT BETA HCG BLOOD, ED ([facility], [facility], [facility] ONLY)  I-STAT CHEM 8, ED    EKG None  Radiology No results found.  Procedures Procedures (including critical care time)  Medications Ordered in ED Medications  levETIRAcetam (KEPPRA) IVPB 1000 mg/100 mL premix (has no administration in time range)     Initial Impression / Assessment and Plan / ED Course  I have reviewed the triage vital signs and the nursing notes.  Pertinent labs & imaging results that were available during my care of the patient were reviewed by me and considered in my medical  decision making (see chart for details).     Patient with history of seizure disorder.  Noncompliant with her medications.  She states "I do not like the way may make me feel."  Patient been given a dose of Keppra here without much side effect.  It is available on the Medicaid coverage list.  Will discharge with 500 mg Keppra ER.  Patient advised to follow close with her PCP.  She appears appropriate for discharge at this time  Final Clinical Impressions(s) / ED Diagnoses   Final diagnoses:  None    ED Discharge Orders    None       Margarita Mail, PA-C 06/29/17 1557    Daleen Bo, MD 06/29/17 1634    Margarita Mail, PA-C 07/19/17 1458    Daleen Bo, MD 07/19/17 [phone]

## 2017-06-29 NOTE — ED Notes (Signed)
Pt reports that she has not taken her seizure medication in 3 years because it "makes her feel funny." Reports she was prescribed Gabapentin

## 2017-06-29 NOTE — Discharge Instructions (Addendum)
Get help right away if: You have a seizure: That lasts longer than 5 minutes. That is different than previous seizures. That leaves you unable to speak or use a part of your body. That makes it harder to breathe. After a head injury. You have: Multiple seizures in a row. Confusion or a severe headache right after a seizure. You are having seizures more often. You do not wake up immediately after a seizure. You injure yourself during a seizure.  Department of Motor Vehicle [facility]) of [place] regulations for seizures - It is the patient?s responsibility to report the incidence of the seizure in the state of [place]. [place] has no statutory provision requiring physicians to report patients diagnosed with epilepsy or seizures to a central state agency.  The recommended DMV regulation requirement for a driver in [place] for an individual with a seizure is that they be seizure-free for 6-12 months. However, the DMV may consider the following exceptions to this general rule where: (1) a physician-directed change in medication causes a seizure and the individual immediately resumes the previous therapy which controlled seizures; (2) there is a history of nocturnal seizures or seizures which do not involve loss of consciousness, loss of control of motor function, or loss of appropriate sensation and information process; and (3) an individual has a seizure disorder preceded by an aura (warning) lasting 2-3 minutes. While the [employer] may also give consideration to other unusual circumstances which may affect the general requirement that drivers be seizure-free for 6-12 months, interpretation of these circumstances and assignment of restrictions is at the discretion of the Medical Advisor. The DMV also considers compliance with medical therapy essential for safe driving. [facility] Physician's Guide to [employer] (June, 1995 ed.)] The Department learns of an individual's condition by inquiring on the  application form or renewal form, a physician's report to the [facility], an accident report or from correspondence from the individual. The person may be required to submit a Medical Report Form either annually or semi-annually.

## 2017-06-29 NOTE — ED Notes (Signed)
Bed: WA02 Expected date:  Expected time:  Means of arrival:  Comments: EMS seizure 

## 2018-11-19 ENCOUNTER — Inpatient Hospital Stay (HOSPITAL_COMMUNITY)
Admission: AD | Admit: 2018-11-19 | Discharge: 2018-11-19 | Disposition: A | Discharge status: Home / Self Care | Payer: Medicaid Other | Attending: Obstetrics and Gynecology | Admitting: Obstetrics and Gynecology

## 2018-11-19 ENCOUNTER — Inpatient Hospital Stay (HOSPITAL_COMMUNITY): Payer: Medicaid Other

## 2018-11-19 ENCOUNTER — Encounter (HOSPITAL_COMMUNITY): Payer: Self-pay

## 2018-11-19 ENCOUNTER — Other Ambulatory Visit: Payer: Self-pay

## 2018-11-19 DIAGNOSIS — Z3A01 Less than 8 weeks gestation of pregnancy: Secondary | ICD-10-CM | POA: Insufficient documentation

## 2018-11-19 DIAGNOSIS — O26899 Other specified pregnancy related conditions, unspecified trimester: Secondary | ICD-10-CM

## 2018-11-19 DIAGNOSIS — Z87891 Personal history of nicotine dependence: Secondary | ICD-10-CM | POA: Insufficient documentation

## 2018-11-19 DIAGNOSIS — J45909 Unspecified asthma, uncomplicated: Secondary | ICD-10-CM | POA: Insufficient documentation

## 2018-11-19 DIAGNOSIS — F419 Anxiety disorder, unspecified: Secondary | ICD-10-CM | POA: Insufficient documentation

## 2018-11-19 DIAGNOSIS — Z79899 Other long term (current) drug therapy: Secondary | ICD-10-CM | POA: Insufficient documentation

## 2018-11-19 DIAGNOSIS — O209 Hemorrhage in early pregnancy, unspecified: Secondary | ICD-10-CM | POA: Insufficient documentation

## 2018-11-19 DIAGNOSIS — O99511 Diseases of the respiratory system complicating pregnancy, first trimester: Secondary | ICD-10-CM | POA: Diagnosis not present

## 2018-11-19 DIAGNOSIS — O99341 Other mental disorders complicating pregnancy, first trimester: Secondary | ICD-10-CM | POA: Diagnosis not present

## 2018-11-19 DIAGNOSIS — R109 Unspecified abdominal pain: Secondary | ICD-10-CM | POA: Insufficient documentation

## 2018-11-19 DIAGNOSIS — O26891 Other specified pregnancy related conditions, first trimester: Secondary | ICD-10-CM | POA: Diagnosis present

## 2018-11-19 DIAGNOSIS — M549 Dorsalgia, unspecified: Secondary | ICD-10-CM | POA: Diagnosis not present

## 2018-11-19 LAB — CBC
HCT: 40.6 % (ref 36.0–46.0)
Hemoglobin: 14.4 g/dL (ref 12.0–15.0)
MCH: 31.1 pg (ref 26.0–34.0)
MCHC: 35.5 g/dL (ref 30.0–36.0)
MCV: 87.7 fL (ref 80.0–100.0)
Platelets: 327 10*3/uL (ref 150–400)
RBC: 4.63 MIL/uL (ref 3.87–5.11)
RDW: 12.6 % (ref 11.5–15.5)
WBC: 15.6 10*3/uL — ABNORMAL HIGH (ref 4.0–10.5)
nRBC: 0 % (ref 0.0–0.2)

## 2018-11-19 LAB — URINALYSIS, ROUTINE W REFLEX MICROSCOPIC
Bacteria, UA: NONE SEEN
Bilirubin Urine: NEGATIVE
Glucose, UA: NEGATIVE mg/dL
Ketones, ur: 5 mg/dL — AB
Leukocytes,Ua: NEGATIVE
Nitrite: NEGATIVE
Protein, ur: 100 mg/dL — AB
Specific Gravity, Urine: 1.019 (ref 1.005–1.030)
pH: 9 — ABNORMAL HIGH (ref 5.0–8.0)

## 2018-11-19 LAB — WET PREP, GENITAL
Clue Cells Wet Prep HPF POC: NONE SEEN
Sperm: NONE SEEN
Trich, Wet Prep: NONE SEEN
Yeast Wet Prep HPF POC: NONE SEEN

## 2018-11-19 LAB — HCG, QUANTITATIVE, PREGNANCY: hCG, Beta Chain, Quant, S: 81660 m[IU]/mL — ABNORMAL HIGH (ref <5)

## 2018-11-19 LAB — POCT PREGNANCY, URINE: Preg Test, Ur: POSITIVE — AB

## 2018-11-19 IMAGING — US US OB COMP LESS 14 WK
1 series · 15 of 24 positions shown · non-contrast
Comparison: None.

CLINICAL DATA: Abdominal cramping.

EXAM:
OBSTETRIC <14 WK ULTRASOUND
TECHNIQUE: Transabdominal ultrasound was performed for evaluation of the
gestation as well as the maternal uterus and adnexal regions.

[Series 1: us ob comp less 14 wk · 24 acquisitions, 15 frames shown]
[im 1/24]
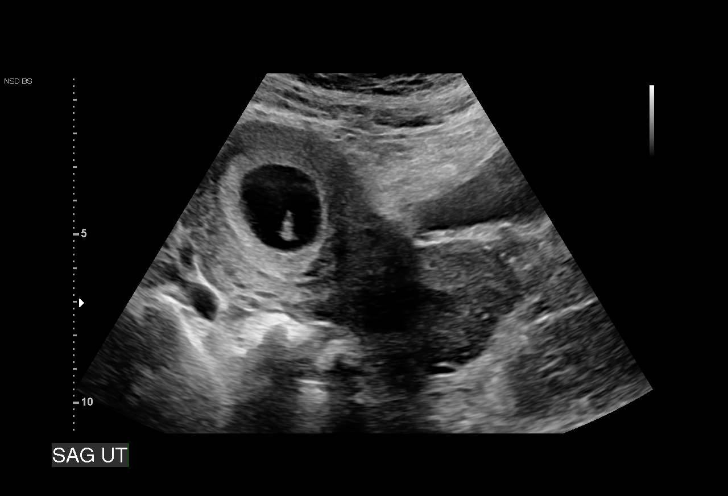
[im 3/24]
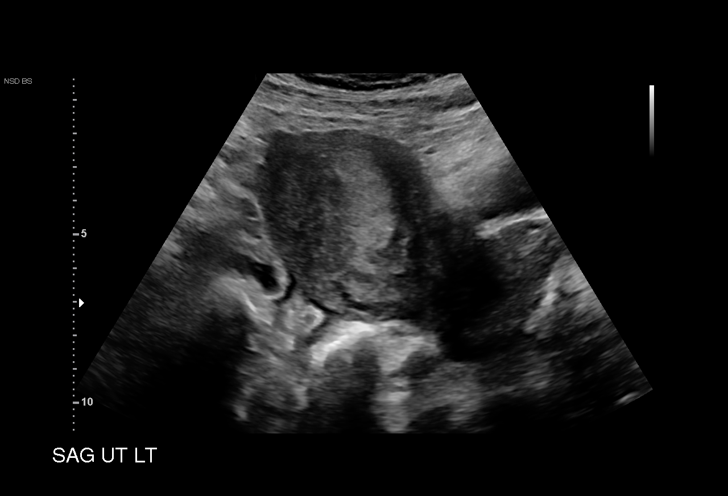
[im 5/24]
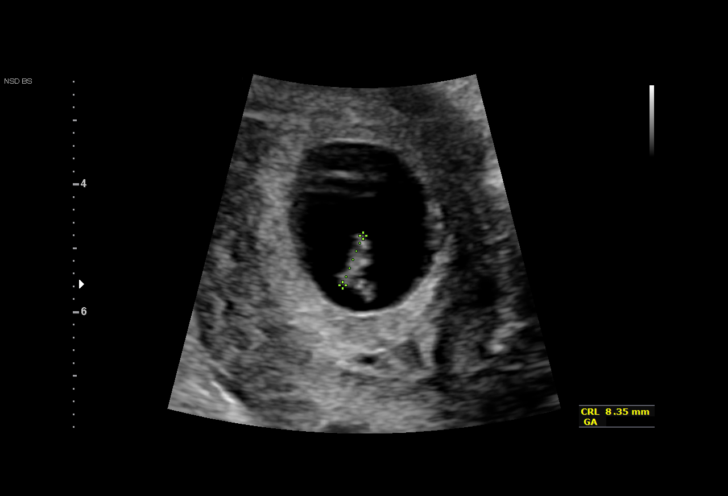
[im 6/24]
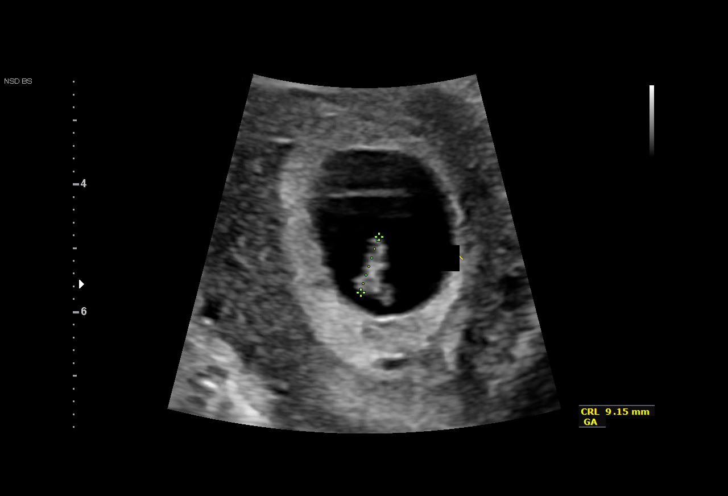
[im 8/24]
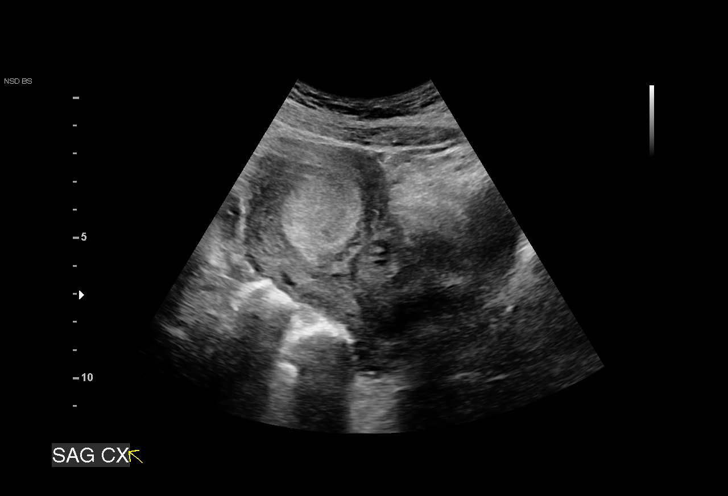
[im 9/24]
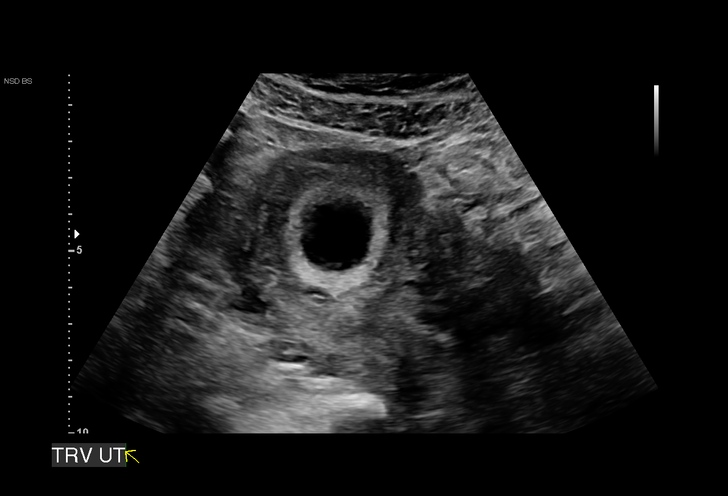
[im 11/24]
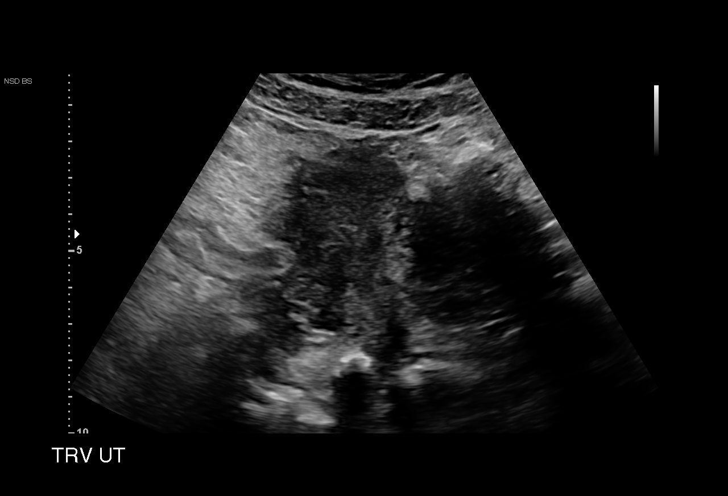
[im 13/24]
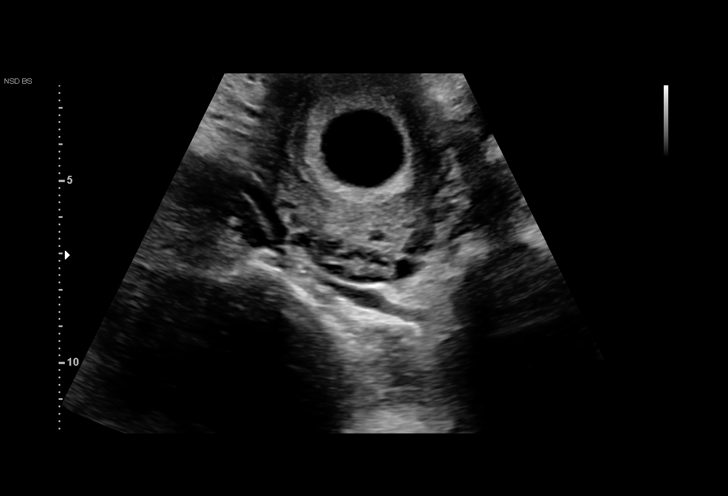
[im 14/24]
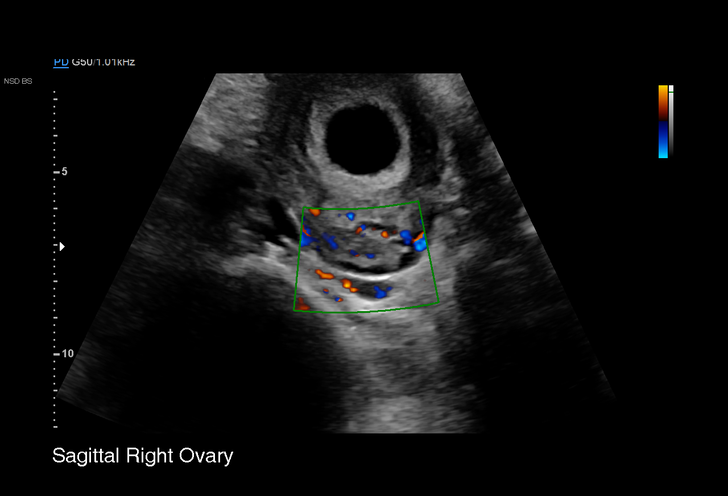
[im 16/24]
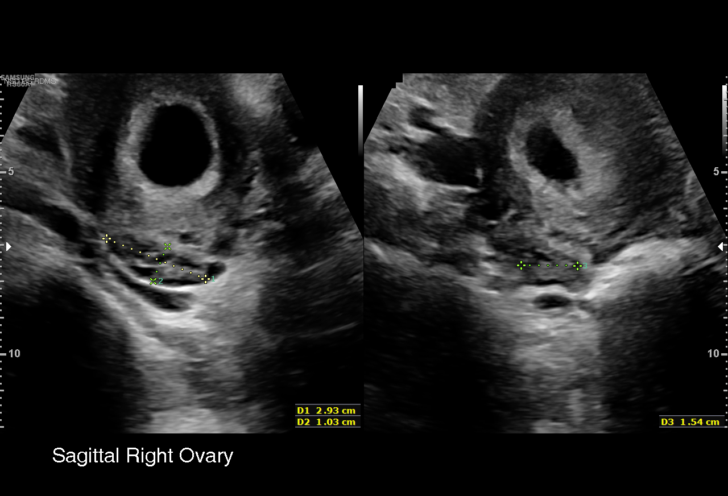
[im 17/24]
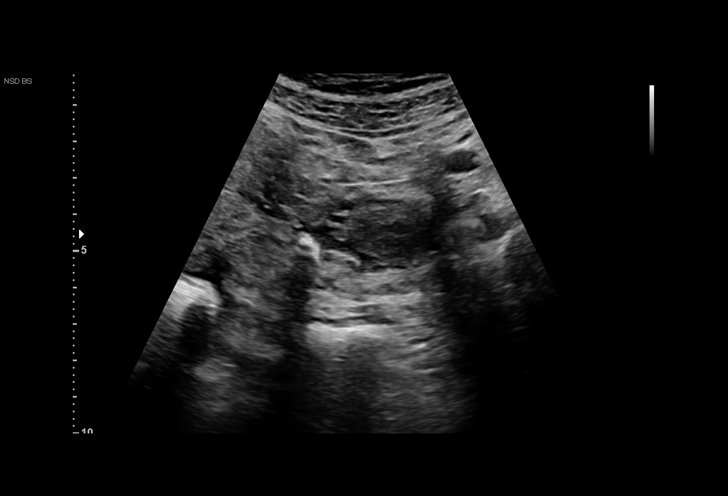
[im 19/24]
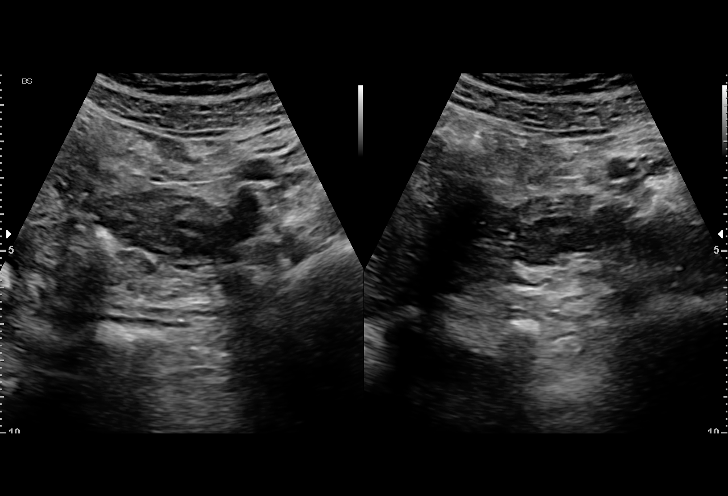
[im 21/24]
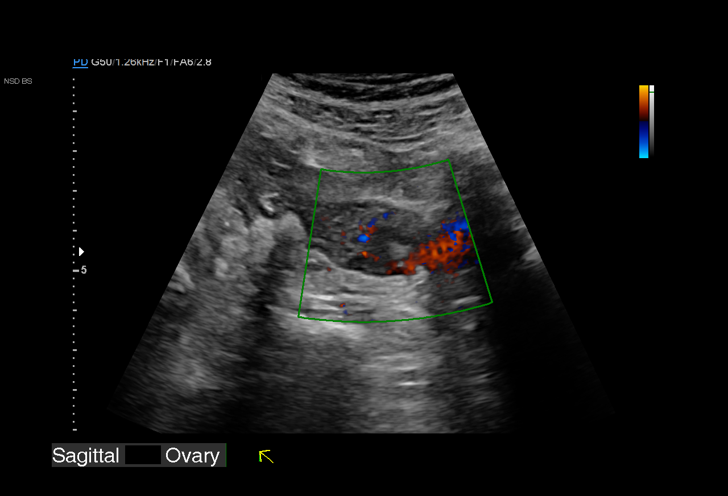
[im 22/24]
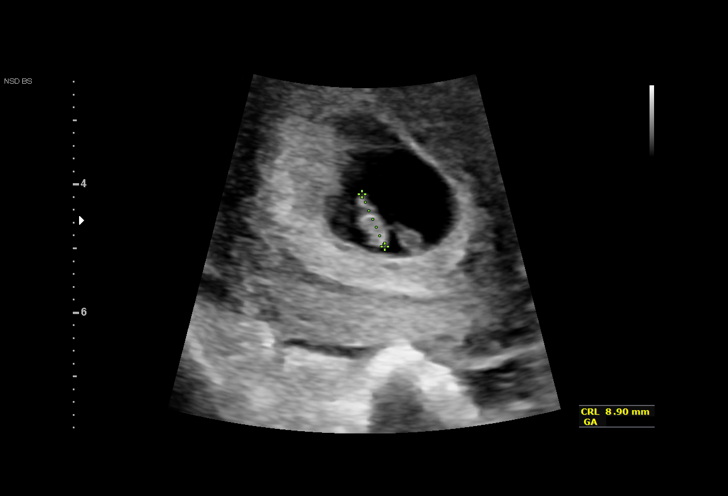
[im 24/24]
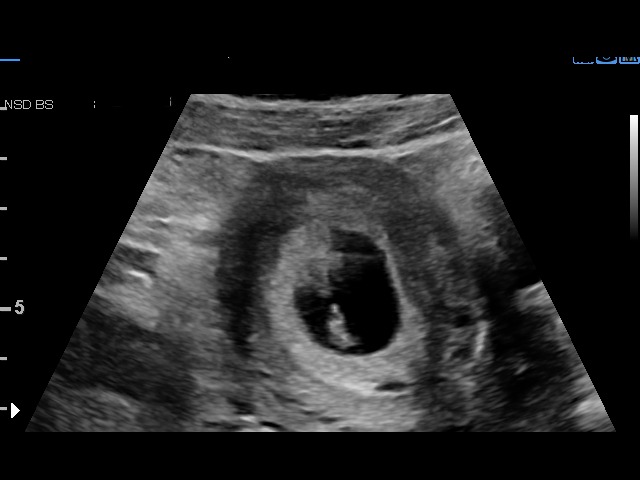

[15 of 24 positions shown; findings below may reference images not displayed]

FINDINGS: Intrauterine gestational sac: Single

Yolk sac:  Visualized.

Embryo:  Visualized.

Cardiac Activity: Visualized.

Heart Rate: 143 bpm

CRL:   8.8 mm   6 w 5 d                  US EDC: 07/10/2019

Maternal uterus/adnexae:

Subchorionic hemorrhage: None

Right ovary: Normal

Left ovary: Normal

Other :None

Free fluid:  Small
IMPRESSION: 1. Single living intrauterine gestation with an estimated
gestational age of 6 weeks and 5 days. No complications.
2. Small volume of free fluid in the pelvis.  Nonspecific.

## 2018-11-19 MED ORDER — OXYCODONE-ACETAMINOPHEN 5-325 MG PO TABS
1.0000 | ORAL_TABLET | Freq: Once | ORAL | Status: AC
  Administered 2018-11-19: 19:00:00 1 via ORAL
  Filled 2018-11-19: qty 1

## 2018-11-19 MED ORDER — PROMETHAZINE HCL 25 MG/ML IJ SOLN
25.0000 mg | Freq: Four times a day (QID) | INTRAMUSCULAR | Status: DC | PRN
  Administered 2018-11-19: 21:00:00 25 mg via INTRAVENOUS
  Filled 2018-11-19: qty 1

## 2018-11-19 MED ORDER — SODIUM CHLORIDE 0.9 % IV SOLN
8.0000 mg | Freq: Once | INTRAVENOUS | Status: DC
  Filled 2018-11-19: qty 4

## 2018-11-19 MED ORDER — LACTATED RINGERS IV BOLUS
1000.0000 mL | Freq: Once | INTRAVENOUS | Status: AC
  Administered 2018-11-19: 1000 mL via INTRAVENOUS

## 2018-11-19 MED ORDER — CYCLOBENZAPRINE HCL 10 MG PO TABS
10.0000 mg | ORAL_TABLET | Freq: Once | ORAL | Status: AC
  Administered 2018-11-19: 23:00:00 10 mg via ORAL
  Filled 2018-11-19: qty 1

## 2018-11-19 NOTE — MAU Provider Note (Addendum)
History     CSN: [id number]  Arrival date and time: 11/19/18 1737   First Provider Initiated Contact with Patient 11/19/18 1828      Chief Complaint  Patient presents with  . Abdominal Pain  . Back Pain  . Vaginal Bleeding   [id number] @[redacted]w[redacted]d  by unsure LMP presenting with LAP. Reports onset last night. Describes as intermittent cramping similar to labor ctx. Pain makes her involuntarily bear down. Rates 9/10. Has not tried anything for it. Denies VB or discharge. Denies urinary sx. Admits to MJ use.   OB History    Gravida  4   Para  1   Term  1   Preterm  0   AB  2   Living  1     SAB  1   TAB  1   Ectopic  0   Multiple  0   Live Births  1           Past Medical History:  Diagnosis Date  . Allergic rhinitis   . Anxiety   . Asthma   . Depression   . Miscarriage   . Seizures ([place])     Past Surgical History:  Procedure Laterality Date  . DILATION AND CURETTAGE OF UTERUS    . DILATION AND EVACUATION N/A 09/08/2015   Procedure: DILATATION AND EVACUATION;  Surgeon: Aletha Halim, MD;  Location: [facility];  Service: Gynecology;  Laterality: N/A;    Family History  Problem Relation Age of Onset  . Hypertension Mother   . Heart disease Mother   . Diabetes Mother   . Hypertension Father   . Gout Father   . Anxiety disorder Father   . Lupus Maternal Grandmother   . Gout Paternal Grandmother   . Seizures Sister     Social History   Tobacco Use  . Smoking status: Former Smoker    Packs/day: 0.25    Years: 10.00    Pack years: 2.50    Types: Cigarettes  . Smokeless tobacco: Never Used  . Tobacco comment: Quit 10/11/15  Substance Use Topics  . Alcohol use: No    Comment: Quit 10/11/15  . Drug use: Yes    Types: Marijuana    Comment: last was 11/18/18    Allergies:  Allergies  Allergen Reactions  . Vicodin [Hydrocodone-Acetaminophen] Itching and Nausea And Vomiting  . Amoxicillin Nausea And Vomiting, Rash and Other (See Comments)   Childhood reaction Has patient had a PCN reaction causing immediate rash, facial/tongue/throat swelling, SOB or lightheadedness with hypotension: Yes Has patient had a PCN reaction causing severe rash involving mucus membranes or skin necrosis: No Has patient had a PCN reaction that required hospitalization: No Has patient had a PCN reaction occurring within the last 10 years: No If all of the above answers are "NO", then may proceed with Cephalosporin use.  . Augmentin [Amoxicillin-Pot Clavulanate] Nausea And Vomiting, Rash and Other (See Comments)    Has patient had a PCN reaction causing immediate rash, facial/tongue/throat swelling, SOB or lightheadedness with hypotension: yes Has patient had a PCN reaction causing severe rash involving mucus membranes or skin necrosis: no Has patient had a PCN reaction that required hospitalization: no Has patient had a PCN reaction occurring within the last 10 years: no If all of the above answers are "NO", then may proceed with Cephalosporin use.   Marland Kitchen Penicillins Nausea And Vomiting, Rash and Other (See Comments)    Has patient had a PCN reaction causing immediate rash, facial/tongue/throat  swelling, SOB or lightheadedness with hypotension: No Has patient had a PCN reaction causing severe rash involving mucus membranes or skin necrosis: yes Has patient had a PCN reaction that required hospitalization No Has patient had a PCN reaction occurring within the last 10 years: No If all of the above answers are "NO", then may proceed with Cephalosporin use.     Medications Prior to Admission  Medication Sig Dispense Refill Last Dose  . albuterol (PROVENTIL HFA;VENTOLIN HFA) 108 (90 Base) MCG/ACT inhaler Inhale 1-2 puffs into the lungs every 6 (six) hours as needed for wheezing or shortness of breath.     . alprazolam (XANAX) 2 MG tablet Take 2 mg by mouth 2 (two) times daily as needed for anxiety.   5   . amphetamine-dextroamphetamine (ADDERALL) 20 MG tablet  Take 20 mg by mouth 3 (three) times daily.  0   . levETIRAcetam (KEPPRA XR) 500 MG 24 hr tablet Take 1 tablet (500 mg total) by mouth daily. 30 tablet 0     Review of Systems  Constitutional: Negative for chills and fever.  Gastrointestinal: Positive for abdominal pain, nausea and vomiting. Negative for constipation and diarrhea.  Genitourinary: Negative for dysuria, vaginal bleeding and vaginal discharge.   Physical Exam   Blood pressure 123/72, pulse 96, temperature 98.3 F (36.8 C), temperature source Oral, resp. rate 18, height 5\' 3"  (1.6 m), weight 73.6 kg, last menstrual period 10/19/2018, SpO2 100 %, unknown if currently breastfeeding.  Physical Exam  Nursing note and vitals reviewed. Constitutional: She is oriented to person, place, and time. She appears well-developed and well-nourished. No distress.  HENT:  Head: Normocephalic and atraumatic.  Neck: Normal range of motion.  Cardiovascular: Normal rate.  Respiratory: Effort normal. No respiratory distress.  GI: Soft. She exhibits no distension and no mass. There is no abdominal tenderness. There is no rebound, no guarding and no CVA tenderness.  Genitourinary:    Genitourinary Comments: External: no lesions or erythema Vagina: rugated, pink, moist, scant thin white discharge Uterus: non enlarged, anteverted, + tender, no CMT Adnexae: no masses, + tenderness left, + tenderness right Cervix closed    Musculoskeletal: Normal range of motion.     Cervical back: Normal.     Thoracic back: Normal.     Lumbar back: Normal.  Neurological: She is alert and oriented to person, place, and time.  Skin: Skin is warm and dry.  Psychiatric: Her behavior is normal. Judgment and thought content normal. Her mood appears anxious (teary).   Results for orders placed or performed during the hospital encounter of 11/19/18 (from the past 24 hour(s))  Pregnancy, urine POC     Status: Abnormal   Collection Time: 11/19/18  6:00 PM  Result  Value Ref Range   Preg Test, Ur POSITIVE (A) NEGATIVE  Urinalysis, Routine w reflex microscopic     Status: Abnormal   Collection Time: 11/19/18  6:27 PM  Result Value Ref Range   Color, Urine YELLOW YELLOW   APPearance HAZY (A) CLEAR   Specific Gravity, Urine 1.019 1.005 - 1.030   pH 9.0 (H) 5.0 - 8.0   Glucose, UA NEGATIVE NEGATIVE mg/dL   Hgb urine dipstick SMALL (A) NEGATIVE   Bilirubin Urine NEGATIVE NEGATIVE   Ketones, ur 5 (A) NEGATIVE mg/dL   Protein, ur 100 (A) NEGATIVE mg/dL   Nitrite NEGATIVE NEGATIVE   Leukocytes,Ua NEGATIVE NEGATIVE   RBC / HPF 6-10 0 - 5 RBC/hpf   WBC, UA 0-5 0 - 5 WBC/hpf  Bacteria, UA NONE SEEN NONE SEEN   Squamous Epithelial / LPF 0-5 0 - 5   Mucus PRESENT   CBC     Status: Abnormal   Collection Time: 11/19/18  6:40 PM  Result Value Ref Range   WBC 15.6 (H) 4.0 - 10.5 K/uL   RBC 4.63 3.87 - 5.11 MIL/uL   Hemoglobin 14.4 12.0 - 15.0 g/dL   HCT 40.6 36.0 - 46.0 %   MCV 87.7 80.0 - 100.0 fL   MCH 31.1 26.0 - 34.0 pg   MCHC 35.5 30.0 - 36.0 g/dL   RDW 12.6 11.5 - 15.5 %   Platelets 327 150 - 400 K/uL   nRBC 0.0 0.0 - 0.2 %  hCG, quantitative, pregnancy     Status: Abnormal   Collection Time: 11/19/18  6:40 PM  Result Value Ref Range   hCG, Beta Chain, Quant, S 81,660 (H) <5 mIU/mL  Wet prep, genital     Status: Abnormal   Collection Time: 11/19/18  6:40 PM  Result Value Ref Range   Yeast Wet Prep HPF POC NONE SEEN NONE SEEN   Trich, Wet Prep NONE SEEN NONE SEEN   Clue Cells Wet Prep HPF POC NONE SEEN NONE SEEN   WBC, Wet Prep HPF POC MODERATE (A) NONE SEEN   Sperm NONE SEEN    No results found.  MAU Course  Procedures Meds ordered this encounter  Medications  . oxyCODONE-acetaminophen (PERCOCET/ROXICET) 5-325 MG per tablet 1 tablet   MDM Labs and [place] ordered and reviewed. Normal IUP on [place]. Pain minimally improved. Pt reports trying tub soaks at home for the pain. She admits to daily MJ use since age 72. She also reports N/V x3  days. Suspect cannabinoid hyperemesis. Will try IV hydration and antiemetics. Transfer of care given to [place], [place]  11/19/2018 8:49 PM   Assessment and Plan  Reassessment (10:08 PM)  Patient reports that nausea has subsided. She reports continued pelvic and back pain that is about the same. Nurse instructed to remove patient IV.  Will plan to send phenergan to pharmacy on file.  Will give flexeril and reassess.  Reassessment (11:27 PM) -Nurse reports patient left AMA. -No Flexeril given.  Maryann Conners MSN, CNM Advanced Practice Provider, [facility]

## 2018-11-19 NOTE — MAU Note (Signed)
Pt in mau pt bathroom attempting to give sample. Unsure if she will be able to since she believes she is dehydrated. Informed pt why we need a urine sample. Pt reports she has not done a UPT at home but knows she is pregnant because everyone knows when they are pregnant.

## 2018-11-19 NOTE — MAU Note (Signed)
Stephanie Yu is a 28 y.o. at [redacted]w[redacted]d here in MAU reporting: states last week she knew she was pregnant (was having breast soreness and vomiting). Yesterday started having back pain and some cramping. Saw a little bit of bleeding today, was just on the toilet paper. Also having nausea/vomiting, reports marijuana use yesterday.  Onset of complaint: yesterday  Pain score: abdominal pain 9/10, back pain 9/10  Vitals:   11/19/18 1819  BP: 123/72  Pulse: 96  Resp: 18  Temp: 98.3 F (36.8 C)  SpO2: 100%      Lab orders placed from triage: UPT, UA

## 2018-11-19 NOTE — MAU Note (Signed)
Patient left without being discharged by RN. Patient took out IV. IV catheter found in room. Catheter tip intact. CNM notified.

## 2018-11-20 LAB — CULTURE, OB URINE: Culture: <10000 — AB

## 2018-11-21 LAB — CERVICOVAGINAL ANCILLARY ONLY
Chlamydia: NEGATIVE
Neisseria Gonorrhea: NEGATIVE

## 2019-02-23 NOTE — L&D Delivery Note (Signed)
OB/GYN Faculty Practice Delivery Note  Stephanie Yu is a 29 y.o. [id number] s/p NSVD at [redacted]w[redacted]d. She was admitted after delivering at home.   GBS Status: unknown  Labor Progress: . Patient delivered at home. Arrived via EMS with placenta still undelivered  Delivery Date: 06/22/19  Delivery: Patient delivered at home. She arrived via EMS with placenta still undelivered. She was placed into the dorsal lithotomy position and the placenta delivered with ease via Duncan mechanism. Fundus was firm with massage and IM pitocin. There was a second degree laceration that was repaired with 3-0 Monocryl in the usual fashion using lidocaine for local analgesia. Liletta IUD was placed at the patient's request, please see procedure note for details.  Placenta: 3 vessel cord, intact, to L&D Complications: None immediate Lacerations: 2nd degree, repaired with 3-0 Vicryl in the usual fashion EBL: 100 Analgesia: 1% lidocaine for repaire  Postpartum Planning [x]  message to sent to schedule follow-up  [x]  vaccines UTD  Infant: female  weight per medical record  Merilyn Baba, DO OB/GYN Fellow, Faculty Practice

## 2019-06-22 ENCOUNTER — Inpatient Hospital Stay (HOSPITAL_COMMUNITY)
Admission: AD | Admit: 2019-06-22 | Discharge: 2019-06-24 | DRG: 807 | Disposition: A | Discharge status: Home / Self Care | Payer: Medicaid Other | Attending: Obstetrics and Gynecology | Admitting: Obstetrics and Gynecology

## 2019-06-22 ENCOUNTER — Encounter (HOSPITAL_COMMUNITY): Payer: Self-pay | Admitting: Obstetrics and Gynecology

## 2019-06-22 DIAGNOSIS — Z141 Cystic fibrosis carrier: Secondary | ICD-10-CM | POA: Diagnosis not present

## 2019-06-22 DIAGNOSIS — Z880 Allergy status to penicillin: Secondary | ICD-10-CM

## 2019-06-22 DIAGNOSIS — Z30430 Encounter for insertion of intrauterine contraceptive device: Secondary | ICD-10-CM | POA: Diagnosis not present

## 2019-06-22 DIAGNOSIS — Z3A37 37 weeks gestation of pregnancy: Secondary | ICD-10-CM

## 2019-06-22 DIAGNOSIS — Z20822 Contact with and (suspected) exposure to covid-19: Secondary | ICD-10-CM | POA: Diagnosis present

## 2019-06-22 DIAGNOSIS — Z87891 Personal history of nicotine dependence: Secondary | ICD-10-CM

## 2019-06-22 LAB — CBC WITH DIFFERENTIAL/PLATELET
Abs Immature Granulocytes: 0.07 10*3/uL (ref 0.00–0.07)
Basophils Absolute: 0 10*3/uL (ref 0.0–0.1)
Basophils Relative: 0 %
Eosinophils Absolute: 0.5 10*3/uL (ref 0.0–0.5)
Eosinophils Relative: 4 %
HCT: 37 % (ref 36.0–46.0)
Hemoglobin: 12 g/dL (ref 12.0–15.0)
Immature Granulocytes: 1 %
Lymphocytes Relative: 10 %
Lymphs Abs: 1.4 10*3/uL (ref 0.7–4.0)
MCH: 29.2 pg (ref 26.0–34.0)
MCHC: 32.4 g/dL (ref 30.0–36.0)
MCV: 90 fL (ref 80.0–100.0)
Monocytes Absolute: 0.7 10*3/uL (ref 0.1–1.0)
Monocytes Relative: 5 %
Neutro Abs: 11.8 10*3/uL — ABNORMAL HIGH (ref 1.7–7.7)
Neutrophils Relative %: 80 %
Platelets: 196 10*3/uL (ref 150–400)
RBC: 4.11 MIL/uL (ref 3.87–5.11)
RDW: 12.9 % (ref 11.5–15.5)
WBC: 14.5 10*3/uL — ABNORMAL HIGH (ref 4.0–10.5)
nRBC: 0 % (ref 0.0–0.2)

## 2019-06-22 LAB — RESPIRATORY PANEL BY RT PCR (FLU A&B, COVID)
Influenza A by PCR: NEGATIVE
Influenza B by PCR: NEGATIVE
SARS Coronavirus 2 by RT PCR: NEGATIVE

## 2019-06-22 LAB — RAPID HIV SCREEN (HIV 1/2 AB+AG)
HIV 1/2 Antibodies: NONREACTIVE
HIV-1 P24 Antigen - HIV24: NONREACTIVE

## 2019-06-22 LAB — HEPATITIS B SURFACE ANTIGEN: Hepatitis B Surface Ag: NONREACTIVE

## 2019-06-22 MED ORDER — WITCH HAZEL-GLYCERIN EX PADS: 1.0000 "application " | MEDICATED_PAD | CUTANEOUS | Status: DC | PRN

## 2019-06-22 MED ORDER — TETANUS-DIPHTH-ACELL PERTUSSIS 5-2.5-18.5 LF-MCG/0.5 IM SUSP: 0.5000 mL | Freq: Once | INTRAMUSCULAR | Status: DC

## 2019-06-22 MED ORDER — ONDANSETRON HCL 4 MG/2ML IJ SOLN: 4.0000 mg | INTRAMUSCULAR | Status: DC | PRN

## 2019-06-22 MED ORDER — ZOLPIDEM TARTRATE 5 MG PO TABS: 5.0000 mg | ORAL_TABLET | Freq: Every evening | ORAL | Status: DC | PRN

## 2019-06-22 MED ORDER — LACTATED RINGERS IV SOLN: INTRAVENOUS | Status: DC

## 2019-06-22 MED ORDER — OXYTOCIN BOLUS FROM INFUSION: 500.0000 mL | Freq: Once | INTRAVENOUS | Status: DC

## 2019-06-22 MED ORDER — ONDANSETRON HCL 4 MG/2ML IJ SOLN: 4.0000 mg | Freq: Four times a day (QID) | INTRAMUSCULAR | Status: DC | PRN

## 2019-06-22 MED ORDER — IBUPROFEN 600 MG PO TABS
600.0000 mg | ORAL_TABLET | Freq: Four times a day (QID) | ORAL | Status: DC
  Administered 2019-06-22 – 2019-06-24 (×9): 600 mg via ORAL
  Filled 2019-06-22 (×8): qty 1

## 2019-06-22 MED ORDER — LIDOCAINE HCL (PF) 1 % IJ SOLN
30.0000 mL | INTRAMUSCULAR | Status: AC | PRN
  Administered 2019-06-22: 30 mL via SUBCUTANEOUS
  Filled 2019-06-22: qty 30

## 2019-06-22 MED ORDER — BENZOCAINE-MENTHOL 20-0.5 % EX AERO
1.0000 "application " | INHALATION_SPRAY | CUTANEOUS | Status: DC | PRN
  Administered 2019-06-24: 1 via TOPICAL
  Filled 2019-06-22: qty 56

## 2019-06-22 MED ORDER — FLEET ENEMA 7-19 GM/118ML RE ENEM: 1.0000 | ENEMA | Freq: Every day | RECTAL | Status: DC | PRN

## 2019-06-22 MED ORDER — SIMETHICONE 80 MG PO CHEW: 80.0000 mg | CHEWABLE_TABLET | ORAL | Status: DC | PRN

## 2019-06-22 MED ORDER — OXYTOCIN 10 UNIT/ML IJ SOLN
INTRAMUSCULAR | Status: AC
  Administered 2019-06-22: 10 [IU]
  Filled 2019-06-22: qty 1

## 2019-06-22 MED ORDER — SENNOSIDES-DOCUSATE SODIUM 8.6-50 MG PO TABS
2.0000 | ORAL_TABLET | ORAL | Status: DC
  Administered 2019-06-22 – 2019-06-24 (×2): 2 via ORAL
  Filled 2019-06-22 (×2): qty 2

## 2019-06-22 MED ORDER — LEVONORGESTREL 19.5 MCG/DAY IU IUD
INTRAUTERINE_SYSTEM | Freq: Once | INTRAUTERINE | Status: AC
  Administered 2019-06-22: 1 via INTRAUTERINE

## 2019-06-22 MED ORDER — LEVONORGESTREL 19.5 MCG/DAY IU IUD: INTRAUTERINE_SYSTEM | Freq: Once | INTRAUTERINE | Status: DC

## 2019-06-22 MED ORDER — COCONUT OIL OIL: 1.0000 "application " | TOPICAL_OIL | Status: DC | PRN

## 2019-06-22 MED ORDER — ACETAMINOPHEN 325 MG PO TABS
650.0000 mg | ORAL_TABLET | ORAL | Status: DC | PRN
  Administered 2019-06-24: 650 mg via ORAL
  Filled 2019-06-22: qty 2

## 2019-06-22 MED ORDER — LACTATED RINGERS IV SOLN: 500.0000 mL | INTRAVENOUS | Status: DC | PRN

## 2019-06-22 MED ORDER — PRENATAL MULTIVITAMIN CH
1.0000 | ORAL_TABLET | Freq: Every day | ORAL | Status: DC
  Administered 2019-06-22 – 2019-06-23 (×2): 1 via ORAL
  Filled 2019-06-22 (×2): qty 1

## 2019-06-22 MED ORDER — LEVONORGESTREL 19.5 MCG/DAY IU IUD
INTRAUTERINE_SYSTEM | INTRAUTERINE | Status: AC
  Filled 2019-06-22: qty 1

## 2019-06-22 MED ORDER — ONDANSETRON HCL 4 MG PO TABS: 4.0000 mg | ORAL_TABLET | ORAL | Status: DC | PRN

## 2019-06-22 MED ORDER — OXYTOCIN 40 UNITS IN NORMAL SALINE INFUSION - SIMPLE MED: 2.5000 [IU]/h | INTRAVENOUS | Status: DC

## 2019-06-22 MED ORDER — DIBUCAINE (PERIANAL) 1 % EX OINT: 1.0000 "application " | TOPICAL_OINTMENT | CUTANEOUS | Status: DC | PRN

## 2019-06-22 MED ORDER — SOD CITRATE-CITRIC ACID 500-334 MG/5ML PO SOLN: 30.0000 mL | ORAL | Status: DC | PRN

## 2019-06-22 MED ORDER — DIPHENHYDRAMINE HCL 25 MG PO CAPS: 25.0000 mg | ORAL_CAPSULE | Freq: Four times a day (QID) | ORAL | Status: DC | PRN

## 2019-06-22 NOTE — Procedures (Signed)
  Post-Placental IUD Insertion Procedure Note  Patient identified, informed consent signed prior to delivery, signed copy in chart, time out was performed.    Vaginal, labial and perineal areas thoroughly inspected for lacerations. 2nd degree laceration identified - not hemostatic, repaired prior to insertion of IUD.  Liletta  - IUD inserted with inserter per manufacturer's instructions.    Strings trimmed to the level of the introitus. Patient tolerated procedure well.  Lot # [id number] Expiration Date 11/2022  Patient given post procedure instructions and IUD care card with expiration date. Patient is asked to keep IUD strings tucked in her vagina until her postpartum follow up visit in 4-6 weeks. Patient advised to abstain from sexual intercourse and pulling on strings before her follow-up visit. Patient verbalized an understanding of the plan of care and agrees.   Merilyn Baba, DO OB Fellow, Faculty Practice 06/22/2019 7:33 AM

## 2019-06-22 NOTE — Progress Notes (Signed)
Pt states she is OK with Celesta Gentile to be a designated visitor but would rather have her friend as her support person so she can visit later this evening.  Pt understands fiancee can only visit 7a-8pm and her support person can spend the night.
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
## 2019-06-22 NOTE — Discharge Instructions (Signed)

## 2019-06-22 NOTE — Discharge Summary (Signed)
Postpartum Discharge Summary       Patient Name: Stephanie Yu DOB: [birth year] MRN: [id number]  Date of admission: 06/22/2019 Delivering Provider: Merilyn Yu   Date of discharge: 06/24/2019  Admitting diagnosis: Normal labor [O80, Z37.9] Intrauterine pregnancy: [redacted]w[redacted]d    Secondary diagnosis:  Active Problems:   Normal labor   Encounter for initial insertion of intrauterine contraceptive device  Additional problems: Delivery at home, No Prenatal Care     Discharge diagnosis: Term Pregnancy Delivered                                                                                                Post partum procedures: post placental IUD  Augmentation: None  Complications: None  Hospital course:  Onset of Labor With Vaginal Delivery     29y.o. yo [id number] [id number] admitted after NSVD at home on 06/22/2019.   She arrived with placenta still undelivered. Placenta delivered in L&D. 2nd degree laceration repaired per delivery note.  Intrapartum Procedures: Episiotomy: None [1]                                         Lacerations:  2nd degree [3];Perineal [11]  Patient had a delivery of a Viable infant. 06/22/2019  Information for the patient's newborn:  PeCacey, Willow0[[id number]]Delivery Method: Vag-Spont     Patient had an uncomplicated postpartum course.  There was concern that she was at higher risk for postpartum depression, given the situation with how her infant may have been conceived- she responded well to Zoloft, and will make a follow up appointment with a behavioral health specialist. She is ambulating, tolerating a regular diet, passing flatus, and urinating well. Patient is discharged home in stable condition on 06/24/19.  Delivery time: 5:30 AM    Magnesium Sulfate received: No BMZ received: No Rhophylac:N/A MMR:N/A Transfusion:No  Physical exam  Vitals:   06/23/19 0503 06/23/19 1350 06/23/19 2310 06/24/19 0621  BP: 127/79 134/81 101/66  111/68  Pulse: (!) 57 (!) 59 88 76  Resp: '20 17 18 16  ' Temp: 97.7 F (36.5 C) 98.1 F (36.7 C) 97.9 F (36.6 C) 98.2 F (36.8 C)  TempSrc: Axillary Axillary Oral Oral  SpO2: 100% 100% 100% 100%   General: alert, cooperative and no distress Lochia: appropriate Uterine Fundus: firm Incision: N/A DVT Evaluation: No evidence of DVT seen on physical exam. Labs: Lab Results  Component Value Date   WBC 8.3 06/23/2019   HGB 10.6 (L) 06/23/2019   HCT 32.6 (L) 06/23/2019   MCV 89.3 06/23/2019   PLT 187 06/23/2019   CMP Latest Ref Rng & Units 06/29/2017  Glucose 65 - 99 mg/dL 117(H)  BUN 6 - 20 mg/dL 11  Creatinine 0.44 - 1.00 mg/dL 0.70  Sodium 135 - 145 mmol/L 141  Potassium 3.5 - 5.1 mmol/L 4.1  Chloride 101 - 111 mmol/L 106  CO2 22 - 32 mmol/L -  Calcium 8.9 - 10.3 mg/dL -  Total Protein  6.5 - 8.1 g/dL -  Total Bilirubin 0.3 - 1.2 mg/dL -  Alkaline Phos 38 - 126 U/L -  AST 15 - 41 U/L -  ALT 14 - 54 U/L -   Edinburgh Score: Edinburgh Postnatal Depression Scale Screening Tool 06/23/2019  I have been able to laugh and see the funny side of things. (No Data)    Discharge instruction: per After Visit Summary and "Baby and Me Booklet".  After visit meds:  Allergies as of 06/24/2019      Reactions   Vicodin [hydrocodone-acetaminophen] Itching, Nausea And Vomiting   Amoxicillin Nausea And Vomiting, Rash, Other (See Comments)   Childhood reaction Has patient had a PCN reaction causing immediate rash, facial/tongue/throat swelling, SOB or lightheadedness with hypotension: Yes Has patient had a PCN reaction causing severe rash involving mucus membranes or skin necrosis: No Has patient had a PCN reaction that required hospitalization: No Has patient had a PCN reaction occurring within the last 10 years: No If all of the above answers are "NO", then may proceed with Cephalosporin use.   Augmentin [amoxicillin-pot Clavulanate] Nausea And Vomiting, Rash, Other (See Comments)   Has  patient had a PCN reaction causing immediate rash, facial/tongue/throat swelling, SOB or lightheadedness with hypotension: yes Has patient had a PCN reaction causing severe rash involving mucus membranes or skin necrosis: no Has patient had a PCN reaction that required hospitalization: no Has patient had a PCN reaction occurring within the last 10 years: no If all of the above answers are "NO", then may proceed with Cephalosporin use.   Penicillins Nausea And Vomiting, Rash, Other (See Comments)   Has patient had a PCN reaction causing immediate rash, facial/tongue/throat swelling, SOB or lightheadedness with hypotension: No Has patient had a PCN reaction causing severe rash involving mucus membranes or skin necrosis: yes Has patient had a PCN reaction that required hospitalization No Has patient had a PCN reaction occurring within the last 10 years: No If all of the above answers are "NO", then may proceed with Cephalosporin use.      Medication List    TAKE these medications   acetaminophen 325 MG tablet Commonly known as: Tylenol Take 2 tablets (650 mg total) by mouth every 4 (four) hours as needed (for pain scale < 4).   albuterol 108 (90 Base) MCG/ACT inhaler Commonly known as: VENTOLIN HFA Inhale 1-2 puffs into the lungs every 6 (six) hours as needed for wheezing or shortness of breath.   alprazolam 2 MG tablet Commonly known as: XANAX Take 2 mg by mouth 2 (two) times daily as needed for anxiety.   amphetamine-dextroamphetamine 20 MG tablet Commonly known as: ADDERALL Take 20 mg by mouth 3 (three) times daily.   ibuprofen 600 MG tablet Commonly known as: ADVIL Take 1 tablet (600 mg total) by mouth every 6 (six) hours.   levETIRAcetam 500 MG 24 hr tablet Commonly known as: KEPPRA XR Take 1 tablet (500 mg total) by mouth daily.   sertraline 25 MG tablet Commonly known as: ZOLOFT Take 1 tablet (25 mg total) by mouth daily.       Diet: routine diet  Activity:  Advance as tolerated. Pelvic rest for 6 weeks.   Outpatient follow up:6 weeks Follow up Appt:No future appointments. Follow up Visit:  Please schedule this patient for Postpartum visit in: 6 weeks with the following provider: Any provider @ [facility] In-Person For C/S patients schedule nurse incision check in weeks 2 weeks: no Low risk pregnancy complicated by: vagianl  delivery at home, no prenatal care Delivery mode:  SVD Anticipated Birth Control:  PP IUD Placed PP Procedures needed: String check  Schedule Integrated [place] visit: yes   Newborn Data: Live born female  Birth Weight: 2886 g  Newborn Delivery   Birth date/time: 06/22/2019 05:30:00 Delivery type: Vaginal, Spontaneous      Baby Feeding: Bottle and Breast Disposition:NICU   06/24/2019 Ysabel Stankovich L Cymone Yeske, DO
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
## 2019-06-22 NOTE — Progress Notes (Signed)
Offered support to patient after the delivery of baby.   [place], [place] Pager, [phone] 5:03 PM    06/22/19 1700  Clinical Encounter Type  Visited With Patient

## 2019-06-22 NOTE — Progress Notes (Signed)
CSW and Chaplain went to speak with [name] at bedside to offer support. [name] requested that CSW walk with  her to the Nursery to see infant. CSW walked with [name] for support. [name] gazed at infant and began to cry as CSW and [name] walked out of Nursery. CSW offered further chart to [name] once arrived back to room. [name] reports feeling of fear, sadness,and "and being a bad person". CSW assured [name] that she has done nothing wrong and advised [name] that CSW would support [name] in anyway possible. [name] thanked CSW.    Virgie Dad Tawan Corkern, MSW, LCSW [facility] [place] [facility] [place] [phone]

## 2019-06-22 NOTE — Progress Notes (Signed)
Patient ID: Stephanie Yu, female   DOB: Jan 11, 1991, 29 y.o.   MRN: [id number]  None     S Ms. Stephanie Yu is a 13 y.o. [phone] non-pregnant female who presents to MAU today with complaint of delivery at home.  Arrived via EMS.   O BP (!) 157/93   Pulse (!) 55   LMP 10/19/2018 (Approximate)  Physical Exam  Constitutional: She is oriented to person, place, and time. She appears well-developed and well-nourished. No distress (crying).  Respiratory: Effort normal.  Genitourinary:    Vaginal discharge (moderate lochia, placenta still in uterus) present.   Neurological: She is alert and oriented to person, place, and time.  Skin: Skin is warm and dry.   Baby is in the care of EMS, wrapped.  Mother does not want to hold him.  He is pink, no respiratory distress.  A Postpartum Day#0 Medical screening exam complete   P Discharge from MAU in stable condition for admission to Labor and Delivery   Seabron Spates, CNM 06/22/2019 7:23 AM

## 2019-06-23 DIAGNOSIS — Z3A37 37 weeks gestation of pregnancy: Secondary | ICD-10-CM

## 2019-06-23 LAB — CBC
HCT: 32.6 % — ABNORMAL LOW (ref 36.0–46.0)
Hemoglobin: 10.6 g/dL — ABNORMAL LOW (ref 12.0–15.0)
MCH: 29 pg (ref 26.0–34.0)
MCHC: 32.5 g/dL (ref 30.0–36.0)
MCV: 89.3 fL (ref 80.0–100.0)
Platelets: 187 10*3/uL (ref 150–400)
RBC: 3.65 MIL/uL — ABNORMAL LOW (ref 3.87–5.11)
RDW: 12.7 % (ref 11.5–15.5)
WBC: 8.3 10*3/uL (ref 4.0–10.5)
nRBC: 0 % (ref 0.0–0.2)

## 2019-06-23 LAB — RUBELLA SCREEN: Rubella: 2.2 index (ref >0.99)

## 2019-06-23 LAB — RPR: RPR Ser Ql: NONREACTIVE

## 2019-06-23 MED ORDER — FERROUS SULFATE 325 (65 FE) MG PO TABS
325.0000 mg | ORAL_TABLET | ORAL | Status: DC
  Administered 2019-06-23: 325 mg via ORAL
  Filled 2019-06-23: qty 1

## 2019-06-23 MED ORDER — SERTRALINE HCL 25 MG PO TABS
25.0000 mg | ORAL_TABLET | Freq: Every day | ORAL | Status: DC
  Administered 2019-06-23 – 2019-06-24 (×2): 25 mg via ORAL
  Filled 2019-06-23 (×2): qty 1
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
## 2019-06-23 NOTE — Lactation Note (Signed)
This note was copied from a baby's chart. Lactation Consultation Note  Patient Name: Stephanie Yu [id number] Date: 06/23/2019 Reason for consult: Initial assessment;Mother's request;NICU baby;Early term 80-38.6wks  1834 - 70 - Ms. Mehra paged for an initial consult. She was originally planning to formula feed baby, but her RN encouraged her to pump and explained to her the benefits of breast milk to babies in the NICU. Ms. Giel reports that has has breast fed and pumped for her previous child (now 3), and she had low milk supply/dried up.   I set up DEBP and helped Ms. Otting initiate pumping. She pumped 18 mls in 15 minutes and was very pleased. I encouraged her to pump every three hours or 8 times a day, including at night. Explained how to disassemble, clean and reassemble equipment.    All questions answered at this time. I praised Ms. Bergdoll for providing baby with her EBM and discussed the benefits of colostrum. Lactation follow up recommended.  Maternal Data Has patient been taught Hand Expression?: Yes Does the patient have breastfeeding experience prior to this delivery?: Yes  Feeding Feeding Type: Formula Nipple Type: Dr. Roosvelt Harps Preemie   Interventions Interventions: Breast feeding basics reviewed;Hand express;DEBP;Hand pump  Lactation Tools Discussed/Used Tools: Pump;Flanges Flange Size: [id number] Breast pump type: Double-Electric Breast Pump;Manual WIC Program: Yes(re-enrollment) Pump Review: Setup, frequency, and cleaning;Milk Storage Initiated by:: hl Date initiated:: 06/23/19   Consult Status Consult Status: Follow-up Date: 06/24/19 Follow-up type: In-patient    Lenore Manner 06/23/2019, 8:27 PM

## 2019-06-24 MED ORDER — SERTRALINE HCL 25 MG PO TABS: 25.0000 mg | ORAL_TABLET | Freq: Every day | ORAL | 0 refills | Status: DC

## 2019-06-24 MED ORDER — IBUPROFEN 600 MG PO TABS: 600.0000 mg | ORAL_TABLET | Freq: Four times a day (QID) | ORAL | 0 refills | Status: DC

## 2019-06-24 MED ORDER — ACETAMINOPHEN 325 MG PO TABS: 650.0000 mg | ORAL_TABLET | ORAL | 0 refills | Status: DC | PRN

## 2019-06-24 NOTE — Progress Notes (Signed)
CSW acknowledged consult(Edin score) and attempted to meet with [name]. However, [name] not in room at time of visit.  CSW will meet with [name] at a later time and/or have NICU CSW follow up.  Desmund Elman D. Lissa Morales, MSW, [facility] Clinical Social Worker [phone]

## 2019-10-15 ENCOUNTER — Ambulatory Visit (INDEPENDENT_AMBULATORY_CARE_PROVIDER_SITE_OTHER): Payer: Medicaid Other

## 2019-10-15 ENCOUNTER — Other Ambulatory Visit: Payer: Self-pay

## 2019-10-15 DIAGNOSIS — Z23 Encounter for immunization: Secondary | ICD-10-CM | POA: Diagnosis not present

## 2019-10-15 NOTE — Progress Notes (Signed)
° °  Covid-19 Vaccination Clinic  Name:  Stephanie Yu    MRN: [id number] DOB: 1990-04-26  10/15/2019  [name] was observed post Covid-19 immunization for 15 minutes without incident. She was provided with Vaccine Information Sheet and instruction to access the V-Safe system.   [name] was instructed to call 911 with any severe reactions post vaccine:  Difficulty breathing   Swelling of face and throat   A fast heartbeat   A bad rash all over body   Dizziness and weakness   Immunizations Administered    Name Date Dose VIS Date Route   Pfizer COVID-19 Vaccine 10/15/2019 11:37 AM 0.3 mL 04/18/2018 Intramuscular   Manufacturer: [employer]   Lot: [id number]   Chance: [address]-3

## 2019-11-06 ENCOUNTER — Ambulatory Visit (INDEPENDENT_AMBULATORY_CARE_PROVIDER_SITE_OTHER): Payer: Medicaid Other

## 2019-11-06 DIAGNOSIS — Z23 Encounter for immunization: Secondary | ICD-10-CM

## 2019-11-10 ENCOUNTER — Ambulatory Visit: Payer: Medicaid Other

## 2019-11-19 ENCOUNTER — Ambulatory Visit: Payer: Self-pay

## 2022-08-27 ENCOUNTER — Inpatient Hospital Stay (HOSPITAL_COMMUNITY)
Admission: EM | Admit: 2022-08-27 | Discharge: 2022-08-30 | DRG: 917 | Discharge status: Against Medical Advice | Payer: Medicaid Other | Attending: Internal Medicine | Admitting: Internal Medicine

## 2022-08-27 ENCOUNTER — Other Ambulatory Visit: Payer: Self-pay

## 2022-08-27 ENCOUNTER — Emergency Department (HOSPITAL_COMMUNITY): Payer: Medicaid Other

## 2022-08-27 ENCOUNTER — Encounter (HOSPITAL_COMMUNITY): Payer: Self-pay | Admitting: Emergency Medicine

## 2022-08-27 DIAGNOSIS — T424X1A Poisoning by benzodiazepines, accidental (unintentional), initial encounter: Secondary | ICD-10-CM | POA: Diagnosis present

## 2022-08-27 DIAGNOSIS — G9340 Encephalopathy, unspecified: Secondary | ICD-10-CM | POA: Diagnosis not present

## 2022-08-27 DIAGNOSIS — R569 Unspecified convulsions: Secondary | ICD-10-CM

## 2022-08-27 DIAGNOSIS — Z880 Allergy status to penicillin: Secondary | ICD-10-CM

## 2022-08-27 DIAGNOSIS — J9601 Acute respiratory failure with hypoxia: Secondary | ICD-10-CM

## 2022-08-27 DIAGNOSIS — B9620 Unspecified Escherichia coli [E. coli] as the cause of diseases classified elsewhere: Secondary | ICD-10-CM | POA: Diagnosis present

## 2022-08-27 DIAGNOSIS — S90511A Abrasion, right ankle, initial encounter: Secondary | ICD-10-CM | POA: Diagnosis present

## 2022-08-27 DIAGNOSIS — Z91199 Patient's noncompliance with other medical treatment and regimen due to unspecified reason: Secondary | ICD-10-CM | POA: Diagnosis not present

## 2022-08-27 DIAGNOSIS — G928 Other toxic encephalopathy: Secondary | ICD-10-CM | POA: Diagnosis present

## 2022-08-27 DIAGNOSIS — Z1623 Resistance to quinolones and fluoroquinolones: Secondary | ICD-10-CM | POA: Diagnosis present

## 2022-08-27 DIAGNOSIS — Z5329 Procedure and treatment not carried out because of patient's decision for other reasons: Secondary | ICD-10-CM | POA: Diagnosis present

## 2022-08-27 DIAGNOSIS — J988 Other specified respiratory disorders: Secondary | ICD-10-CM

## 2022-08-27 DIAGNOSIS — F909 Attention-deficit hyperactivity disorder, unspecified type: Secondary | ICD-10-CM | POA: Diagnosis present

## 2022-08-27 DIAGNOSIS — Z597 Insufficient social insurance and welfare support: Secondary | ICD-10-CM

## 2022-08-27 DIAGNOSIS — Z885 Allergy status to narcotic agent status: Secondary | ICD-10-CM

## 2022-08-27 DIAGNOSIS — E538 Deficiency of other specified B group vitamins: Secondary | ICD-10-CM | POA: Diagnosis present

## 2022-08-27 DIAGNOSIS — G40909 Epilepsy, unspecified, not intractable, without status epilepticus: Secondary | ICD-10-CM | POA: Diagnosis not present

## 2022-08-27 DIAGNOSIS — Z8249 Family history of ischemic heart disease and other diseases of the circulatory system: Secondary | ICD-10-CM

## 2022-08-27 DIAGNOSIS — R4182 Altered mental status, unspecified: Secondary | ICD-10-CM | POA: Diagnosis not present

## 2022-08-27 DIAGNOSIS — Z87891 Personal history of nicotine dependence: Secondary | ICD-10-CM | POA: Diagnosis not present

## 2022-08-27 DIAGNOSIS — Z833 Family history of diabetes mellitus: Secondary | ICD-10-CM

## 2022-08-27 DIAGNOSIS — F32A Depression, unspecified: Secondary | ICD-10-CM | POA: Diagnosis present

## 2022-08-27 DIAGNOSIS — J45909 Unspecified asthma, uncomplicated: Secondary | ICD-10-CM | POA: Diagnosis present

## 2022-08-27 DIAGNOSIS — Z818 Family history of other mental and behavioral disorders: Secondary | ICD-10-CM

## 2022-08-27 DIAGNOSIS — Z560 Unemployment, unspecified: Secondary | ICD-10-CM

## 2022-08-27 DIAGNOSIS — N390 Urinary tract infection, site not specified: Secondary | ICD-10-CM | POA: Diagnosis present

## 2022-08-27 DIAGNOSIS — Z881 Allergy status to other antibiotic agents status: Secondary | ICD-10-CM

## 2022-08-27 DIAGNOSIS — F410 Panic disorder [episodic paroxysmal anxiety] without agoraphobia: Secondary | ICD-10-CM | POA: Diagnosis not present

## 2022-08-27 DIAGNOSIS — Z1611 Resistance to penicillins: Secondary | ICD-10-CM | POA: Diagnosis present

## 2022-08-27 DIAGNOSIS — Z79899 Other long term (current) drug therapy: Secondary | ICD-10-CM

## 2022-08-27 DIAGNOSIS — Z91148 Patient's other noncompliance with medication regimen for other reason: Secondary | ICD-10-CM

## 2022-08-27 LAB — URINALYSIS, ROUTINE W REFLEX MICROSCOPIC
Bilirubin Urine: NEGATIVE
Glucose, UA: NEGATIVE mg/dL
Hgb urine dipstick: NEGATIVE
Ketones, ur: NEGATIVE mg/dL
Leukocytes,Ua: NEGATIVE
Nitrite: POSITIVE — AB
Protein, ur: >=300 mg/dL — AB
Specific Gravity, Urine: 1.022 (ref 1.005–1.030)
pH: 5 (ref 5.0–8.0)

## 2022-08-27 LAB — I-STAT VENOUS BLOOD GAS, ED
Acid-base deficit: 3 mmol/L — ABNORMAL HIGH (ref 0.0–2.0)
Bicarbonate: 25.5 mmol/L (ref 20.0–28.0)
Calcium, Ion: 1.1 mmol/L — ABNORMAL LOW (ref 1.15–1.40)
HCT: 39 % (ref 36.0–46.0)
Hemoglobin: 13.3 g/dL (ref 12.0–15.0)
O2 Saturation: 99 %
Potassium: 4.1 mmol/L (ref 3.5–5.1)
Sodium: 143 mmol/L (ref 135–145)
TCO2: 27 mmol/L (ref 22–32)
pCO2, Ven: 58.4 mmHg (ref 44–60)
pH, Ven: 7.249 — ABNORMAL LOW (ref 7.25–7.43)
pO2, Ven: 156 mmHg — ABNORMAL HIGH (ref 32–45)

## 2022-08-27 LAB — COMPREHENSIVE METABOLIC PANEL
ALT: 20 U/L (ref 0–44)
AST: 23 U/L (ref 15–41)
Albumin: 4 g/dL (ref 3.5–5.0)
Alkaline Phosphatase: 53 U/L (ref 38–126)
Anion gap: 10 (ref 5–15)
BUN: 12 mg/dL (ref 6–20)
CO2: 24 mmol/L (ref 22–32)
Calcium: 8.3 mg/dL — ABNORMAL LOW (ref 8.9–10.3)
Chloride: 106 mmol/L (ref 98–111)
Creatinine, Ser: 0.98 mg/dL (ref 0.44–1.00)
GFR, Estimated: >60 mL/min (ref >60)
Glucose, Bld: 166 mg/dL — ABNORMAL HIGH (ref 70–99)
Potassium: 4 mmol/L (ref 3.5–5.1)
Sodium: 140 mmol/L (ref 135–145)
Total Bilirubin: 0.9 mg/dL (ref 0.3–1.2)
Total Protein: 7.2 g/dL (ref 6.5–8.1)

## 2022-08-27 LAB — CBC WITH DIFFERENTIAL/PLATELET
Abs Immature Granulocytes: 0.06 10*3/uL (ref 0.00–0.07)
Basophils Absolute: 0.1 10*3/uL (ref 0.0–0.1)
Basophils Relative: 0 %
Eosinophils Absolute: 0.2 10*3/uL (ref 0.0–0.5)
Eosinophils Relative: 2 %
HCT: 39.8 % (ref 36.0–46.0)
Hemoglobin: 13 g/dL (ref 12.0–15.0)
Immature Granulocytes: 1 %
Lymphocytes Relative: 25 %
Lymphs Abs: 2.8 10*3/uL (ref 0.7–4.0)
MCH: 30.1 pg (ref 26.0–34.0)
MCHC: 32.7 g/dL (ref 30.0–36.0)
MCV: 92.1 fL (ref 80.0–100.0)
Monocytes Absolute: 0.8 10*3/uL (ref 0.1–1.0)
Monocytes Relative: 7 %
Neutro Abs: 7.3 10*3/uL (ref 1.7–7.7)
Neutrophils Relative %: 65 %
Platelets: 329 10*3/uL (ref 150–400)
RBC: 4.32 MIL/uL (ref 3.87–5.11)
RDW: 13 % (ref 11.5–15.5)
WBC: 11.2 10*3/uL — ABNORMAL HIGH (ref 4.0–10.5)
nRBC: 0 % (ref 0.0–0.2)

## 2022-08-27 LAB — I-STAT CHEM 8, ED
BUN: 13 mg/dL (ref 6–20)
Calcium, Ion: 1.07 mmol/L — ABNORMAL LOW (ref 1.15–1.40)
Chloride: 108 mmol/L (ref 98–111)
Creatinine, Ser: 1 mg/dL (ref 0.44–1.00)
Glucose, Bld: 162 mg/dL — ABNORMAL HIGH (ref 70–99)
HCT: 38 % (ref 36.0–46.0)
Hemoglobin: 12.9 g/dL (ref 12.0–15.0)
Potassium: 4.1 mmol/L (ref 3.5–5.1)
Sodium: 144 mmol/L (ref 135–145)
TCO2: 27 mmol/L (ref 22–32)

## 2022-08-27 LAB — TROPONIN I (HIGH SENSITIVITY): Troponin I (High Sensitivity): 5 ng/L (ref <18)

## 2022-08-27 LAB — RAPID URINE DRUG SCREEN, HOSP PERFORMED
Amphetamines: NOT DETECTED
Barbiturates: NOT DETECTED
Benzodiazepines: POSITIVE — AB
Cocaine: NOT DETECTED
Opiates: NOT DETECTED
Tetrahydrocannabinol: NOT DETECTED

## 2022-08-27 LAB — HCG, SERUM, QUALITATIVE: Preg, Serum: NEGATIVE

## 2022-08-27 LAB — AMMONIA: Ammonia: 30 umol/L (ref 9–35)

## 2022-08-27 LAB — ETHANOL: Alcohol, Ethyl (B): <10 mg/dL (ref <10)

## 2022-08-27 LAB — CK: Total CK: 44 U/L (ref 38–234)

## 2022-08-27 MED ORDER — FENTANYL CITRATE PF 50 MCG/ML IJ SOSY
50.0000 ug | PREFILLED_SYRINGE | INTRAMUSCULAR | Status: AC | PRN
  Administered 2022-08-28 (×3): 50 ug via INTRAVENOUS
  Filled 2022-08-27 (×2): qty 1

## 2022-08-27 MED ORDER — LACTATED RINGERS IV BOLUS
1000.0000 mL | Freq: Once | INTRAVENOUS | Status: AC
  Administered 2022-08-27: 1000 mL via INTRAVENOUS

## 2022-08-27 MED ORDER — DOCUSATE SODIUM 50 MG/5ML PO LIQD
100.0000 mg | Freq: Two times a day (BID) | ORAL | Status: DC
  Administered 2022-08-28: 100 mg
  Filled 2022-08-27: qty 10

## 2022-08-27 MED ORDER — ENOXAPARIN SODIUM 40 MG/0.4ML IJ SOSY
40.0000 mg | PREFILLED_SYRINGE | INTRAMUSCULAR | Status: DC
  Administered 2022-08-28 – 2022-08-30 (×3): 40 mg via SUBCUTANEOUS
  Filled 2022-08-27 (×3): qty 0.4

## 2022-08-27 MED ORDER — PROPOFOL 1000 MG/100ML IV EMUL
0.0000 ug/kg/min | INTRAVENOUS | Status: DC
  Administered 2022-08-28: 10 ug/kg/min via INTRAVENOUS
  Filled 2022-08-27: qty 100

## 2022-08-27 MED ORDER — MIDAZOLAM HCL 2 MG/2ML IJ SOLN
INTRAMUSCULAR | Status: AC
  Filled 2022-08-27: qty 6

## 2022-08-27 MED ORDER — FENTANYL CITRATE PF 50 MCG/ML IJ SOSY
100.0000 ug | PREFILLED_SYRINGE | Freq: Once | INTRAMUSCULAR | Status: AC
  Administered 2022-08-27: 100 ug via INTRAVENOUS

## 2022-08-27 MED ORDER — DOCUSATE SODIUM 100 MG PO CAPS: 100.0000 mg | ORAL_CAPSULE | Freq: Two times a day (BID) | ORAL | Status: DC | PRN

## 2022-08-27 MED ORDER — MIDAZOLAM HCL 5 MG/5ML IJ SOLN
5.0000 mg | Freq: Once | INTRAMUSCULAR | Status: AC
  Administered 2022-08-27: 5 mg via INTRAVENOUS
  Filled 2022-08-27: qty 5

## 2022-08-27 MED ORDER — SODIUM CHLORIDE 0.9 % IV SOLN
2.0000 g | Freq: Every day | INTRAVENOUS | Status: AC
  Administered 2022-08-28 – 2022-08-29 (×3): 2 g via INTRAVENOUS
  Filled 2022-08-27 (×3): qty 20

## 2022-08-27 MED ORDER — FAMOTIDINE 20 MG PO TABS
20.0000 mg | ORAL_TABLET | Freq: Two times a day (BID) | ORAL | Status: DC
  Administered 2022-08-28: 20 mg
  Filled 2022-08-27: qty 1

## 2022-08-27 MED ORDER — FENTANYL CITRATE PF 50 MCG/ML IJ SOSY
PREFILLED_SYRINGE | INTRAMUSCULAR | Status: AC
  Filled 2022-08-27: qty 1

## 2022-08-27 MED ORDER — PROPOFOL 10 MG/ML IV BOLUS
20.0000 mg | Freq: Once | INTRAVENOUS | Status: AC
  Administered 2022-08-27: 20 mg via INTRAVENOUS

## 2022-08-27 MED ORDER — LEVETIRACETAM IN NACL 500 MG/100ML IV SOLN
500.0000 mg | Freq: Two times a day (BID) | INTRAVENOUS | Status: DC
  Administered 2022-08-28: 500 mg via INTRAVENOUS
  Filled 2022-08-27: qty 100

## 2022-08-27 MED ORDER — FENTANYL CITRATE PF 50 MCG/ML IJ SOSY: 50.0000 ug | PREFILLED_SYRINGE | INTRAMUSCULAR | Status: DC | PRN

## 2022-08-27 MED ORDER — PROPOFOL 1000 MG/100ML IV EMUL
5.0000 ug/kg/min | INTRAVENOUS | Status: DC
  Administered 2022-08-27: 10 ug/kg/min via INTRAVENOUS

## 2022-08-27 MED ORDER — ALBUTEROL SULFATE (2.5 MG/3ML) 0.083% IN NEBU: 2.5000 mg | INHALATION_SOLUTION | RESPIRATORY_TRACT | Status: DC | PRN

## 2022-08-27 MED ORDER — PROPOFOL 1000 MG/100ML IV EMUL
INTRAVENOUS | Status: AC
  Filled 2022-08-27: qty 100

## 2022-08-27 MED ORDER — POLYETHYLENE GLYCOL 3350 17 G PO PACK: 17.0000 g | PACK | Freq: Every day | ORAL | Status: DC | PRN

## 2022-08-27 MED ORDER — ACETAMINOPHEN 325 MG PO TABS: 650.0000 mg | ORAL_TABLET | ORAL | Status: DC | PRN

## 2022-08-27 MED ORDER — LEVETIRACETAM IN NACL 1500 MG/100ML IV SOLN
1500.0000 mg | Freq: Once | INTRAVENOUS | Status: AC
  Administered 2022-08-27: 1500 mg via INTRAVENOUS
  Filled 2022-08-27: qty 100

## 2022-08-27 MED ORDER — POLYETHYLENE GLYCOL 3350 17 G PO PACK: 17.0000 g | PACK | Freq: Every day | ORAL | Status: DC
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
## 2022-08-27 NOTE — ED Provider Notes (Signed)
Supervised resident visit.  Patient arrives intubated and obtunded.  History of seizures.  Level 5 caveat due to patient being intubated.  Unable to reach family.  Per EMS and police patient was found in the middle of the the street.  May be seizure activity with EMS and was given Versed and intubated without RSI.  Normal vitals and did not move since being intubated.  She did not have any signs of obvious trauma on exam.  Police thinks that it appeared that she had heroin possibly on her but she was given Narcan in the field.  She arrives here obtunded and intubated.  GCS 3.  Pupils are equal and reactive.  There is no signs of abdominal or chest trauma on exam.  She has a abrasion to the right ankle.  There is no obvious deformity otherwise.  Abdomen is soft and overall benign appearing.  Vital signs are normal.  Patient did begin to get agitated when we attempted to roll her.  She is moving all extremities.  She was sedated with propofol and Versed.  Talked with neurology who recommended starting her on Keppra.  Head CT and neck CT were unremarkable.  Basic labs show no significant anemia or electrolyte abnormality kidney injury or leukocytosis.  Overall suspect you have a status epilepticus or overdose.  Does not seem like there is an infectious process.  She has no leukocytosis or fever.  Ultimately patient admitted to ICU for further care.  I do not think she needs any further traumatic workup at this time.  Hemodynamics have been stable on propofol sedation.  Admitted for further care.  This chart was dictated using voice recognition software.  Despite best efforts to proofread,  errors can occur which can change the documentation meaning.   .Critical Care  Performed by: Virgina Norfolk, DO Authorized by: Virgina Norfolk, DO   Critical care provider statement:    Critical care time (minutes):  35   Critical care was necessary to treat or prevent imminent or life-threatening deterioration of the  following conditions:  CNS failure or compromise   Critical care was time spent personally by me on the following activities:  Blood draw for specimens, development of treatment plan with patient or surrogate, discussions with primary provider, discussions with consultants, evaluation of patient's response to treatment, examination of patient, obtaining history from patient or surrogate, ordering and performing treatments and interventions, ordering and review of laboratory studies, ordering and review of radiographic studies, pulse oximetry, re-evaluation of patient's condition and review of old charts   I assumed direction of critical care for this patient from another provider in my specialty: no       Virgina Norfolk, DO 08/27/22 2313

## 2022-08-27 NOTE — ED Triage Notes (Signed)
  Patient BIB EMS for respiratory distress.  Patient found on side of the road unresponsive and with agonal breathing.  Patient intubated en route.  Patient unresponsive on arrival but vitals stable.  Hx seizures.  No meds given.

## 2022-08-27 NOTE — Consult Note (Addendum)
NEUROLOGY CONSULTATION NOTE   Date of service: August 27, 2022 Patient Name: Stephanie Yu MRN:  [id number] DOB:  Mar 24, 1990 Reason for consult: "?seizure" Requesting Provider: Virgina Norfolk, DO _ _ _   _ __   _ __ _ _  __ __   _ __   __ _  History of Present Illness  Stephanie Yu is a 32 y.o. female with PMH significant for Anxiety, Asthma, Depression, Miscarriage, and Seizures who was found on [address] poorly responsive and pulled to the side by bystanders. EMS called and she was unresponsive, agonal breathing. She was given versed and intubated. Police at bedside report they found a white bag in her belongings while searching for an ID.  She is intubated, trying to sit up in the bed, appears in significant distress and is unable to provide any history at all.  She does not follow any commands.   Chart review shows history of seizures. Saw Dr. Terrace Arabia with [facility] in nov 2017. Was started on Lamictal 100mg  BID back in 2017. rEEG was non revealing. Was seen in ED on 06/29/2017 and at that time, presented with a seizure and was post ictal. Documentation mentions non compliance with AEDs and shewas started on Keppra 500mg  daily. Review of medication fill history shows that she has been consistently getting Adderall from her pharmacy but has not had her keppra refilled in a long time.   ROS   Unable to obtain detailed ROS 2/2 intubation and sedation.  Past History   Past Medical History:  Diagnosis Date   Allergic rhinitis    Anxiety    Asthma    Depression    Miscarriage    Seizures (HCC)    Past Surgical History:  Procedure Laterality Date   DILATION AND CURETTAGE OF UTERUS     DILATION AND EVACUATION N/A 09/08/2015   Procedure: DILATATION AND EVACUATION;  Surgeon: Henrico Bing, MD;  Location: [facility];  Service: Gynecology;  Laterality: N/A;   Family History  Problem Relation Age of Onset   Hypertension Mother    Heart disease Mother    Diabetes Mother     Hypertension Father    Gout Father    Anxiety disorder Father    Lupus Maternal Grandmother    Gout Paternal Grandmother    Seizures Sister    Social History   Socioeconomic History   Marital status: Single    Spouse name: Not on file   Number of children: 0   Years of education: In college now   [facility] education level: Not on file  Occupational History   Occupation: Unemployed  Tobacco Use   Smoking status: Former    Packs/day: 0.25    Years: 10.00    Additional pack years: 0.00    Total pack years: 2.50    Types: Cigarettes   Smokeless tobacco: Never   Tobacco comments:    Quit 10/11/15  Substance and Sexual Activity   Alcohol use: No    Comment: Quit 10/11/15   Drug use: Yes    Types: Marijuana    Comment: last was 11/18/18   Sexual activity: Yes    Birth control/protection: None  Other Topics Concern   Not on file  Social History Narrative   Lives at home alone.   Right-handed.   No caffeine use.       Social Determinants of Health   Financial Resource Strain: Not on file  Food Insecurity: Not on file  Transportation Needs: Not on  file  Physical Activity: Not on file  Stress: Not on file  Social Connections: Not on file   Allergies  Allergen Reactions   Vicodin [Hydrocodone-Acetaminophen] Itching and Nausea And Vomiting   Amoxicillin Nausea And Vomiting, Rash and Other (See Comments)    Childhood reaction Has patient had a PCN reaction causing immediate rash, facial/tongue/throat swelling, SOB or lightheadedness with hypotension: Yes Has patient had a PCN reaction causing severe rash involving mucus membranes or skin necrosis: No Has patient had a PCN reaction that required hospitalization: No Has patient had a PCN reaction occurring within the last 10 years: No If all of the above answers are "NO", then may proceed with Cephalosporin use.   Augmentin [Amoxicillin-Pot Clavulanate] Nausea And Vomiting, Rash and Other (See Comments)    Has patient had a  PCN reaction causing immediate rash, facial/tongue/throat swelling, SOB or lightheadedness with hypotension: yes Has patient had a PCN reaction causing severe rash involving mucus membranes or skin necrosis: no Has patient had a PCN reaction that required hospitalization: no Has patient had a PCN reaction occurring within the last 10 years: no If all of the above answers are "NO", then may proceed with Cephalosporin use.    Penicillins Nausea And Vomiting, Rash and Other (See Comments)    Has patient had a PCN reaction causing immediate rash, facial/tongue/throat swelling, SOB or lightheadedness with hypotension: No Has patient had a PCN reaction causing severe rash involving mucus membranes or skin necrosis: yes Has patient had a PCN reaction that required hospitalization No Has patient had a PCN reaction occurring within the last 10 years: No If all of the above answers are "NO", then may proceed with Cephalosporin use.     Medications  (Not in a hospital admission)    Vitals   Vitals:   08/27/22 2132 08/27/22 2143 08/27/22 2145 08/27/22 2200  BP: (!) 113/58  (!) 140/92 94/61  Pulse: (!) 110  (!) 137 (!) 110  Resp: 18  19 18   Temp: 98.7 F (37.1 C)     TempSrc: Rectal     SpO2: 100%  100% 100%  Weight:  70 kg       Body mass index is 27.34 kg/m.  Physical Exam   General: appears distressed and attempting to sit up while intubated. HENT: Normal oropharynx and mucosa. Normal external appearance of ears and nose.  Neck: Supple, no pain or tenderness  CV: No JVD. No peripheral edema.  Pulmonary: Symmetric Chest rise. Normal respiratory effort.  Abdomen: Soft to touch, non-tender.  Ext: No cyanosis, edema, or deformity  Skin: No rash. Normal palpation of skin.   Musculoskeletal: Normal digits and nails by inspection. No clubbing.   Neurologic Examination  Mental status/Cognition: opens eyes to voice or to vigorous tactile stim. Does not make eye contact. Not  redirectable. Does not answer any questions Speech/language: mute, no speech, no attempts to communicate. Cranial nerves:   CN II Pupils equal and reactive to light,   CN III,IV,VI No gaze preference. Did not attempt dolls eyes as she is in a C collar.   CN V Corneals intact BL   CN VII Symmetric facial grimace.   CN VIII Opens eyes to speech, does not turn head to make eye contact.   CN IX & X Coughing on the ETT   CN XI Spontaneous head movement to both the left and right side.  She is in a c-collar though   CN XII Tongue pushed to the left by  ETT   Sensory/Motor:  Muscle bulk: poor, tone normal Spontaneous antigravity movement in all extremities.  She essentially attempts to sit up in the bed. localizing to proximal pinch in all extremities.  Coordination/Complex Motor:  - Unable to assess.  Labs   CBC:  Recent Labs  Lab 08/27/22 2150 08/27/22 2153  WBC 11.2*  --   NEUTROABS 7.3  --   HGB 13.0 13.3  12.9  HCT 39.8 39.0  38.0  MCV 92.1  --   PLT 329  --     Basic Metabolic Panel:  Lab Results  Component Value Date   NA 143 08/27/2022   NA 144 08/27/2022   K 4.1 08/27/2022   K 4.1 08/27/2022   CO2 24 06/29/2017   GLUCOSE 162 (H) 08/27/2022   BUN 13 08/27/2022   CREATININE 1.00 08/27/2022   CALCIUM 9.6 06/29/2017   GFRNONAA >60 06/29/2017   GFRAA >60 06/29/2017   Lipid Panel: No results found for: "LDLCALC" HgbA1c: No results found for: "HGBA1C" Urine Drug Screen: No results found for: "LABOPIA", "COCAINSCRNUR", "LABBENZ", "AMPHETMU", "THCU", "LABBARB"  Alcohol Level No results found for: "ETH"  CT Head without contrast(Personally reviewed): CTH was negative for a large hypodensity concerning for a large territory infarct or hyperdensity concerning for an ICH  MRI Brain: pending  cEEG:  pending  Impression   Shalisa Preece is a 32 y.o. female with PMH significant for Anxiety, Asthma, Depression, Miscarriage, and Seizures who was found poorly  responsive on the road and intubated in the field. Hx of seizures but likely not been filling her Keppra and also seems to be taking adderall.  Recommendations  - Keppra 1500mg  IV once. - MRI Brain without contrast when able. - LTM EEG - home Keppra at 500mg  BID. - seizure precautions - urine drug screen. - recommend alternative to stimulants(Adderall) specially with hx of seizures. - no driving for 6 months. Has to be seizure free before she can resume driving. ______________________________________________________________________  This patient is critically ill and at significant risk of neurological worsening, death and care requires constant monitoring of vital signs, hemodynamics,respiratory and cardiac monitoring, neurological assessment, discussion with family, other specialists and medical decision making of high complexity. I spent 45 minutes of neurocritical care time  in the care of  this patient. This was time spent independent of any time provided by nurse practitioner or PA.  Erick Blinks [facility] 08/27/2022  11:23 PM  Thank you for the opportunity to take part in the care of this patient. If you have any further questions, please contact the neurology consultation attending.  Signed,  Erick Blinks [facility] _ _ _   _ __   _ __ _ _  __ __   _ __   __ _

## 2022-08-27 NOTE — ED Provider Notes (Signed)
[place] [place] EMERGENCY DEPARTMENT AT [facility] Provider Note   CSN: [id number] Arrival date & time: 08/27/22  2129     History  Chief Complaint  Patient presents with   Respiratory Distress    Stephanie Yu is a 32 y.o. female.  32 year old female reported history of seizures presents unconscious unresponsive via EMS.  She was found lying in the middle of [employer]. bystanders moved to her to the sidewalk and called 911.  The time they arrived she was unconscious, unresponsive GCS 3.  Pupils were reportedly 5 mm.  She exhibited some seizure-like activity with EMS.  They placed an LMA to assist with respirations, and then intubated the patient without any medication.  Initially hypertensive and tachycardic.        Home Medications Prior to Admission medications   Medication Sig Start Date End Date Taking? Authorizing Provider  acetaminophen (TYLENOL) 325 MG tablet Take 2 tablets (650 mg total) by mouth every 4 (four) hours as needed (for pain scale < 4). 06/24/19   Sparacino, Hailey L, DO  albuterol (PROVENTIL HFA;VENTOLIN HFA) 108 (90 Base) MCG/ACT inhaler Inhale 1-2 puffs into the lungs every 6 (six) hours as needed for wheezing or shortness of breath.    [provider]  alprazolam Prudy Feeler) 2 MG tablet Take 2 mg by mouth 2 (two) times daily as needed for anxiety.  06/23/17   [provider]  amphetamine-dextroamphetamine (ADDERALL) 20 MG tablet Take 20 mg by mouth 3 (three) times daily. 06/20/17   [provider]  ibuprofen (ADVIL) 600 MG tablet Take 1 tablet (600 mg total) by mouth every 6 (six) hours. 06/24/19   Sparacino, Hailey L, DO  levETIRAcetam (KEPPRA XR) 500 MG 24 hr tablet Take 1 tablet (500 mg total) by mouth daily. 06/29/17   Arthor Captain, PA-C  sertraline (ZOLOFT) 25 MG tablet Take 1 tablet (25 mg total) by mouth daily. 06/24/19 09/22/19  Sparacino, Hailey L, DO      Allergies    Vicodin [hydrocodone-acetaminophen], Amoxicillin,  Augmentin [amoxicillin-pot clavulanate], and Penicillins    Review of Systems   Review of Systems  Unable to perform ROS: Acuity of condition    Physical Exam Updated Vital Signs BP (!) 113/58 (BP Location: Left Arm)   Pulse (!) 110   Temp 98.7 F (37.1 C) (Rectal)   Resp 18   Wt 70 kg   SpO2 100%   BMI 27.34 kg/m  Physical Exam Vitals and nursing note reviewed.  Constitutional:      Interventions: She is intubated. Cervical collar in place.     Comments: Unconscious, not sedated.  7.0 ETT tube in place 25 at the lips.   HENT:     Head: Normocephalic and atraumatic.     Mouth/Throat:     Mouth: Mucous membranes are moist. Injury present.     Dentition: Abnormal dentition.  Eyes:     General: Lids are everted, no foreign bodies appreciated.     Pupils: Pupils are equal, round, and reactive to light.     Comments: Eyes in disconjugate gaze  Neck:     Trachea: Trachea normal. No tracheal deviation.  Cardiovascular:     Rate and Rhythm: Regular rhythm. Tachycardia present.     Pulses:          Radial pulses are 2+ on the right side and 2+ on the left side.       Dorsalis pedis pulses are 2+ on the right side and 2+ on  the left side.     Heart sounds: Normal heart sounds.  Pulmonary:     Effort: She is intubated.     Breath sounds: Normal breath sounds and air entry. No decreased breath sounds, wheezing, rhonchi or rales.  Chest:     Chest wall: No deformity or crepitus.  Abdominal:     General: Abdomen is flat. Bowel sounds are normal. There are no signs of injury.     Palpations: Abdomen is soft.  Musculoskeletal:     Cervical back: No deformity or signs of trauma.     Thoracic back: No deformity or signs of trauma.     Lumbar back: No deformity or signs of trauma.     Right lower leg: No edema.     Left lower leg: No edema.     Comments: Atraumatic bilateral upper and lower extremities extremities  Skin:    General: Skin is warm and moist.     Capillary Refill:  Capillary refill takes less than 2 seconds.     Coloration: Skin is not cyanotic.     Findings: No abrasion, signs of injury, rash or wound.  Neurological:     Mental Status: She is unresponsive.     GCS: GCS eye subscore is 1. GCS verbal subscore is 1. GCS motor subscore is 1.     Motor: No seizure activity.     Comments: GCS 3 T     ED Results / Procedures / Treatments   Labs (all labs ordered are listed, but only abnormal results are displayed) Labs Reviewed  COMPREHENSIVE METABOLIC PANEL  CBC WITH DIFFERENTIAL/PLATELET  URINALYSIS, ROUTINE W REFLEX MICROSCOPIC  HCG, SERUM, QUALITATIVE  AMMONIA  RAPID URINE DRUG SCREEN, HOSP PERFORMED  ETHANOL  CK  I-STAT VENOUS BLOOD GAS, ED  I-STAT CHEM 8, ED  CBG MONITORING, ED  TROPONIN I (HIGH SENSITIVITY)    EKG EKG Interpretation Date/Time:  Friday August 27 2022 21:30:54 EDT Ventricular Rate:  106 PR Interval:  140 QRS Duration:  75 QT Interval:  326 QTC Calculation: 433 R Axis:   75  Text Interpretation: Sinus tachycardia Confirmed by Virgina Norfolk [phone]) on 08/27/2022 9:50:12 PM  Radiology No results found.  Procedures Procedures    Medications Ordered in ED Medications  levETIRAcetam (KEPPRA) IVPB 1500 mg/ 100 mL premix (has no administration in time range)    ED Course/ Medical Decision Making/ A&P                             Medical Decision Making 32 year old undifferentiated patient found down.  She was intubated in the field without use of any paralytic or sedative.  Arrives intubated, not sedated.  She is unresponsive GCS 3 T.  No obvious external trauma.  Airway confirmed, with bilateral breath sounds and good end-tidal.  Chest x-ray shows ET tube in appropriate position.  Secondary exam without any obvious external trauma.  At the time we rolled the patient over to examine her back, she became alert and agitated and attempted to pull tube.  Patient was given 100 of fentanyl, started on propofol bolus and  drip and then given 5 of Versed.   Given history of seizures, concern for possible seizure versus toxic or metabolic encephalopathy, will plan for broad labs, CT head, C-spine, spoke with neurology who is planning to order an MRI as well as EEG for the patient.  Considered but do not think that she would benefit  from any further trauma imaging at this time.  Patient will need to be admitted to ICU for acute hypoxic respiratory failure.  Patient care handed off to oncoming team for continued care while in the emergency department.  Amount and/or Complexity of Data Reviewed Labs: ordered. Radiology: ordered.  Risk Prescription drug management. Decision regarding hospitalization. Diagnosis or treatment significantly limited by social determinants of health.          Final Clinical Impression(s) / ED Diagnoses Final diagnoses:  None    Rx / DC Orders ED Discharge Orders     None         Fayrene Helper, MD 08/27/22 2316    Virgina Norfolk, DO 08/27/22 2327

## 2022-08-27 NOTE — Progress Notes (Incomplete)
Patient seen and examined, note reviewed with the signed Advanced Practice Provider. Unable to obtain history due to obtunded status. I personally reviewed laboratory data, imaging studies and relevant notes. I independently examined the patient and formulated the important aspects of the plan. Comments or changes to the note/plan are indicated below.   Briefly, 32 year old female with seizures, asthma, anxiety/depression found by bystanders unresponsive with agonal breathing. EMS intubated patient on site and police at bedside reported white powder in her bag. Neuro was consulted for possible seizure given hx since 2017 and documented noncompliance with AEDs.  CT head and neck with no acute abnormalities  WBC 11.2  Possible seizure'' UDS pending Loaded with Keppra followed by BID dosing. LTM EEG ordered. Seizure precautions. Neuro recommending MRI when able.     The patient is critically ill with multiple organ systems failure and requires high complexity decision making for assessment and support, frequent evaluation and titration of therapies, application of advanced monitoring technologies and extensive interpretation of multiple databases.  Independent Critical Care Time: *** Minutes.   Mechele Collin, M.D. [facility] Pulmonary/Critical Care Medicine 08/27/2022 11:54 PM   Please see Amion for pager number to reach on-call Pulmonary and Critical Care Team.

## 2022-08-27 NOTE — H&P (Signed)
NAMEFinnlee Yu, MRN:  [id number], DOB:  August 20, 1990, LOS: 0 ADMISSION DATE:  08/27/2022, CONSULTATION DATE:  7/5 REFERRING MD:  Dr. Lockie Mola, CHIEF COMPLAINT:  Acute encephalopathy   History of Present Illness:  Patient is a 32 yo F w/ pertinent PMH anxiety, depression, seizures presents to [facility] ED on 7/5 unresponsive.  Patient found by bystanders unresponsive. EMS called and found patient unresponsive and agonal breathing. Patient was versed and intubated. Possible white substance found on scene. Patient is on Keppra 500 daily at home but has not had medication refilled in a while. Patient transported to [facility] ED.  Upon arrival patient intubated but MAE but not following commands. Hemodynamically stable. No seizures noted in ED. CT head and c-spine negative for acute abnormality. Neuro consulted. Loaded patient w/ Keppra. EEG and UDS pending. Ethanol, ck, ammonia wnl. UA w/ positive nitrite and bacteria. PCCM consulted for icu admission.  Pertinent  Medical History   Past Medical History:  Diagnosis Date   Allergic rhinitis    Anxiety    Asthma    Depression    Miscarriage    Seizures (HCC)      Significant Hospital Events: Including procedures, antibiotic start and stop dates in addition to other pertinent events   7/5 admitted w/ being unresponsive; intubated; pccm to admit  Interim History / Subjective:  See above  Objective   Blood pressure 94/61, pulse (!) 110, temperature 98.7 F (37.1 C), temperature source Rectal, resp. rate 18, weight 70 kg, SpO2 100 %, unknown if currently breastfeeding.    Vent Mode: PRVC FiO2 (%):  [100 %] 100 % Set Rate:  [18 bmp] 18 bmp Vt Set:  [340 mL] 340 mL PEEP:  [5 cmH20] 5 cmH20 Plateau Pressure:  [8 cmH20] 8 cmH20  No intake or output data in the 24 hours ending 08/27/22 2226 Filed Weights   08/27/22 2143  Weight: 70 kg    Examination: General:   ill appearing young female on mech vent HEENT: MM pink/moist; ETT and c-collar in  place Neuro: sedate on propofol; per nurse moving all extremities but not following commands; perrl CV: s1s2, RRR, no m/r/g PULM:  dim clear BS bilaterally; on mech vent PRVC GI: soft, bsx4 active  Extremities: warm/dry, no edema  Skin: no rashes or lesions    Resolved Hospital Problem list     Assessment & Plan:  Acute encephalopathy Hx of seizures: possible noncompliance w/ home AED -possible white substance found on scene; also on xanax at home -CT head/spine no acute abnormality; ethanol, ammonia wnl Plan: -neuro following; appreciate recs -UDS pending -send salicylate, acetaminophen level -loaded w/ keppra; AEDs per neuro -EEG -seizure precautions -limit sedating meds -continue c-collar for now -MRI per neuro  Acute respiratory failure due to poor GCS Hx of asthma Plan: -LTVV strategy with tidal volumes of 6-8 cc/kg ideal body weight -check ABG and adjust settings accordingly -Wean PEEP/FiO2 for SpO2 >92% -VAP bundle in place -Daily SAT and SBT -PAD protocol in place -wean sedation for RASS goal 0 to -1 -prn albuterol for wheezing  Possible UTI Plan: -urine culture -rocephin  Hx of anxiety/depression ADHD Plan: -hold home meds -will likely need low dose xanax to avoid withdrawal   Best Practice (right click and "Reselect all SmartList Selections" daily)   Diet/type: NPO w/ meds via tube DVT prophylaxis: LMWH GI prophylaxis: H2B Lines: N/A Foley:  N/A Code Status:  full code Last date of multidisciplinary goals of care discussion [7/5 attempted to call  mother Kathie Rhodes but went straight to voicemail.]  Labs   CBC: Recent Labs  Lab 08/27/22 2150 08/27/22 2153  WBC 11.2*  --   NEUTROABS 7.3  --   HGB 13.0 13.3  12.9  HCT 39.8 39.0  38.0  MCV 92.1  --   PLT 329  --     Basic Metabolic Panel: Recent Labs  Lab 08/27/22 2153  NA 143  144  K 4.1  4.1  CL 108  GLUCOSE 162*  BUN 13  CREATININE 1.00   GFR: CrCl cannot be calculated  (Unknown ideal weight.). Recent Labs  Lab 08/27/22 2150  WBC 11.2*    Liver Function Tests: No results for input(s): "AST", "ALT", "ALKPHOS", "BILITOT", "PROT", "ALBUMIN" in the last 168 hours. No results for input(s): "LIPASE", "AMYLASE" in the last 168 hours. No results for input(s): "AMMONIA" in the last 168 hours.  ABG    Component Value Date/Time   HCO3 25.5 08/27/2022 2153   TCO2 27 08/27/2022 2153   TCO2 27 08/27/2022 2153   ACIDBASEDEF 3.0 (H) 08/27/2022 2153   O2SAT 99 08/27/2022 2153     Coagulation Profile: No results for input(s): "INR", "PROTIME" in the last 168 hours.  Cardiac Enzymes: No results for input(s): "CKTOTAL", "CKMB", "CKMBINDEX", "TROPONINI" in the last 168 hours.  HbA1C: No results found for: "HGBA1C"  CBG: No results for input(s): "GLUCAP" in the last 168 hours.  Review of Systems:   Patient is encephalopathic and/or intubated; therefore, history has been obtained from chart review.    Past Medical History:  She,  has a past medical history of Allergic rhinitis, Anxiety, Asthma, Depression, Miscarriage, and Seizures (HCC).   Surgical History:   Past Surgical History:  Procedure Laterality Date   DILATION AND CURETTAGE OF UTERUS     DILATION AND EVACUATION N/A 09/08/2015   Procedure: DILATATION AND EVACUATION;  Surgeon: Charlotte Bing, MD;  Location: [facility];  Service: Gynecology;  Laterality: N/A;     Social History:   reports that she has quit smoking. Her smoking use included cigarettes. She has a 2.50 pack-year smoking history. She has never used smokeless tobacco. She reports current drug use. Drug: Marijuana. She reports that she does not drink alcohol.   Family History:  Her family history includes Anxiety disorder in her father; Diabetes in her mother; Gout in her father and paternal grandmother; Heart disease in her mother; Hypertension in her father and mother; Lupus in her maternal grandmother; Seizures in her sister.    Allergies Allergies  Allergen Reactions   Vicodin [Hydrocodone-Acetaminophen] Itching and Nausea And Vomiting   Amoxicillin Nausea And Vomiting, Rash and Other (See Comments)    Childhood reaction Has patient had a PCN reaction causing immediate rash, facial/tongue/throat swelling, SOB or lightheadedness with hypotension: Yes Has patient had a PCN reaction causing severe rash involving mucus membranes or skin necrosis: No Has patient had a PCN reaction that required hospitalization: No Has patient had a PCN reaction occurring within the last 10 years: No If all of the above answers are "NO", then may proceed with Cephalosporin use.   Augmentin [Amoxicillin-Pot Clavulanate] Nausea And Vomiting, Rash and Other (See Comments)    Has patient had a PCN reaction causing immediate rash, facial/tongue/throat swelling, SOB or lightheadedness with hypotension: yes Has patient had a PCN reaction causing severe rash involving mucus membranes or skin necrosis: no Has patient had a PCN reaction that required hospitalization: no Has patient had a PCN reaction occurring within the last  10 years: no If all of the above answers are "NO", then may proceed with Cephalosporin use.    Penicillins Nausea And Vomiting, Rash and Other (See Comments)    Has patient had a PCN reaction causing immediate rash, facial/tongue/throat swelling, SOB or lightheadedness with hypotension: No Has patient had a PCN reaction causing severe rash involving mucus membranes or skin necrosis: yes Has patient had a PCN reaction that required hospitalization No Has patient had a PCN reaction occurring within the last 10 years: No If all of the above answers are "NO", then may proceed with Cephalosporin use.      Home Medications  Prior to Admission medications   Medication Sig Start Date End Date Taking? Authorizing Provider  acetaminophen (TYLENOL) 325 MG tablet Take 2 tablets (650 mg total) by mouth every 4 (four) hours as  needed (for pain scale < 4). 06/24/19   Sparacino, Hailey L, DO  albuterol (PROVENTIL HFA;VENTOLIN HFA) 108 (90 Base) MCG/ACT inhaler Inhale 1-2 puffs into the lungs every 6 (six) hours as needed for wheezing or shortness of breath.    [provider]  alprazolam Prudy Feeler) 2 MG tablet Take 2 mg by mouth 2 (two) times daily as needed for anxiety.  06/23/17   [provider]  amphetamine-dextroamphetamine (ADDERALL) 20 MG tablet Take 20 mg by mouth 3 (three) times daily. 06/20/17   [provider]  ibuprofen (ADVIL) 600 MG tablet Take 1 tablet (600 mg total) by mouth every 6 (six) hours. 06/24/19   Sparacino, Hailey L, DO  levETIRAcetam (KEPPRA XR) 500 MG 24 hr tablet Take 1 tablet (500 mg total) by mouth daily. 06/29/17   Arthor Captain, PA-C  sertraline (ZOLOFT) 25 MG tablet Take 1 tablet (25 mg total) by mouth daily. 06/24/19 09/22/19  Sparacino, Margarette Asal, DO     Critical care time: 45 minutes    JD Anselm Lis [facility] 08/27/2022, 10:26 PM  Please see [email or web address] for pager details.  From 7A-7P if no response, please call [phone]. After hours, please call ELink [phone].

## 2022-08-28 ENCOUNTER — Inpatient Hospital Stay (HOSPITAL_COMMUNITY): Payer: Medicaid Other

## 2022-08-28 DIAGNOSIS — G9340 Encephalopathy, unspecified: Secondary | ICD-10-CM | POA: Diagnosis not present

## 2022-08-28 DIAGNOSIS — J988 Other specified respiratory disorders: Secondary | ICD-10-CM

## 2022-08-28 DIAGNOSIS — R4182 Altered mental status, unspecified: Secondary | ICD-10-CM

## 2022-08-28 LAB — POCT I-STAT 7, (LYTES, BLD GAS, ICA,H+H)
Acid-Base Excess: 2 mmol/L (ref 0.0–2.0)
Bicarbonate: 28.8 mmol/L — ABNORMAL HIGH (ref 20.0–28.0)
Calcium, Ion: 1.22 mmol/L (ref 1.15–1.40)
HCT: 33 % — ABNORMAL LOW (ref 36.0–46.0)
Hemoglobin: 11.2 g/dL — ABNORMAL LOW (ref 12.0–15.0)
O2 Saturation: 100 %
Patient temperature: 37.2
Potassium: 4 mmol/L (ref 3.5–5.1)
Sodium: 143 mmol/L (ref 135–145)
TCO2: 30 mmol/L (ref 22–32)
pCO2 arterial: 52.9 mmHg — ABNORMAL HIGH (ref 32–48)
pH, Arterial: 7.344 — ABNORMAL LOW (ref 7.35–7.45)
pO2, Arterial: 187 mmHg — ABNORMAL HIGH (ref 83–108)

## 2022-08-28 LAB — BASIC METABOLIC PANEL
Anion gap: 11 (ref 5–15)
BUN: 12 mg/dL (ref 6–20)
CO2: 25 mmol/L (ref 22–32)
Calcium: 8.5 mg/dL — ABNORMAL LOW (ref 8.9–10.3)
Chloride: 105 mmol/L (ref 98–111)
Creatinine, Ser: 1.07 mg/dL — ABNORMAL HIGH (ref 0.44–1.00)
GFR, Estimated: >60 mL/min (ref >60)
Glucose, Bld: 99 mg/dL (ref 70–99)
Potassium: 4.3 mmol/L (ref 3.5–5.1)
Sodium: 141 mmol/L (ref 135–145)

## 2022-08-28 LAB — CBC
HCT: 34.8 % — ABNORMAL LOW (ref 36.0–46.0)
Hemoglobin: 11.4 g/dL — ABNORMAL LOW (ref 12.0–15.0)
MCH: 29.9 pg (ref 26.0–34.0)
MCHC: 32.8 g/dL (ref 30.0–36.0)
MCV: 91.3 fL (ref 80.0–100.0)
Platelets: 236 10*3/uL (ref 150–400)
RBC: 3.81 MIL/uL — ABNORMAL LOW (ref 3.87–5.11)
RDW: 13.2 % (ref 11.5–15.5)
WBC: 13.1 10*3/uL — ABNORMAL HIGH (ref 4.0–10.5)
nRBC: 0 % (ref 0.0–0.2)

## 2022-08-28 LAB — GLUCOSE, CAPILLARY
Glucose-Capillary: 85 mg/dL (ref 70–99)
Glucose-Capillary: 82 mg/dL (ref 70–99)
Glucose-Capillary: 90 mg/dL (ref 70–99)
Glucose-Capillary: 95 mg/dL (ref 70–99)
Glucose-Capillary: 106 mg/dL — ABNORMAL HIGH (ref 70–99)
Glucose-Capillary: 99 mg/dL (ref 70–99)

## 2022-08-28 LAB — MRSA NEXT GEN BY PCR, NASAL: MRSA by PCR Next Gen: DETECTED — AB

## 2022-08-28 LAB — I-STAT ARTERIAL BLOOD GAS, ED
Acid-Base Excess: 0 mmol/L (ref 0.0–2.0)
Bicarbonate: 28.4 mmol/L — ABNORMAL HIGH (ref 20.0–28.0)
Calcium, Ion: 1.21 mmol/L (ref 1.15–1.40)
HCT: 33 % — ABNORMAL LOW (ref 36.0–46.0)
Hemoglobin: 11.2 g/dL — ABNORMAL LOW (ref 12.0–15.0)
O2 Saturation: 100 %
Patient temperature: 36.2
Potassium: 4.7 mmol/L (ref 3.5–5.1)
Sodium: 142 mmol/L (ref 135–145)
TCO2: 30 mmol/L (ref 22–32)
pCO2 arterial: 64.4 mmHg — ABNORMAL HIGH (ref 32–48)
pH, Arterial: 7.248 — ABNORMAL LOW (ref 7.35–7.45)
pO2, Arterial: 527 mmHg — ABNORMAL HIGH (ref 83–108)

## 2022-08-28 LAB — ACETAMINOPHEN LEVEL: Acetaminophen (Tylenol), Serum: <10 ug/mL — ABNORMAL LOW (ref 10–30)

## 2022-08-28 LAB — SALICYLATE LEVEL: Salicylate Lvl: <7 mg/dL — ABNORMAL LOW (ref 7.0–30.0)

## 2022-08-28 LAB — TROPONIN I (HIGH SENSITIVITY): Troponin I (High Sensitivity): 10 ng/L (ref <18)

## 2022-08-28 LAB — HIV ANTIBODY (ROUTINE TESTING W REFLEX): HIV Screen 4th Generation wRfx: NONREACTIVE

## 2022-08-28 LAB — TRIGLYCERIDES: Triglycerides: 88 mg/dL (ref <150)

## 2022-08-28 MED ORDER — FENTANYL 2500MCG IN NS 250ML (10MCG/ML) PREMIX INFUSION
0.0000 ug/h | INTRAVENOUS | Status: DC
  Administered 2022-08-28: 50 ug/h via INTRAVENOUS
  Filled 2022-08-28: qty 250

## 2022-08-28 MED ORDER — LORAZEPAM 2 MG/ML IJ SOLN: 2.0000 mg | INTRAMUSCULAR | Status: DC | PRN

## 2022-08-28 MED ORDER — CHLORHEXIDINE GLUCONATE CLOTH 2 % EX PADS
6.0000 | MEDICATED_PAD | Freq: Every day | CUTANEOUS | Status: DC
  Administered 2022-08-29 – 2022-08-30 (×2): 6 via TOPICAL

## 2022-08-28 MED ORDER — HYDROXYZINE HCL 25 MG PO TABS
25.0000 mg | ORAL_TABLET | Freq: Once | ORAL | Status: AC
  Administered 2022-08-28: 25 mg via ORAL
  Filled 2022-08-28: qty 1

## 2022-08-28 MED ORDER — ORAL CARE MOUTH RINSE: 15.0000 mL | OROMUCOSAL | Status: DC | PRN

## 2022-08-28 MED ORDER — LACOSAMIDE 50 MG PO TABS
50.0000 mg | ORAL_TABLET | Freq: Two times a day (BID) | ORAL | Status: DC
  Administered 2022-08-29 – 2022-08-30 (×3): 50 mg via ORAL
  Filled 2022-08-28 (×3): qty 1

## 2022-08-28 MED ORDER — LORAZEPAM 1 MG PO TABS: 2.0000 mg | ORAL_TABLET | ORAL | Status: DC | PRN

## 2022-08-28 MED ORDER — DIPHENHYDRAMINE HCL 50 MG/ML IJ SOLN
25.0000 mg | Freq: Four times a day (QID) | INTRAMUSCULAR | Status: DC | PRN
  Administered 2022-08-28: 25 mg via INTRAVENOUS
  Filled 2022-08-28: qty 1

## 2022-08-28 MED ORDER — ENSURE ENLIVE PO LIQD
237.0000 mL | Freq: Two times a day (BID) | ORAL | Status: DC
  Administered 2022-08-29: 237 mL via ORAL

## 2022-08-28 MED ORDER — MUPIROCIN 2 % EX OINT
1.0000 | TOPICAL_OINTMENT | Freq: Two times a day (BID) | CUTANEOUS | Status: DC
  Administered 2022-08-28 – 2022-08-30 (×6): 1 via NASAL
  Filled 2022-08-28 (×3): qty 22

## 2022-08-28 MED ORDER — MIDAZOLAM HCL 2 MG/2ML IJ SOLN
1.0000 mg | INTRAMUSCULAR | Status: DC | PRN
  Administered 2022-08-28 (×2): 2 mg via INTRAVENOUS
  Filled 2022-08-28 (×2): qty 2

## 2022-08-28 MED ORDER — ACETAMINOPHEN 325 MG PO TABS: 650.0000 mg | ORAL_TABLET | ORAL | Status: DC | PRN

## 2022-08-28 MED ORDER — SODIUM CHLORIDE 0.9 % IV SOLN
200.0000 mg | Freq: Once | INTRAVENOUS | Status: AC
  Administered 2022-08-28: 200 mg via INTRAVENOUS
  Filled 2022-08-28: qty 20

## 2022-08-28 MED ORDER — ORAL CARE MOUTH RINSE
15.0000 mL | OROMUCOSAL | Status: DC
  Administered 2022-08-28 (×4): 15 mL via OROMUCOSAL

## 2022-08-28 MED ORDER — DIPHENHYDRAMINE HCL 50 MG/ML IJ SOLN
25.0000 mg | Freq: Four times a day (QID) | INTRAMUSCULAR | Status: DC | PRN
  Administered 2022-08-28 – 2022-08-30 (×5): 25 mg via INTRAVENOUS
  Filled 2022-08-28 (×5): qty 1

## 2022-08-28 MED ORDER — GADOBUTROL 1 MMOL/ML IV SOLN
6.0000 mL | Freq: Once | INTRAVENOUS | Status: AC | PRN
  Administered 2022-08-28: 6 mL via INTRAVENOUS

## 2022-08-28 MED ORDER — LORAZEPAM 2 MG/ML IJ SOLN
1.0000 mg | Freq: Once | INTRAMUSCULAR | Status: AC | PRN
  Administered 2022-08-29: 1 mg via INTRAVENOUS
  Filled 2022-08-28: qty 1

## 2022-08-28 NOTE — Progress Notes (Signed)
LTM EEG discontinued - no skin breakdown at unhook.   

## 2022-08-28 NOTE — Progress Notes (Signed)
LTM EEG hooked up and running - no initial skin breakdown - push button tested - [facility] monitoring.  

## 2022-08-28 NOTE — Progress Notes (Signed)
NAMEBryona Yu, MRN:  [id number], DOB:  09/19/1990, LOS: 1 ADMISSION DATE:  08/27/2022, CONSULTATION DATE:  08/27/22 REFERRING MD:  Dr. Lockie Mola , CHIEF COMPLAINT:  Acute encephalopathy   Brief History   Stephanie Yu is a 32 y.o. female admitted to the ICU for altered mental status suspected seizure versus drug overdose. Mental status improving and extubated on 08/28/22. Neurology consulted, and evaluating patient.  Consults:  Neurology  Procedures:  Extubation 08/28/22  Significant Diagnostic Tests:  EEG - Negative  Micro Data:  MRSA nares +  Antimicrobials:  S/p x1 dose of Ceftriaxone   Interim history/subjective:  Awake overnight texting on phone. Able to be extubated this AM. Still in C-collar  Patient state last thing she remembers was walking down street. Does not remember anything else. Denies drug use contributing as she recently left jail. Has not seizure in several years.  Denies numbness or tingling in arms, denies neck pain.   Objective   Blood pressure (!) 89/66, pulse 84, temperature 99.1 F (37.3 C), temperature source Bladder, resp. rate (!) 22, height 5' 2.99" (1.6 m), weight 61.6 kg, SpO2 100 %, unknown if currently breastfeeding.    Vent Mode: PRVC FiO2 (%):  [40 %-100 %] 40 % Set Rate:  [18 bmp-22 bmp] 22 bmp Vt Set:  [340 mL] 340 mL PEEP:  [5 cmH20] 5 cmH20 Plateau Pressure:  [8 cmH20-14 cmH20] 11 cmH20   Intake/Output Summary (Last 24 hours) at 08/28/2022 0818 Last data filed at 08/28/2022 0400 Gross per 24 hour  Intake 1150.31 ml  Output 0 ml  Net 1150.31 ml   Filed Weights   08/27/22 2143 08/28/22 0039 08/28/22 0313  Weight: 70 kg 61.6 kg 61.6 kg    Examination: General: NAD, sitting comfortably in hospital HENT: Moist mucous membranes Lungs: Clear to auscultation bilaterally, crackles or wheezes appreciated Cardiovascular: Regular rate and rhythm, no murmurs Abdomen: Soft nontender no rebound or guarding Extremities: Moving all 4  extremities appropriately, warm well-perfused Neuro: A&O, strength 5 out of 5 bilateral lower, EOMI, no tenderness to palpation of cervical spine, passive ROM of neck elicits no pain Lines: PIV x2, Foley  Resolved Hospital Problem list   AMS Respiratory failure  Assessment & Plan:  Stephanie Yu is 58 y.o. admitted for altered mental status likely due to seizure vs drug overdose. AMS, now resolved.  AMS - GCS now 15, alert oriented. EEG negative, UDS negative. Continue to evaluate with MRI. UDS negative. Hx of Seizures - Has not taken since pregnancy. - Discussed C-collar protocol with Barnetta Chapel PA (Trauma/Gen Surg) - With negative CT cervical, and patient has reassuring neurologic and neck exam today, okay to remove C-collar today - Neurology consulted, recs appreciated - MRI - Keppra q12h - Seizure precautions  Possible UTI - Asymptomatic, would treat if patient symptomatic or culture results  Hx of anxiety/depression ADHD Plan: -hold home meds -will likely need low dose xanax to avoid withdrawal  Best practice:  Diet: Pending SLP eval DVT prophylaxis: LMWH GI prophylaxis: N/A Foley: yes Code Status: Full Disposition: Likely floor today or tomorrow   Labs   CBC: Recent Labs  Lab 08/27/22 2150 08/27/22 2153 08/27/22 2352 08/28/22 0137 08/28/22 0519  WBC 11.2*  --   --  13.1*  --   NEUTROABS 7.3  --   --   --   --   HGB 13.0 13.3  12.9 11.2* 11.4* 11.2*  HCT 39.8 39.0  38.0 33.0* 34.8* 33.0*  MCV 92.1  --   --  91.3  --   PLT 329  --   --  236  --     Basic Metabolic Panel: Recent Labs  Lab 08/27/22 2150 08/27/22 2153 08/27/22 2352 08/28/22 0137 08/28/22 0519  NA 140 143  144 142 141 143  K 4.0 4.1  4.1 4.7 4.3 4.0  CL 106 108  --  105  --   CO2 24  --   --  25  --   GLUCOSE 166* 162*  --  99  --   BUN 12 13  --  12  --   CREATININE 0.98 1.00  --  1.07*  --   CALCIUM 8.3*  --   --  8.5*  --    GFR: Estimated Creatinine Clearance: 61.3  mL/min (A) (by C-G formula based on SCr of 1.07 mg/dL (H)). Recent Labs  Lab 08/27/22 2150 08/28/22 0137  WBC 11.2* 13.1*    Liver Function Tests: Recent Labs  Lab 08/27/22 2150  AST 23  ALT 20  ALKPHOS 53  BILITOT 0.9  PROT 7.2  ALBUMIN 4.0   No results for input(s): "LIPASE", "AMYLASE" in the last 168 hours. Recent Labs  Lab 08/27/22 2150  AMMONIA 30    ABG    Component Value Date/Time   PHART 7.344 (L) 08/28/2022 0519   PCO2ART 52.9 (H) 08/28/2022 0519   PO2ART 187 (H) 08/28/2022 0519   HCO3 28.8 (H) 08/28/2022 0519   TCO2 30 08/28/2022 0519   ACIDBASEDEF 3.0 (H) 08/27/2022 2153   O2SAT 100 08/28/2022 0519     Coagulation Profile: No results for input(s): "INR", "PROTIME" in the last 168 hours.  Cardiac Enzymes: Recent Labs  Lab 08/27/22 2150  CKTOTAL 44    HbA1C: No results found for: "HGBA1C"  CBG: Recent Labs  Lab 08/28/22 0308 08/28/22 0730  GLUCAP 85 82

## 2022-08-28 NOTE — Progress Notes (Signed)
eLink Physician-Brief Progress Note Patient Name: Kalany Shipman DOB: 07-22-1990 MRN: [id number]   Date of Service  08/28/2022  HPI/Events of Note  32 year old female found unresponsive with agonal breathing and intubated in the field.  She has a history of asthma and seizures as well as medical noncompliance.  Admitted to ICU following intubation.  Blood pressure soft but stable.  eICU Interventions  Patient seen.  Awake and following commands.  Texting on her phone. Blood pressure is stable.  Possible extubation later today. Admission reviewed.     Intervention Category Evaluation Type: New Patient Evaluation  Carilyn Goodpasture 08/28/2022, 5:34 AM

## 2022-08-28 NOTE — Procedures (Addendum)
Patient Name: Stephanie Yu  MRN: [id number]  Epilepsy Attending: Charlsie Quest  Referring Physician/Provider: Erick Blinks, MD  Duration: 08/28/2022 0101 to 1515  Patient history: 32 y.o. female with PMH significant for Anxiety, Asthma, Depression, Miscarriage, and Seizures who was found poorly responsive on the road and intubated in the field. Hx of seizures but likely not been filling her Keppra and also seems to be taking adderall. EEG to evaluate for seizure.   Level of alertness: Awake, asleep  AEDs during EEG study: LEV, versed, propofol  Technical aspects: This EEG study was done with scalp electrodes positioned according to the 10-20 International system of electrode placement. Electrical activity was reviewed with band pass filter of 1-70Hz , sensitivity of 7 uV/mm, display speed of 9mm/sec with a 60Hz  notched filter applied as appropriate. EEG data were recorded continuously and digitally stored.  Video monitoring was available and reviewed as appropriate.  Description: The posterior dominant rhythm consists of 8-9 Hz activity of moderate voltage (25-35 uV) seen predominantly in posterior head regions, symmetric and reactive to eye opening and eye closing. Drowsiness was characterized by attenuation of the posterior background rhythm. Sleep was characterized by vertex waves, sleep spindles (12 to 14 Hz), maximal frontocentral region. EEG showed intermittent generalized polymorphic sharply contoured 3 to 6 Hz theta-delta slowing. There is an excessive amount of 15 to 18 Hz beta activity distributed symmetrically and diffusely. Hyperventilation and photic stimulation were not performed.     ABNORMALITY - Intermittent slow, generalized - Excessive beta, generalized  IMPRESSION: This study is suggestive of mild to moderate diffuse encephalopathy, nonspecific etiology. The excessive beta activity seen in the background is most likely due to the effect of benzodiazepine and is a benign  EEG pattern. No seizures or epileptiform discharges were seen throughout the recording.  Stephanie Yu

## 2022-08-28 NOTE — Procedures (Signed)
Extubation Procedure Note  Patient Details:   Name: Stephanie Yu DOB: [birth year] MRN: [id number]   Airway Documentation:    Vent end date: 08/28/22 Vent end time: 0843   Evaluation  O2 sats: stable throughout Complications: No apparent complications Patient did tolerate procedure well. Bilateral Breath Sounds: Clear, Diminished   Yes pt could speak post extubation.  Pt extubated to 2 lm [place] without difficulty.  Audrie Lia 08/28/2022, 8:47 AM

## 2022-08-28 NOTE — Progress Notes (Signed)
Pt transported from Trauma B to 45M09 without complications. Ambu bag present. Report given to 45M RT.

## 2022-08-28 NOTE — Evaluation (Signed)
Clinical/Bedside Swallow Evaluation Stephanie Yu Details  Name: Stephanie Yu MRN: [id number] Date of Birth: 1990/05/27  Today's Date: 08/28/2022 Time: SLP Start Time (ACUTE ONLY): 1249 SLP Stop Time (ACUTE ONLY): 1310 SLP Time Calculation (min) (ACUTE ONLY): 21 min  Past Medical History:  Past Medical History:  Diagnosis Date   Allergic rhinitis    Anxiety    Asthma    Depression    Miscarriage    Seizures (HCC)    Past Surgical History:  Past Surgical History:  Procedure Laterality Date   DILATION AND CURETTAGE OF UTERUS     DILATION AND EVACUATION N/A 09/08/2015   Procedure: DILATATION AND EVACUATION;  Surgeon: Topton Bing, MD;  Location: [facility] ORS;  Service: Gynecology;  Laterality: N/A;   HPI:  Stephanie Yu is a 32 yo F w/ pertinent PMH anxiety, depression, seizures presents to [facility] ED on 7/5 unresponsive.     Stephanie Yu found by bystanders unresponsive. EMS called and found Stephanie Yu unresponsive and agonal breathing. Stephanie Yu given versed and intubated. Possible white substance found on scene. Stephanie Yu is on Keppra 500 daily at home but has not had medication refilled in a while. Stephanie Yu transported to [facility] ED.     Upon arrival Stephanie Yu intubated but MAE but not following commands. Hemodynamically stable. No seizures noted in ED. CT head and c-spine negative for acute abnormality.    Assessment / Plan / Recommendation  Clinical Impression  Pt seen for clinical swallowing evaluation with impaired mastication d/t dental tx needs/pain with mastication on R during solid intake; no overt s/sx of aspiration with thin via tsp/cup and straw sips, puree and/or solids, but pt's vocal quality is hypophonic, hoarse and intermittently breathy during simple conversation.  Pt exhibited a timely swallow, but denotes odynophagia post-extubation.  Recommend initiating a regular/thin liquid diet with general swallowing precautions including slow rate, small bites/sips, and medications provided whole in puree for safety  purposes.  Educated pt on swallow precautions with pt appreciative.  ST will f/u briefly for diet tolerance/education during acute stay.  Thank you for this consult. SLP Visit Diagnosis: Dysphagia, unspecified (R13.10)    Aspiration Risk  Mild aspiration risk    Diet Recommendation   Thin;Age appropriate regular  Medication Administration: Whole meds with puree    Other  Recommendations Oral Care Recommendations: Oral care BID    Recommendations for follow up therapy are one component of a multi-disciplinary discharge planning process, led by the attending physician.  Recommendations may be updated based on Stephanie Yu status, additional functional criteria and insurance authorization.  Follow up Recommendations Follow physician's recommendations for discharge plan and follow up therapies      Assistance Recommended at Discharge  TBD  Functional Status Assessment Stephanie Yu has had a recent decline in their functional status and demonstrates the ability to make significant improvements in function in a reasonable and predictable amount of time.  Frequency and Duration min 2x/week  1 week       Prognosis Prognosis for improved oropharyngeal function: Good      Swallow Study   General Date of Onset: 08/27/22 HPI: Stephanie Yu is a 32 yo F w/ pertinent PMH anxiety, depression, seizures presents to [facility] ED on 7/5 unresponsive.     Stephanie Yu found by bystanders unresponsive. EMS called and found Stephanie Yu unresponsive and agonal breathing. Stephanie Yu was versed and intubated. Possible white substance found on scene. Stephanie Yu is on Keppra 500 daily at home but has not had medication refilled in a while. Stephanie Yu transported to [facility] ED.  Upon arrival Stephanie Yu intubated but MAE but not following commands. Hemodynamically stable. No seizures noted in ED. CT head and c-spine negative for acute abnormality. Type of Study: Bedside Swallow Evaluation Previous Swallow Assessment: n/a Diet Prior to this Study:  NPO Temperature Spikes Noted: Yes (99 low grade) Respiratory Status: Room air History of Recent Intubation: Yes Total duration of intubation (days): 1 days Date extubated: 08/28/22 Behavior/Cognition: Alert;Cooperative Oral Cavity Assessment: Dry Oral Care Completed by SLP: Recent completion by staff Oral Cavity - Dentition: Adequate natural dentition;Missing dentition;Other (Comment) (pt denotes needing dental care on R) Vision: Functional for self-feeding Self-Feeding Abilities: Able to feed self Stephanie Yu Positioning: Upright in bed Baseline Vocal Quality: Breathy;Hoarse;Low vocal intensity Volitional Cough: Strong Volitional Swallow: Able to elicit    Oral/Motor/Sensory Function Overall Oral Motor/Sensory Function: Within functional limits   Ice Chips Ice chips: Within functional limits Presentation: Spoon Other Comments: odynophagia (pain with swallowing, edema could contribute)   Thin Liquid Thin Liquid: Within functional limits Presentation: Cup;Spoon;Straw;Self Fed Other Comments: small sips only    Nectar Thick Nectar Thick Liquid: Not tested   Honey Thick Honey Thick Liquid: Not tested   Puree Puree: Within functional limits Presentation: Self Fed   Solid     Solid: Impaired Presentation: Self Fed Oral Phase Impairments: Impaired mastication Oral Phase Functional Implications: Impaired mastication      Pat Alandria Butkiewicz,M.S., CCC-SLP 08/28/2022,1:23 PM

## 2022-08-28 NOTE — Progress Notes (Signed)
Patient has arrived to [place]. EEG to be placed once she is ready. Tech on standby.

## 2022-08-28 NOTE — Progress Notes (Addendum)
Subjective: Recently extubated, hoarse voice.  Alert and oriented, follows all commands. No seizures seen on overnight EEG..  Patient states she stopped taking her Keppra around 5 years ago when she had her daughter has not taken it since.  She has not had a seizure since then and would prefer to not have to take the Keppra.  Exam: Vitals:   08/28/22 0630 08/28/22 0732  BP: (!) 89/66   Pulse: 84   Resp: (!) 22   Temp: 99.3 F (37.4 C) 99.1 F (37.3 C)  SpO2: 100%    Gen: In bed, NAD Resp: non-labored breathing, no acute distress Abd: soft, nt  Neuro: MS: Alert and oriented to person, place, month, year.   does not recall coming into hospital MV:HQION, EOMI, visual fields full (glasses at baseline), facial movement and sensation bilaterally intact, hearing intact to voice, head turn and shoulder shrug symmetric, midline tongue Motor: Generalized weakness, 4+/5 in all extremities. Sensory: Symmetric throughout  Pertinent Labs: UDS: + for Benzos (was intubated prior to test) UA: Positive WBC: 11.2   LTM EEG 7/6 read: Suggestive of mild to moderate diffuse encephalopathy, nonspecific etiology. The excessive beta activity seen in the background is most likely due to the effect of benzodiazepine and is a benign EEG pattern. No seizures or epileptiform discharges were seen throughout the recording   MRI brain wwo 7/6 - IV infiltrated so no contrast was administered (noncon images were normal, personally reviewed by attending  Impression: 32 y.o. female with PMH significant for Anxiety, Asthma, Depression, Miscarriage, and Seizures, noncompliance with AEDs, on Adderall who was found on [address] poorly responsive, intubated on scene. She did not have side effects on the keppra but I am concerned it will worsen her depression. She is amenable to starting vimpat.  Recommendations: - MRI brain with contrast - Seizure Precautions - D/c keppra - Vimpat 200mg  once f/b 50mg  bid. This  should be prescribed generic at discharge and coupon printed from [facility] to obtain it (usually $12/mo) if patient does not have insurance - D/c LTM - Will continue to follow  Note by Mauri Reading NP and edited by MD   Attending Neurohospitalist Addendum Patient seen and examined with APP/Resident. Agree with the history and physical as documented above. Agree with the plan as documented, which I helped formulate. I have edited the note above to reflect my full findings and recommendations. I have independently reviewed the chart, obtained history, review of systems and examined the patient.I have personally reviewed pertinent head/neck/spine imaging (CT/MRI). Please feel free to call with any questions.  -- Bing Neighbors, MD [facility] [phone]  If 7pm- 7am, please page neurology on call as listed in AMION.

## 2022-08-29 ENCOUNTER — Inpatient Hospital Stay (HOSPITAL_COMMUNITY): Payer: Medicaid Other

## 2022-08-29 DIAGNOSIS — G9340 Encephalopathy, unspecified: Secondary | ICD-10-CM | POA: Diagnosis not present

## 2022-08-29 LAB — CBC
HCT: 32.8 % — ABNORMAL LOW (ref 36.0–46.0)
Hemoglobin: 10.8 g/dL — ABNORMAL LOW (ref 12.0–15.0)
MCH: 29.7 pg (ref 26.0–34.0)
MCHC: 32.9 g/dL (ref 30.0–36.0)
MCV: 90.1 fL (ref 80.0–100.0)
Platelets: 222 10*3/uL (ref 150–400)
RBC: 3.64 MIL/uL — ABNORMAL LOW (ref 3.87–5.11)
RDW: 13.2 % (ref 11.5–15.5)
WBC: 7.9 10*3/uL (ref 4.0–10.5)
nRBC: 0 % (ref 0.0–0.2)

## 2022-08-29 LAB — BASIC METABOLIC PANEL
Anion gap: 13 (ref 5–15)
BUN: 10 mg/dL (ref 6–20)
CO2: 28 mmol/L (ref 22–32)
Calcium: 8.7 mg/dL — ABNORMAL LOW (ref 8.9–10.3)
Chloride: 102 mmol/L (ref 98–111)
Creatinine, Ser: 0.72 mg/dL (ref 0.44–1.00)
GFR, Estimated: >60 mL/min (ref >60)
Glucose, Bld: 94 mg/dL (ref 70–99)
Potassium: 3.8 mmol/L (ref 3.5–5.1)
Sodium: 143 mmol/L (ref 135–145)

## 2022-08-29 LAB — URINE CULTURE: Culture: 50000 — AB

## 2022-08-29 LAB — GLUCOSE, CAPILLARY
Glucose-Capillary: 91 mg/dL (ref 70–99)
Glucose-Capillary: 101 mg/dL — ABNORMAL HIGH (ref 70–99)

## 2022-08-29 MED ORDER — ALPRAZOLAM 0.5 MG PO TABS
2.0000 mg | ORAL_TABLET | Freq: Two times a day (BID) | ORAL | Status: DC | PRN
  Administered 2022-08-29 – 2022-08-30 (×3): 2 mg via ORAL
  Filled 2022-08-29 (×3): qty 4

## 2022-08-29 MED ORDER — GADOBUTROL 1 MMOL/ML IV SOLN
6.0000 mL | Freq: Once | INTRAVENOUS | Status: AC | PRN
  Administered 2022-08-29: 6 mL via INTRAVENOUS

## 2022-08-29 MED ORDER — ALPRAZOLAM 0.5 MG PO TABS
0.5000 mg | ORAL_TABLET | Freq: Once | ORAL | Status: AC
  Administered 2022-08-29: 0.5 mg via ORAL
  Filled 2022-08-29: qty 1

## 2022-08-29 MED ORDER — FLUOXETINE HCL 20 MG PO CAPS
20.0000 mg | ORAL_CAPSULE | Freq: Every day | ORAL | Status: DC
  Administered 2022-08-29 – 2022-08-30 (×2): 20 mg via ORAL
  Filled 2022-08-29 (×3): qty 1

## 2022-08-29 MED ORDER — HYDROXYZINE HCL 25 MG PO TABS
50.0000 mg | ORAL_TABLET | Freq: Three times a day (TID) | ORAL | Status: DC | PRN
  Filled 2022-08-29: qty 2

## 2022-08-29 NOTE — Progress Notes (Signed)
eLink Physician-Brief Progress Note Patient Name: Stephanie Yu DOB: [birth year] MRN: [id number]   Date of Service  08/29/2022  HPI/Events of Note  RN called to say patient is having a panic attack. Seen on camera. Crying and very anxious. Rn notes she said she is very scared of closed spaces and just had her MRI done. She was given 1 mg IV ativan before it, just before 4 am. She takes Xanax at home and feels her xanax would help. Notes mention plan to start Xanax as well soon.   eICU Interventions  Low dose Xanax x 1      Intervention Category Major Interventions: Change in mental status - evaluation and management  Oretha Milch 08/29/2022, 4:33 AM

## 2022-08-29 NOTE — Progress Notes (Signed)
Patient refused telemetry orders verbalized " I don't want that". Pt was educated.

## 2022-08-29 NOTE — Progress Notes (Signed)
NAMEAndreanna Yu, MRN:  [id number], DOB:  08-03-1990, LOS: 2 ADMISSION DATE:  08/27/2022, CONSULTATION DATE:  08/27/22 REFERRING MD:  Dr. Lockie Mola , CHIEF COMPLAINT:  Acute encephalopathy   Brief History   32 yo female found on the side of the road with altered mental status and agonal breathing.  Police found a bag with white substance in her belongings.  Presumed to have accidental overdose, and possible seizure.  Intubated for airway protection and PCCM asked to admit to ICU.  Past Medical History  Allergies, Anxiety, Asthma, Depression, Seizures  Significant Events  7/05 Admit, Intubated, neuro consulted 7/06 extubated, d/c LTM 7/07 transfer to med-tele  Interim history/subjective:  Feels anxious.  Had itching on her back - better now.  Objective   Blood pressure 116/82, pulse (!) 102, temperature 98.1 F (36.7 C), temperature source Oral, resp. rate 14, height 5' 2.99" (1.6 m), weight 62.3 kg, SpO2 97 %, unknown if currently breastfeeding.    Vent Mode: PRVC FiO2 (%):  [28 %-40 %] 28 % Set Rate:  [22 bmp] 22 bmp Vt Set:  [340 mL] 340 mL PEEP:  [5 cmH20] 5 cmH20 Plateau Pressure:  [11 cmH20] 11 cmH20   Intake/Output Summary (Last 24 hours) at 08/29/2022 0752 Last data filed at 08/29/2022 0350 Gross per 24 hour  Intake 672.68 ml  Output 1600 ml  Net -927.32 ml   Filed Weights   08/28/22 0039 08/28/22 0313 08/29/22 0500  Weight: 61.6 kg 61.6 kg 62.3 kg    Examination:  General - alert Eyes - pupils reactive ENT - no sinus tenderness, no stridor Cardiac - regular rate/rhythm, no murmur Chest - equal breath sounds b/l, no wheezing or rales Abdomen - soft, non tender, + bowel sounds Extremities - no cyanosis, clubbing, or edema Skin - no rashes Neuro - normal strength, moves extremities, follows commands Psych - normal mood and behavior  Assessment & Plan:   Acute toxic and metabolic encephalopathy from accidental overdose and UTI. Hx of seizures with report of  medication non-compliance. Hx of ADHD, anxiety and depression. - f/u MRI brain results from 7/07 - neuro consulted - continue vimpat - resume prozac and prn xanax - hold outpt adderall for now    Compromised airway. - successful extubated 7/06 - bronchial hygiene   UTI. - complete ABx on 7/07  Anemia. - check iron level - f/u CBC   Hx of asthma. - prn albuterol  Transfer to medical tele on 7/07.  Will ask [facility] to assume care from 7/08 and PCCM sign off.  Labs       Latest Ref Rng & Units 08/29/2022    2:32 AM 08/28/2022    5:19 AM 08/28/2022    1:37 AM  CMP  Glucose 70 - 99 mg/dL 94   99   BUN 6 - 20 mg/dL 10   12   Creatinine 4.09 - 1.00 mg/dL 8.11   9.14   Sodium 782 - 145 mmol/L 143  143  141   Potassium 3.5 - 5.1 mmol/L 3.8  4.0  4.3   Chloride 98 - 111 mmol/L 102   105   CO2 22 - 32 mmol/L 28   25   Calcium 8.9 - 10.3 mg/dL 8.7   8.5       Latest Ref Rng & Units 08/29/2022    2:32 AM 08/28/2022    5:19 AM 08/28/2022    1:37 AM  CBC  WBC 4.0 - 10.5 K/uL 7.9  13.1   Hemoglobin 12.0 - 15.0 g/dL 16.1  09.6  04.5   Hematocrit 36.0 - 46.0 % 32.8  33.0  34.8   Platelets 150 - 400 K/uL 222   236     ABG    Component Value Date/Time   PHART 7.344 (L) 08/28/2022 0519   PCO2ART 52.9 (H) 08/28/2022 0519   PO2ART 187 (H) 08/28/2022 0519   HCO3 28.8 (H) 08/28/2022 0519   TCO2 30 08/28/2022 0519   ACIDBASEDEF 3.0 (H) 08/27/2022 2153   O2SAT 100 08/28/2022 0519    CBG (last 3)  Recent Labs    08/28/22 2314 08/29/22 0327 08/29/22 0731  GLUCAP 99 91 101*   Signature:  Coralyn Helling, MD [facility] Pager - [phone] or [phone] 08/29/2022, 7:59 AM

## 2022-08-29 NOTE — Progress Notes (Signed)
Pt educated on telemetry, and agreed to telemetry monitoring when she requested to not be woken up for vitals. Pt is irritable for not being allowed to go outside with her friend alone, and mentioned leaving AMA because she was tired of being here. I discussed with her about the importance of her treatment here and AMA.

## 2022-08-30 ENCOUNTER — Inpatient Hospital Stay (HOSPITAL_COMMUNITY): Payer: Medicaid Other

## 2022-08-30 ENCOUNTER — Other Ambulatory Visit (HOSPITAL_COMMUNITY): Payer: Self-pay

## 2022-08-30 DIAGNOSIS — J9601 Acute respiratory failure with hypoxia: Secondary | ICD-10-CM

## 2022-08-30 DIAGNOSIS — G9340 Encephalopathy, unspecified: Secondary | ICD-10-CM | POA: Diagnosis not present

## 2022-08-30 DIAGNOSIS — G40909 Epilepsy, unspecified, not intractable, without status epilepticus: Secondary | ICD-10-CM | POA: Diagnosis not present

## 2022-08-30 LAB — CBC
HCT: 39.3 % (ref 36.0–46.0)
HCT: 40.2 % (ref 36.0–46.0)
Hemoglobin: 12.9 g/dL (ref 12.0–15.0)
Hemoglobin: 13.6 g/dL (ref 12.0–15.0)
MCH: 29.7 pg (ref 26.0–34.0)
MCH: 31.3 pg (ref 26.0–34.0)
MCHC: 32.8 g/dL (ref 30.0–36.0)
MCHC: 33.8 g/dL (ref 30.0–36.0)
MCV: 90.6 fL (ref 80.0–100.0)
MCV: 92.6 fL (ref 80.0–100.0)
Platelets: 261 10*3/uL (ref 150–400)
Platelets: 283 10*3/uL (ref 150–400)
RBC: 4.34 MIL/uL (ref 3.87–5.11)
RBC: 4.34 MIL/uL (ref 3.87–5.11)
RDW: 13.1 % (ref 11.5–15.5)
RDW: 13.2 % (ref 11.5–15.5)
WBC: 7.1 10*3/uL (ref 4.0–10.5)
WBC: 7.6 10*3/uL (ref 4.0–10.5)
nRBC: 0 % (ref 0.0–0.2)
nRBC: 0 % (ref 0.0–0.2)

## 2022-08-30 LAB — VITAMIN B12: Vitamin B-12: 210 pg/mL (ref 180–914)

## 2022-08-30 LAB — RETICULOCYTES
Immature Retic Fract: 6.6 % (ref 2.3–15.9)
RBC.: 4.3 MIL/uL (ref 3.87–5.11)
Retic Count, Absolute: 46.4 10*3/uL (ref 19.0–186.0)
Retic Ct Pct: 1.1 % (ref 0.4–3.1)

## 2022-08-30 LAB — IRON AND TIBC
Iron: 66 ug/dL (ref 28–170)
Saturation Ratios: 23 % (ref 10.4–31.8)
TIBC: 293 ug/dL (ref 250–450)
UIBC: 227 ug/dL

## 2022-08-30 LAB — COMPREHENSIVE METABOLIC PANEL
ALT: 152 U/L — ABNORMAL HIGH (ref 0–44)
AST: 151 U/L — ABNORMAL HIGH (ref 15–41)
Albumin: 3.7 g/dL (ref 3.5–5.0)
Alkaline Phosphatase: 103 U/L (ref 38–126)
Anion gap: 10 (ref 5–15)
BUN: 16 mg/dL (ref 6–20)
CO2: 25 mmol/L (ref 22–32)
Calcium: 8.5 mg/dL — ABNORMAL LOW (ref 8.9–10.3)
Chloride: 104 mmol/L (ref 98–111)
Creatinine, Ser: 0.93 mg/dL (ref 0.44–1.00)
GFR, Estimated: >60 mL/min (ref >60)
Glucose, Bld: 203 mg/dL — ABNORMAL HIGH (ref 70–99)
Potassium: 3.9 mmol/L (ref 3.5–5.1)
Sodium: 139 mmol/L (ref 135–145)
Total Bilirubin: 0.4 mg/dL (ref 0.3–1.2)
Total Protein: 6.7 g/dL (ref 6.5–8.1)

## 2022-08-30 LAB — FERRITIN: Ferritin: 113 ng/mL (ref 11–307)

## 2022-08-30 LAB — ECHOCARDIOGRAM COMPLETE
Area-P 1/2: 4.06 cm2
Height: 62.992 in
S' Lateral: 2.8 cm
Weight: 2197.55 oz

## 2022-08-30 LAB — FOLATE: Folate: 12.7 ng/mL (ref >5.9)

## 2022-08-30 LAB — URINE CULTURE

## 2022-08-30 LAB — GLUCOSE, CAPILLARY: Glucose-Capillary: 77 mg/dL (ref 70–99)

## 2022-08-30 MED ORDER — NALOXONE HCL 0.4 MG/ML IJ SOLN
INTRAMUSCULAR | Status: AC
  Filled 2022-08-30: qty 1

## 2022-08-30 MED ORDER — ALPRAZOLAM 1 MG PO TABS
2.0000 mg | ORAL_TABLET | Freq: Two times a day (BID) | ORAL | 0 refills | Status: AC | PRN
  Filled 2022-08-30: qty 55, 14d supply, fill #0
  Filled 2022-08-30: qty 5, 1d supply, fill #0

## 2022-08-30 MED ORDER — LACOSAMIDE 50 MG PO TABS
50.0000 mg | ORAL_TABLET | Freq: Two times a day (BID) | ORAL | 2 refills | Status: AC
  Filled 2022-08-30: qty 60, 30d supply, fill #0

## 2022-08-30 MED ORDER — PROPRANOLOL HCL 10 MG PO TABS
10.0000 mg | ORAL_TABLET | Freq: Two times a day (BID) | ORAL | Status: DC
  Administered 2022-08-30: 10 mg via ORAL
  Filled 2022-08-30 (×3): qty 1

## 2022-08-30 MED ORDER — IOHEXOL 350 MG/ML SOLN
75.0000 mL | Freq: Once | INTRAVENOUS | Status: AC | PRN
  Administered 2022-08-30: 75 mL via INTRAVENOUS

## 2022-08-30 MED ORDER — DIPHENHYDRAMINE HCL 25 MG PO CAPS
25.0000 mg | ORAL_CAPSULE | Freq: Four times a day (QID) | ORAL | Status: DC | PRN
  Administered 2022-08-30: 25 mg via ORAL
  Filled 2022-08-30: qty 1

## 2022-08-30 MED ORDER — FLUOXETINE HCL 20 MG PO CAPS
20.0000 mg | ORAL_CAPSULE | Freq: Every day | ORAL | 0 refills | Status: AC
  Filled 2022-08-30: qty 30, 30d supply, fill #0

## 2022-08-30 MED ORDER — PROPRANOLOL HCL 10 MG PO TABS
10.0000 mg | ORAL_TABLET | Freq: Two times a day (BID) | ORAL | 1 refills | Status: AC
  Filled 2022-08-30: qty 60, 30d supply, fill #0

## 2022-08-30 MED ORDER — VITAMIN B-12 1000 MCG PO TABS
1000.0000 ug | ORAL_TABLET | Freq: Every day | ORAL | 2 refills | Status: AC
  Filled 2022-08-30: qty 30, 30d supply, fill #0

## 2022-08-30 MED ORDER — NALOXONE HCL 0.4 MG/ML IJ SOLN
0.4000 mg | INTRAMUSCULAR | Status: DC | PRN
  Administered 2022-08-30: 0.4 mg via INTRAVENOUS

## 2022-08-30 NOTE — Progress Notes (Signed)
IV team RN x 2 arrived to Code blue. No new access needed at this time

## 2022-08-30 NOTE — Progress Notes (Addendum)
Subjective: Patient is a 32 female with a history of anxiety, mood, depression, miscarriage, and seizures.  She was reportedly responsive on July for which EMS was called.  She had agonal breathing and was intubated/sedated.  White substance was found when the police were searching her belongings  She had previously been seen at [facility] neurology in November 2017 at which time she was started on Lamictal and her EEG was unrevealing.  More recently, she was seen in May 2019 in the emergency room but had not been compliant with her antiepileptic medication.  It was at that time that she was started on Keppra 500 mg daily.  She takes Adderall but has not been filling her Keppra per review of medications.  Long-term EEG on 7/6 excessive beta, likely secondary to benzodiazepine use, and slowing mild to moderate in severity.  MRI not able to be obtained. We saw her over the weekend and it was recommended that she be started on Vimpat since depression is an issue and Keppra could worsen this. -------- Neurology was reengaged on 08/30/2022 after CODE BLUE was called on the patient this morning.  Documentation denotes that the patient lost consciousness and was apneic without any palpable pulse. Compressions initiated but she regained consciousness soon after and they were discontinued.  On telemetry, there is no loss of pulse and there was no witnessed seizure activity.  She is now alert and oriented x 3 but states she does not recall events, only that she was sitting upright prior to them.  Urine drug screen at time of admission was only positive for benzodiazepines, although the drug screen used here at [facility] is not sensitive for fentanyl or methadone.  There is another rapid urine drug screen currently pending.  Urine culture with E. Coli bacteruria. AST/ALT elevated in 150s.   Patient states she has not been on anti seizure medications since 5 years ago. She may have had a single seizure in this time frame prior  to this admission, per patient. Reason fort cessation was pregnancy and perceived risk of harm to fetus. Her daughter is now 67 years old. Upon chart review, Lamictal was recommended since deemed relatively safe during pregnancy, but patient has not taken this.  She did try gabapentin in past but discontinued in setting of fatigue.   Exam: Vitals:   08/30/22 1143 08/30/22 1200  BP: (!) 133/102 120/87  Pulse: (!) 114   Resp: 19   Temp:  (!) 97.5 F (36.4 C)  SpO2: 100%    Gen: In bed, NAD Resp: non-labored breathing, no acute distress Abd: soft, nt  Neuro: Ment: Alert and oriented to person, place, month, year.  does not recall coming into hospital CN: PERRL, EOMI, visual fields full (glasses at baseline), facial movement and sensation bilaterally intact, hearing intact to voice, head turn and shoulder shrug symmetric, midline tongue Motor: Generalized weakness, 4+/5 in all extremities  Sensory: Symmetric throughout to pin prick and light touch   Pertinent Labs: UDS: + for Benzos (was intubated prior to test by EMS) UA: Positive WBC: 11.2  LTM EEG 7/6 read: Suggestive of mild to moderate diffuse encephalopathy, nonspecific etiology. The excessive beta activity seen in the background is most likely due to the effect of benzodiazepine and is a benign EEG pattern. No seizures or epileptiform discharges were seen throughout the recording   MRI brain wwo 7/6 - IV infiltrated so no contrast was administered (noncon images were normal, personally reviewed by attending)  Assessment: 32 y.o. female  with PMH significant for Anxiety, Asthma, Depression, Miscarriage, and Seizures, noncompliance with AEDs, on Adderall who was found on [address] poorly responsive, intubated on scene. Seizure was suspected. She did not have side effects on Keppra in the past, but was felt that this could worsen her depression. She was alternatively started on Vimpat yesterday.  - This am, she had episode of  unconsciousness as described in HPI.  - It is difficult to ascertain the etiology of this event, but there was no witnessed tonic clonic activity and no real post ictal phase.   Recommendations: -episode of questionable pulselessness does not appear neurological in nature, but will obtain more data via long term EEG to ensure no occult seizure activity - Lacosamide 50mg  bid. This should be prescribed generic at discharge and coupon printed from [facility] to obtain it (usually $12/mo) since patient does not have insurance -continue treatment underlying depression/anxiety -b12 supplementation with goal >400 - Seizure Precautions -titration off of stimulants for ADHD, if possible, as can lower seizure threshold  - Pt counseled not to drive - Please arrange PCP that patient will be able to establish with without health insurance after discharge. Provide sufficient refills on lacosamide to bridge her to first appt -treatment UTI and transaminits per primary team  -Neurology to follow up LTM EEG: may increase Vimpat depending on results ; lamictal could alternatively be used should psychiatry think it would be beneficial for mood stabilization as well  -Neurology to follow    Sanjuana Letters, [place] [facility] Pager # [phone]   Electronically signed: Dr. Caryl Pina

## 2022-08-30 NOTE — Progress Notes (Signed)
   08/30/22 0743  Assess: MEWS Score  BP (!) 147/96  MAP (mmHg) 111  Pulse Rate (!) 115  ECG Heart Rate (!) 111  SpO2 100 %  Assess: MEWS Score  MEWS Temp 0  MEWS Systolic 0  MEWS Pulse 2  MEWS RR 0  MEWS LOC 0  MEWS Score 2  MEWS Score Color Yellow  Assess: if the MEWS score is Yellow or Red  Were vital signs taken at a resting state? Yes  Focused Assessment No change from prior assessment  Does the patient meet 2 or more of the SIRS criteria? No  MEWS guidelines implemented  Yes, yellow  Treat  MEWS Interventions Considered administering scheduled or prn medications/treatments as ordered  Take Vital Signs  Increase Vital Sign Frequency  Yellow: Q2hr x1, continue Q4hrs until patient remains green for 12hrs  Escalate  MEWS: Escalate Yellow: Discuss with charge nurse and consider notifying provider and/or RRT  Notify: Charge Nurse/RN  Name of Charge Nurse/RN Notified LaurenRN  Provider Notification  Provider Name/Title DR. Osvaldo Shipper  Date Provider Notified 08/30/22  Time Provider Notified [phone]  Method of Notification Page  Notification Reason Other (Comment) (ST in monitor)  Provider response At bedside  Date of Provider Response 08/30/22  Time of Provider Response 0810  Assess: SIRS CRITERIA  SIRS Temperature  0  SIRS Pulse 1  SIRS Respirations  0  SIRS WBC 0  SIRS Score Sum  1   This writer has been informed that pt's has elevated HR in 110s, Pt seen in room alert/oriented in no apparent distress. She c/o panic attack, PRN meds given . Attending MD made aware of above with orders to follow. Pt in bed eating her meals comfortably , closely monitored

## 2022-08-30 NOTE — Progress Notes (Signed)
It was noticed on the tele monitor that the pts O2 stat was in the 40's on RA. Nasal cannula was placed on 6L and O2 went back up to 100%. Pt was lethargic but able to verbally arouse, pt would fall asleep in the middle of talking.  MD was notified and Narcan 0.4 mg was ordered and given. Pt became alert and mentation was back to baseline. MD requested security search the pts belongings. Security was called room was searched  and  pts belongings taken to be locked in security office.  Patient aware of procedure.  About 10 minutes later Pt called out to speak with nurse. Pt states she wants to leave AMA. MD on unit was notified, Neurologist on call was notified. Explained to patient risks of leaving AMA.  Patient verbalized understanding.  IV was removed by RN.  Security called and escorted patient and her belongings outside of facility.

## 2022-08-30 NOTE — Progress Notes (Signed)
LTM EEG running - no initial skin breakdown - push button tested - neuro notified. 

## 2022-08-30 NOTE — Significant Event (Signed)
Rapid Response Event Note   Late Entry:  Responded to Code Blue, entered room as ROSC achieved--verified pulse at fem. Per report compressions after finding patient unresponsive/pulseless. Patient remains assisted via BVM d/t agonal breathing, remains unresponsive. Narcan 0.4mg  IV given 1124 without response. 1128 decision to intubate, patient begins to arouse slowly and within following commands progressing to answering questions.   Patient increasingly becoming more aware stating "I want to go home. Please don't send me to the ICU. I don't want any more tests." Explained to patient event occurrence, decision to transfer to PCU for closer monitoring and likely continuous EEG monitoring. Review of cardiac monitoring shows ST prior to event. Clothing on patient at this time and belongings searched during event by staff. No reported visitors today.   157/102 (117)  HR 84 O2 99% BVM  Truddie Crumble, RN

## 2022-08-30 NOTE — Code Documentation (Signed)
CODE BLUE NOTE  Patient Name: Stephanie Yu   MRN: [id number]   Date of Birth/ Sex: 1990-03-18 , female      Admission Date: 08/27/2022  Attending Provider: Osvaldo Shipper, MD  Primary Diagnosis: Acute encephalopathy    Indication: Pt was in her usual state of health until this morning, when she was noted to be non-responsive and pulseless by nursing staff. Code blue was subsequently called. At the time of arrival on scene, ACLS protocol was underway.  Patient had received 2 min of compressions with ROSC. At the time of arrival, she was being ventilated via BVM.   Patient subsequently received 0.4mg  of Narcan given initial hospital presentation of presumed accidental overdose. Patient began breathing spontaneously within 2-30min of Narcan administration. Did not require intubation.   Technical Description:  - CPR performance duration:  2 minutes  - Was defibrillation or cardioversion used? No   - Was external pacer placed? No  - Was patient intubated pre/post CPR? No    Medications Administered: Y = Yes; Blank = No Amiodarone    Atropine    Calcium    Epinephrine    Lidocaine    Magnesium    Norepinephrine    Phenylephrine    Sodium bicarbonate    Vasopressin     Narcan 0.4mg  x1  Post CPR evaluation:  - Final Status - Was patient successfully resuscitated ? Yes - What is current rhythm? Sinus tachycardia - What is current hemodynamic status? BPs normotensive    Miscellaneous Information:  - Labs sent, including: CBC CMP  - Primary team notified?  Yes  - Family Notified? No  - Additional notes/ transfer status: Patient to be transferred to [facility] ICU.         Alicia Amel, MD  08/30/2022, 11:39 AM

## 2022-08-30 NOTE — Progress Notes (Signed)
Left a message via voicemail to [facility], awaiting a return call.

## 2022-08-30 NOTE — Progress Notes (Signed)
Patient left AMA.

## 2022-08-30 NOTE — Progress Notes (Signed)
NAMEBonny Yu, MRN:  [id number], DOB:  [birth year], LOS: 3 ADMISSION DATE:  08/27/2022, CONSULTATION DATE:  08/27/22 REFERRING MD:  Dr. Lockie Mola , CHIEF COMPLAINT:  Acute encephalopathy   Brief History   32 yo female found on the side of the road with altered mental status and agonal breathing.  Police found a bag with white substance in her belongings.  Presumed to have accidental overdose, and possible seizure.  Intubated for airway protection and PCCM asked to admit to ICU.  Past Medical History  Allergies, Anxiety, Asthma, Depression, Seizures  Significant Events  7/05 Admit, Intubated, neuro consulted 7/06 extubated, d/c LTM 7/07 transfer to med-tele  Interim history/subjective:   Patient transferred out of ICU yesterday Called to bedside for code blue  Patient lost consciousness and stopped breathing, no pulse was noted so compressions were initiated but patient regained consciousness soon after. Telemetry review shows no loss of pulse. No reported visitors this morning. No seizure activity witnessed.  Patient awake and complaining about wanting to go home. She does not wish to stay in the in the hospital.   Objective   Blood pressure 111/82, pulse (!) 106, temperature 97.8 F (36.6 C), resp. rate 15, height 5' 2.99" (1.6 m), weight 62.3 kg, SpO2 100 %, unknown if currently breastfeeding.       No intake or output data in the 24 hours ending 08/30/22 1158  Filed Weights   08/28/22 0039 08/28/22 0313 08/29/22 0500  Weight: 61.6 kg 61.6 kg 62.3 kg    Examination:  General - alert Eyes - pupils reactive Cardiac - tacycardia, no murmur Chest - equal breath sounds b/l, no wheezing or rales Abdomen - soft, non tender, + bowel sounds Extremities - no cyanosis, clubbing, or edema Skin - no rashes Neuro - normal strength, moves extremities, follows commands Psych - anxious  Assessment & Plan:   Loss of consciousness 7/8 - re-consult neuro for evaluation of  seizures - consider CTA Chest scan to rule out PE - consider cardiology consult and echo to rule out cardiac etiology. Less concerning based on telemetry review - check urine drug screen - Other differentials include panic attack and anxiety vs narcolepsy  Acute toxic and metabolic encephalopathy from accidental overdose and UTI. Hx of seizures with report of medication non-compliance. Hx of ADHD, anxiety and depression. - MRI brain 7/7 unremarkable, unable to perform with adequate contrast enhancement - continue vimpat - prozac and prn xanax   UTI - completed ceftriaxone on 7/07  Anemia. - check iron level - f/u CBC   Hx of asthma. - prn albuterol  Patient to be transferred to progressive care unit. Please call CCM if needed, we will sign off.  Labs       Latest Ref Rng & Units 08/29/2022    2:32 AM 08/28/2022    5:19 AM 08/28/2022    1:37 AM  CMP  Glucose 70 - 99 mg/dL 94   99   BUN 6 - 20 mg/dL 10   12   Creatinine 4.09 - 1.00 mg/dL 8.11   9.14   Sodium 782 - 145 mmol/L 143  143  141   Potassium 3.5 - 5.1 mmol/L 3.8  4.0  4.3   Chloride 98 - 111 mmol/L 102   105   CO2 22 - 32 mmol/L 28   25   Calcium 8.9 - 10.3 mg/dL 8.7   8.5       Latest Ref Rng & Units 08/30/2022  4:15 AM 08/29/2022    2:32 AM 08/28/2022    5:19 AM  CBC  WBC 4.0 - 10.5 K/uL 7.1  7.9    Hemoglobin 12.0 - 15.0 g/dL 16.1  09.6  04.5   Hematocrit 36.0 - 46.0 % 39.3  32.8  33.0   Platelets 150 - 400 K/uL 261  222      ABG    Component Value Date/Time   PHART 7.344 (L) 08/28/2022 0519   PCO2ART 52.9 (H) 08/28/2022 0519   PO2ART 187 (H) 08/28/2022 0519   HCO3 28.8 (H) 08/28/2022 0519   TCO2 30 08/28/2022 0519   ACIDBASEDEF 3.0 (H) 08/27/2022 2153   O2SAT 100 08/28/2022 0519    CBG (last 3)  Recent Labs    08/28/22 2314 08/29/22 0327 08/29/22 0731  GLUCAP 99 91 101*   Signature:   Melody Comas, MD [facility] Office: [phone]   See Amion for personal  pager PCCM on call pager [phone] until 7pm. Please call Elink 7p-7a. [phone]

## 2022-08-30 NOTE — Significant Event (Addendum)
[facility] night coverage note:  Called by RN: Pt with apnea, desating down into the 40s, lethargic / unresponsive around time of shift change.  Up to 90 on max O2 via Friend.  I ordered and pt given one dose of narcan 0.4mg  due to concern for possible opiate OD given recent history (see multiple provider notes, report by police of finding white powdery substance in belongings PTA, apparent response to narcan during code earlier in day as per resident note, etc), noted that UDS positive only for benzos, however several opiate agonists (ie: methadone, fentanyl) would not necessarily show up on our UDS.  Regardless, this appears to have worked, specifically the patient immediately (within about 1 min per RN report) became more responsive, awake, alert, and had normal O2 sats.  This rapid and complete response to narcan (no other medications given) further raised the concern for opiate OD.  Security is searching her room and belongings.  Shortly thereafter, I am informed by RN that pt wishes to leave AMA.

## 2022-08-30 NOTE — Progress Notes (Addendum)
[facility] PROGRESS NOTE   Stephanie Yu GLO:[id number] DOB: [birth year] DOA: 08/27/2022  PCP: Leilani Able, MD  Brief History/Interval Summary: This is a 32 year old female with past medical history of anxiety disorder, depression, seizure disorder who was found on the side of her room with altered mental status and agonal breathing.  There was concern for seizure activity.  She was intubated and then brought to the hospital.  Admitted to the ICU.  She was stabilized and then transferred to the floor.  Consultants: Critical care medicine.  Neurology.  Procedures: EEG.      Subjective/Interval History: Patient noted to be very anxious this morning. Expressing wish to go home.  She denies any chest pain shortness of breath.  Has ambulated within the room without any difficulty.    Assessment/Plan:  ADDENDUM Around 11:16AM or so patient was found to be unresponsive by nursing staff.  They could not feel a pulse.  They started CPR.  A CODE BLUE was called.  Patient was noted to be obtunded.  She was Ambu bagged.  She was about to be intubated when she is started opening her eyes.  Telemetry was reviewed.  No clear arrhythmia noted.  No episodes of bradycardia or asystole noted.  She seems to be back to baseline currently.  Discussed with critical care team who was at bedside.  Will do a CT angiogram chest to rule out PE.  Repeat urine drug screen.  Complete metabolic panel.  EKG and echocardiogram will be ordered.  Patient mentions that she wants to go home.  She is awake alert oriented x 3.  Able to answer questions appropriately.  She does have capacity at this time.  She was advised to stay in the hospital for now. Since she was unresponsive the seizure is also a possibility.  Discussed with neurology who will evaluate patient for continuous EEG.  She does not have any focal neurological deficits currently.  She has had 2 different MRIs.  No clear indication to do neuroimaging at this  time.  Acute toxic and metabolic encephalopathy Thought to be secondary to accidental overdose and perhaps a UTI.  She was intubated in the ICU.  Successfully extubated.  Transferred to the floor.  Mentation appears to have improved.  History of seizure with medication noncompliance There was concern for seizure activity at admission.  Seen by neurology.  Underwent EEG which did not show any epileptiform activity.  She has not tolerated Keppra well previously so she was started on lacosamide.  No seizure activity reported. MRI brain with contrast was attempted twice.  For some reason no contrast-enhancement was noted on either of the 2 MRIs.  No abnormalities noted otherwise.  History of anxiety disorder and depression and ADHD Adderall has been discontinued due to possible seizure.  Continue with Prozac and Xanax.  Sinus tachycardia Heart rate fluctuating between 110-130.  Noted to be sinus tachycardia.  Likely due to severe anxiety.  Patient reports a longstanding history of tachycardia. Will check thyroid function test.  Low-dose propranolol.  Urinary tract infection Completed antibiotic course.  Insignificant growth of E. coli noted on urine culture.  Normocytic anemia Anemia panel reviewed.  B12 deficiency noted.  Will start supplementation.  History of asthma Stable.  DVT Prophylaxis: Lovenox Code Status: Full code Family Communication: Discussed with patient Disposition Plan: Hopefully home later today     Medications: Scheduled:  Chlorhexidine Gluconate Cloth  6 each Topical Daily   enoxaparin (LOVENOX) injection  40 mg Subcutaneous  Q24H   feeding supplement  237 mL Oral BID BM   FLUoxetine  20 mg Oral Daily   lacosamide  50 mg Oral BID   mupirocin ointment  1 Application Nasal BID   propranolol  10 mg Oral BID   Continuous: NWG:[id number], albuterol, alprazolam, diphenhydrAMINE, hydrOXYzine, mouth rinse  Antibiotics: Anti-infectives (From admission, onward)     Start     Dose/Rate Route Frequency Ordered Stop   08/28/22 0000  cefTRIAXone (ROCEPHIN) 2 g in sodium chloride 0.9 % 100 mL IVPB        2 g 200 mL/hr over 30 Minutes Intravenous Daily 08/27/22 2345 08/29/22 2209       Objective:  Vital Signs  Vitals:   08/29/22 1436 08/30/22 0743 08/30/22 0945 08/30/22 1200  BP: (!) 122/90 (!) 147/96 111/82 120/87  Pulse:  (!) 115 (!) 106   Resp:      Temp: 98.2 F (36.8 C)  97.8 F (36.6 C) (!) 97.5 F (36.4 C)  TempSrc: Oral   Oral  SpO2: 100% 100%    Weight:      Height:       No intake or output data in the 24 hours ending 08/30/22 1219 Filed Weights   08/28/22 0039 08/28/22 0313 08/29/22 0500  Weight: 61.6 kg 61.6 kg 62.3 kg    General appearance: Awake alert.  In no distress Resp: Clear to auscultation bilaterally.  Normal effort Cardio: S1-S2 is tachycardic regular.  No S3-S4. GI: Abdomen is soft.  Nontender nondistended.  Bowel sounds are present normal.  No masses organomegaly Extremities: No edema.  Full range of motion of lower extremities. Neurologic: Alert and oriented x3.  No focal neurological deficits.    Lab Results:  Data Reviewed: I have personally reviewed following labs and reports of the imaging studies  CBC: Recent Labs  Lab 08/27/22 2150 08/27/22 2153 08/27/22 2352 08/28/22 0137 08/28/22 0519 08/29/22 0232 08/30/22 0415  WBC 11.2*  --   --  13.1*  --  7.9 7.1  NEUTROABS 7.3  --   --   --   --   --   --   HGB 13.0   < > 11.2* 11.4* 11.2* 10.8* 12.9  HCT 39.8   < > 33.0* 34.8* 33.0* 32.8* 39.3  MCV 92.1  --   --  91.3  --  90.1 90.6  PLT 329  --   --  236  --  222 261   < > = values in this interval not displayed.    Basic Metabolic Panel: Recent Labs  Lab 08/27/22 2150 08/27/22 2153 08/27/22 2352 08/28/22 0137 08/28/22 0519 08/29/22 0232  NA 140 143  144 142 141 143 143  K 4.0 4.1  4.1 4.7 4.3 4.0 3.8  CL 106 108  --  105  --  102  CO2 24  --   --  25  --  28  GLUCOSE 166* 162*   --  99  --  94  BUN 12 13  --  12  --  10  CREATININE 0.98 1.00  --  1.07*  --  0.72  CALCIUM 8.3*  --   --  8.5*  --  8.7*    GFR: Estimated Creatinine Clearance: 82 mL/min (by C-G formula based on SCr of 0.72 mg/dL).  Liver Function Tests: Recent Labs  Lab 08/27/22 2150  AST 23  ALT 20  ALKPHOS 53  BILITOT 0.9  PROT 7.2  ALBUMIN 4.0  Recent Labs  Lab 08/27/22 2150  AMMONIA 30     Cardiac Enzymes: Recent Labs  Lab 08/27/22 2150  CKTOTAL 44    CBG: Recent Labs  Lab 08/28/22 1523 08/28/22 1929 08/28/22 2314 08/29/22 0327 08/29/22 0731  GLUCAP 95 106* 99 91 101*    Lipid Profile: Recent Labs    08/28/22 0137  TRIG 88    Anemia Panel: Recent Labs    08/30/22 0415  VITAMINB12 210  FOLATE 12.7  FERRITIN 113  TIBC 293  IRON 66  RETICCTPCT 1.1    Recent Results (from the past 240 hour(s))  MRSA Next Gen by PCR, Nasal     Status: Abnormal   Collection Time: 08/28/22 12:41 AM   Specimen: Nasal Mucosa; Nasal Swab  Result Value Ref Range Status   MRSA by PCR Next Gen DETECTED (A) NOT DETECTED Final    Comment: RESULT CALLED TO, READ BACK BY AND VERIFIED WITH: S OWNEY,RN@0153  08/28/22 MK (NOTE) The GeneXpert MRSA Assay (FDA approved for NASAL specimens only), is one component of a comprehensive MRSA colonization surveillance program. It is not intended to diagnose MRSA infection nor to guide or monitor treatment for MRSA infections. Test performance is not FDA approved in patients less than 64 years old. Performed at [facility], [address] [place], [place] [address]   Urine Culture (for pregnant, neutropenic or urologic patients or patients with an indwelling urinary catheter)     Status: Abnormal   Collection Time: 08/28/22 12:42 AM   Specimen: Urine, Clean Catch  Result Value Ref Range Status   Specimen Description URINE, CLEAN CATCH  Final   Special Requests   Final    NONE Performed at [facility], [address] [place], [place] [address]    Culture 50,000 COLONIES/mL ESCHERICHIA COLI (A)  Final   Report Status 08/30/2022 FINAL  Final   Organism ID, Bacteria ESCHERICHIA COLI (A)  Final      Susceptibility   Escherichia coli - MIC*    AMPICILLIN >=32 RESISTANT Resistant     CEFAZOLIN <=4 SENSITIVE Sensitive     CEFEPIME <=0.12 SENSITIVE Sensitive     CEFTRIAXONE <=0.25 SENSITIVE Sensitive     CIPROFLOXACIN >=4 RESISTANT Resistant     GENTAMICIN <=1 SENSITIVE Sensitive     IMIPENEM <=0.25 SENSITIVE Sensitive     NITROFURANTOIN 32 SENSITIVE Sensitive     TRIMETH/SULFA <=20 SENSITIVE Sensitive     AMPICILLIN/SULBACTAM 4 SENSITIVE Sensitive     PIP/TAZO <=4 SENSITIVE Sensitive     * 50,000 COLONIES/mL ESCHERICHIA COLI      Radiology Studies: MR BRAIN W CONTRAST  Result Date: 08/29/2022 CLINICAL DATA:  Seizure. EXAM: MRI HEAD WITH CONTRAST TECHNIQUE: Multiplanar, multiecho pulse sequences of the brain and surrounding structures were obtained with intravenous contrast. CONTRAST:  6mL GADAVIST GADOBUTROL 1 MMOL/ML IV SOLN COMPARISON:  Brain MRI 1 day prior FINDINGS: Postcontrast imaging was attempted but no contrast is apparent on the provided images. Per the referring provider, the IV that was utilized is flushing normally. Noncontrast axial T1 and coronal T2 images were obtained. The brain is unremarkable on the provided sequences. There is mild mucosal thickening in the paranasal sinuses. IMPRESSION: 1. Postcontrast imaging was again attempted but no contrast is apparent on the provided images. Per the referring provider, the IV that was utilized is flushing normally. The patient will not be charged for this exam. 2. The brain is unremarkable on the provided sequences. Electronically Signed  By: Lesia Hausen M.D.   On: 08/29/2022 18:21       LOS: 3 days   Zulema Pulaski Rito Ehrlich  [facility] Pager on [email or web address]  08/30/2022, 12:19 PM

## 2022-08-30 NOTE — Progress Notes (Addendum)
This writer/NT found pt unresponsive/PEA,CPR initiated approximated @1116 , code blue called,  code team arrived.ACLS protocol undeway, And resuscitated pt back HR 80. ROSC achieved approximated @1122  Attending MD at bedside, and possible transfer to stepdown/ICU. Pt  alert/oriented in no acute distress. VSS.

## 2022-08-30 NOTE — Progress Notes (Signed)
Subjective: IV infiltrated when she went down to repeat MRI brain w contrast and no contrast images were obtained. She is not willing to try again.  Exam: Vitals:   08/29/22 1004 08/29/22 1436  BP: 118/85 (!) 122/90  Pulse: 98   Resp:    Temp:  98.2 F (36.8 C)  SpO2: 100% 100%   Gen: In bed, NAD Resp: non-labored breathing, no acute distress Abd: soft, nt  Neuro: MS: Alert and oriented to person, place, month, year.  does not recall coming into hospital WU:[id number], EOMI, visual fields full (glasses at baseline), facial movement and sensation bilaterally intact, hearing intact to voice, head turn and shoulder shrug symmetric, midline tongue Motor: Generalized weakness, 4+/5 in all extremities. Sensory: Symmetric throughout  Pertinent Labs: UDS: + for Benzos (was intubated prior to test) UA: Positive WBC: 11.2   LTM EEG 7/6 read: Suggestive of mild to moderate diffuse encephalopathy, nonspecific etiology. The excessive beta activity seen in the background is most likely due to the effect of benzodiazepine and is a benign EEG pattern. No seizures or epileptiform discharges were seen throughout the recording   MRI brain wwo 7/6 - IV infiltrated so no contrast was administered (noncon images were normal, personally reviewed by attending)  Impression: 32 y.o. female with PMH significant for Anxiety, Asthma, Depression, Miscarriage, and Seizures, noncompliance with AEDs, on Adderall who was found on [address] poorly responsive, intubated on scene. She did not have side effects on the keppra but I am concerned it will worsen her depression. She is amenable to starting vimpat.  Recommendations: - Seizure Precautions - Lacosamide 50mg  bid. This should be prescribed generic at discharge and coupon printed from [facility] to obtain it (usually $12/mo) since patient does not have insurance - Pt counseled not to drive - OK to discharge from neuro standpoint when medically ready - Please  arrange PCP that patient will be able to establish with without health insurance after discharge. Provide sufficient refills on lacosamide to bridge her to first appt  Neurology to sign off, please re-engage if additional neurologic concerns arise.  Bing Neighbors, MD [facility] [phone]  If 7pm- 7am, please page neurology on call as listed in AMION.'

## 2022-08-30 NOTE — Progress Notes (Addendum)
Report given to Patrice,RN. All questions and concerns were fully answered. Pt transferred to 2C06 as ordered.

## 2022-08-30 NOTE — Progress Notes (Signed)
  Echocardiogram 2D Echocardiogram has been performed.  [name] 08/30/2022, 2:23 PM

## 2022-08-30 NOTE — Progress Notes (Signed)
   08/30/22 1316  Spiritual Encounters  Type of Visit Initial  Conversation partners present during encounter Nurse  Referral source Code page  Reason for visit Code  OnCall Visit No   Chaplain responding to CODE Blue.  Arrived to find PT unavailable as medical team provided care.  There were not support persona present at the time.  Chaplain unable to visit with PT as intense medical care continued. Chaplain service remain available by paging Spiritual Care or by placing Spiritual Consult.

## 2022-08-31 ENCOUNTER — Other Ambulatory Visit (HOSPITAL_COMMUNITY): Payer: Self-pay

## 2022-08-31 NOTE — Discharge Summary (Signed)
[facility]  Physician Discharge Summary   Patient ID: Stephanie Yu MRN: [id number] DOB/AGE: 34-Feb-1992 34 y.o.  Admit date: 08/27/2022 Discharge date: 08/30/2022    PCP: Leilani Able, MD  DISCHARGE DIAGNOSES:    Acute encephalopathy UTI Seizure Disorder Anxiety and depression Normocytic anemia   PATIENT LEFT AGAINST MEDICAL ADVICE  INITIAL HISTORY:  This is a 32 year old female with past medical history of anxiety disorder, depression, seizure disorder who was found on the side of her room with altered mental status and agonal breathing.  There was concern for seizure activity.  She was intubated and then brought to the hospital.  Admitted to the ICU.  She was stabilized and then transferred to the floor.   Consultants: Critical care medicine.  Neurology.   Procedures: EEG.     HOSPITAL COURSE:   Unresponsiveness Around 11:16AM or so patient was found to be unresponsive by nursing staff.  They could not feel a pulse.  They started CPR.  A CODE BLUE was called.  Patient was noted to be obtunded.  She was Ambu bagged.  She was about to be intubated when she is started opening her eyes.  Telemetry was reviewed.  No clear arrhythmia noted.  No episodes of bradycardia or asystole noted.  CTA was negative for PE. ECHO showed normal EF and no WMA. UDS was reordered. EKG without acute findings. Since she was unresponsive the seizure is also a possibility.  Discussed with neurology who will evaluate patient for continuous EEG.  She does not have any focal neurological deficits currently.  She has had 2 different MRIs.  No clear indication to do neuroimaging at this time. Pateint subsequently had another episode. She was given narcan after which she woke up. She subsequently decided to leave AMA.   Acute toxic and metabolic encephalopathy Thought to be secondary to accidental overdose and perhaps a UTI.  She was intubated in the ICU.  Successfully extubated.     History of seizure  with medication noncompliance There was concern for seizure activity at admission.  Seen by neurology.  Underwent EEG which did not show any epileptiform activity.  She has not tolerated Keppra well previously so she was started on lacosamide.  No seizure activity reported. MRI brain with contrast was attempted twice.  For some reason no contrast-enhancement was noted on either of the 2 MRIs.  No abnormalities noted otherwise.   History of anxiety disorder and depression and ADHD Adderall has been discontinued due to possible seizure.  Continue with Prozac and Xanax.   Sinus tachycardia   Urinary tract infection Completed antibiotic course.  Insignificant growth of E. coli noted on urine culture.   Normocytic anemia Anemia panel reviewed.  B12 deficiency noted.  Started supplementation.   History of asthma Stable.      PERTINENT LABS:  The results of significant diagnostics from this hospitalization (including imaging, microbiology, ancillary and laboratory) are listed below for reference.    Microbiology: Recent Results (from the past 240 hour(s))  MRSA Next Gen by PCR, Nasal     Status: Abnormal   Collection Time: 08/28/22 12:41 AM   Specimen: Nasal Mucosa; Nasal Swab  Result Value Ref Range Status   MRSA by PCR Next Gen DETECTED (A) NOT DETECTED Final    Comment: RESULT CALLED TO, READ BACK BY AND VERIFIED WITH: S OWNEY,RN@[id number]  08/28/22 MK (NOTE) The GeneXpert MRSA Assay (FDA approved for NASAL specimens only), is one component of a comprehensive MRSA colonization surveillance program. It is not  intended to diagnose MRSA infection nor to guide or monitor treatment for MRSA infections. Test performance is not FDA approved in patients less than 31 years old. Performed at [facility], [address] [place], [place] [address]   Urine Culture (for pregnant, neutropenic or urologic patients or patients with an indwelling urinary catheter)     Status: Abnormal    Collection Time: 08/28/22 12:42 AM   Specimen: Urine, Clean Catch  Result Value Ref Range Status   Specimen Description URINE, CLEAN CATCH  Final   Special Requests   Final    NONE Performed at [facility], [address], [place], [place] [address]    Culture 50,000 COLONIES/mL ESCHERICHIA COLI (A)  Final   Report Status 08/30/2022 FINAL  Final   Organism ID, Bacteria ESCHERICHIA COLI (A)  Final      Susceptibility   Escherichia coli - MIC*    AMPICILLIN >=32 RESISTANT Resistant     CEFAZOLIN <=4 SENSITIVE Sensitive     CEFEPIME <=0.12 SENSITIVE Sensitive     CEFTRIAXONE <=0.25 SENSITIVE Sensitive     CIPROFLOXACIN >=4 RESISTANT Resistant     GENTAMICIN <=1 SENSITIVE Sensitive     IMIPENEM <=0.25 SENSITIVE Sensitive     NITROFURANTOIN 32 SENSITIVE Sensitive     TRIMETH/SULFA <=20 SENSITIVE Sensitive     AMPICILLIN/SULBACTAM 4 SENSITIVE Sensitive     PIP/TAZO <=4 SENSITIVE Sensitive     * 50,000 COLONIES/mL ESCHERICHIA COLI     Labs:   Basic Metabolic Panel: Recent Labs  Lab 08/27/22 2150 08/27/22 2153 08/27/22 2352 08/28/22 0137 08/28/22 0519 08/29/22 0232 08/30/22 1140  NA 140 143  144 142 141 143 143 139  K 4.0 4.1  4.1 4.7 4.3 4.0 3.8 3.9  CL 106 108  --  105  --  102 104  CO2 24  --   --  25  --  28 25  GLUCOSE 166* 162*  --  99  --  94 203*  BUN 12 13  --  12  --  10 16  CREATININE 0.98 1.00  --  1.07*  --  0.72 0.93  CALCIUM 8.3*  --   --  8.5*  --  8.7* 8.5*   Liver Function Tests: Recent Labs  Lab 08/27/22 2150 08/30/22 1140  AST 23 151*  ALT 20 152*  ALKPHOS 53 103  BILITOT 0.9 0.4  PROT 7.2 6.7  ALBUMIN 4.0 3.7    Recent Labs  Lab 08/27/22 2150  AMMONIA 30   CBC: Recent Labs  Lab 08/27/22 2150 08/27/22 2153 08/28/22 0137 08/28/22 0519 08/29/22 0232 08/30/22 0415 08/30/22 1140  WBC 11.2*  --  13.1*  --  7.9 7.1 7.6  NEUTROABS 7.3  --   --   --   --   --   --   HGB 13.0   < > 11.4* 11.2* 10.8* 12.9 13.6  HCT 39.8   < >  34.8* 33.0* 32.8* 39.3 40.2  MCV 92.1  --  91.3  --  90.1 90.6 92.6  PLT 329  --  236  --  222 261 283   < > = values in this interval not displayed.   Cardiac Enzymes: Recent Labs  Lab 08/27/22 2150  CKTOTAL 44   CBG: Recent Labs  Lab 08/28/22 1523 08/28/22 1929 08/28/22 2314 08/29/22 0327 08/29/22 0731  GLUCAP 95 106* 99 91 101*     IMAGING STUDIES ECHOCARDIOGRAM COMPLETE  Result Date: 08/30/2022  ECHOCARDIOGRAM REPORT   Patient Name:   KIMRA KANTOR Date of Exam: 08/30/2022 Medical Rec #:  [id number]     Height:       63.0 in Accession #:    [id number]    Weight:       137.3 lb Date of Birth:  01-11-1991     BSA:          1.648 m Patient Age:    32 years      BP:           120/87 mmHg Patient Gender: F             HR:           95 bpm. Exam Location:  Inpatient Procedure: 2D Echo, Cardiac Doppler and Color Doppler STAT ECHO Indications:    cardiac arrest  History:        Patient has no prior history of Echocardiogram examinations.                 Risk Factors:Former Smoker.  Sonographer:    [name] RDCS Referring Phys: [id number] Osvaldo Shipper IMPRESSIONS  1. Left ventricular ejection fraction, by estimation, is 60 to 65%. The left ventricle has normal function. The left ventricle has no regional wall motion abnormalities. Left ventricular diastolic parameters were normal.  2. Right ventricular systolic function is normal. The right ventricular size is normal.  3. The mitral valve is normal in structure. No evidence of mitral valve regurgitation. No evidence of mitral stenosis.  4. The aortic valve is normal in structure. Aortic valve regurgitation is not visualized. No aortic stenosis is present.  5. The inferior vena cava is normal in size with greater than 50% respiratory variability, suggesting right atrial pressure of 3 mmHg. FINDINGS  Left Ventricle: Left ventricular ejection fraction, by estimation, is 60 to 65%. The left ventricle has normal function. The left ventricle has no  regional wall motion abnormalities. The left ventricular internal cavity size was normal in size. There is  no left ventricular hypertrophy. Left ventricular diastolic parameters were normal. Right Ventricle: The right ventricular size is normal. No increase in right ventricular wall thickness. Right ventricular systolic function is normal. Left Atrium: Left atrial size was normal in size. Right Atrium: Right atrial size was normal in size. Pericardium: There is no evidence of pericardial effusion. Mitral Valve: The mitral valve is normal in structure. No evidence of mitral valve regurgitation. No evidence of mitral valve stenosis. Tricuspid Valve: The tricuspid valve is normal in structure. Tricuspid valve regurgitation is trivial. No evidence of tricuspid stenosis. Aortic Valve: The aortic valve is normal in structure. Aortic valve regurgitation is not visualized. No aortic stenosis is present. Pulmonic Valve: The pulmonic valve was normal in structure. Pulmonic valve regurgitation is not visualized. No evidence of pulmonic stenosis. Aorta: The aortic root is normal in size and structure. Venous: The inferior vena cava is normal in size with greater than 50% respiratory variability, suggesting right atrial pressure of 3 mmHg. IAS/Shunts: No atrial level shunt detected by color flow Doppler.  LEFT VENTRICLE PLAX 2D LVIDd:         4.20 cm   Diastology LVIDs:         2.80 cm   LV e' medial:    0.09 cm/s LV PW:         0.80 cm   LV E/e' medial:  8.5 LV IVS:        0.70 cm   LV e'  lateral:   0.14 cm/s LVOT diam:     2.10 cm   LV E/e' lateral: 5.6 LV SV:         73 LV SV Index:   44 LVOT Area:     3.46 cm  RIGHT VENTRICLE             IVC RV Basal diam:  1.90 cm     IVC diam: 1.40 cm RV S prime:     13.40 cm/s TAPSE (M-mode): 2.0 cm LEFT ATRIUM             Index        RIGHT ATRIUM          Index LA diam:        3.20 cm 1.94 cm/m   RA Area:     9.07 cm LA Vol (A2C):   23.1 ml 14.02 ml/m  RA Volume:   16.70 ml 10.13  ml/m LA Vol (A4C):   27.7 ml 16.81 ml/m LA Biplane Vol: 26.5 ml 16.08 ml/m  AORTIC VALVE LVOT Vmax:   137.00 cm/s LVOT Vmean:  86.200 cm/s LVOT VTI:    0.211 m  AORTA Ao Root diam: 3.00 cm Ao Asc diam:  2.90 cm MITRAL VALVE MV Area (PHT): 4.06 cm    SHUNTS MV Decel Time: 187 msec    Systemic VTI:  0.21 m MV E velocity: 0.79 cm/s   Systemic Diam: 2.10 cm MV A velocity: 60.50 cm/s MV E/A ratio:  0.01 Arvilla Meres MD Electronically signed by Arvilla Meres MD Signature Date/Time: 08/30/2022/3:01:43 PM    Final    CT Angio Chest Pulmonary Embolism (PE) W or WO Contrast  Result Date: 08/30/2022 CLINICAL DATA:  Syncope/presyncope EXAM: CT ANGIOGRAPHY CHEST WITH CONTRAST TECHNIQUE: Multidetector CT imaging of the chest was performed using the standard protocol during bolus administration of intravenous contrast. Multiplanar CT image reconstructions and MIPs were obtained to evaluate the vascular anatomy. RADIATION DOSE REDUCTION: This exam was performed according to the departmental dose-optimization program which includes automated exposure control, adjustment of the mA and/or kV according to patient size and/or use of iterative reconstruction technique. CONTRAST:  75mL OMNIPAQUE IOHEXOL 350 MG/ML SOLN COMPARISON:  Chest x-ray August 27, 2022 FINDINGS: Cardiovascular: Satisfactory opacification of the pulmonary arteries to the segmental level. No evidence of pulmonary embolism. Normal heart size. No pericardial effusion. Mediastinum/Nodes: Prominent hilar and mediastinal lymph nodes. An index right paratracheal lymph node measures up to 10 mm in short axis (series 8, image 98). An index left hilar lymph node measures up to 15 mm in short axis (series 8, image 152). No axillary lymphadenopathy. Thyroid gland, trachea, and esophagus demonstrate no significant findings. Lungs/Pleura: Nonocclusive layering filling defects within the trachea, likely layering secretions. However, there is a more prominent nodular soft  tissue filling defect in the right mainstem bronchus it measures up to 10 mm (series 7, image 39). Otherwise, patent central airways. Minimal dependent atelectasis. Otherwise, no focal pulmonary opacity. No pleural effusion or pneumothorax. Upper Abdomen: No acute abnormality. Musculoskeletal: No chest wall abnormality. No acute or significant osseous findings. Review of the MIP images confirms the above findings. IMPRESSION: 1. No acute pulmonary embolism. 2. Nodular soft tissue filling defect in the right mainstem bronchus, measuring up to 10 mm. While this may represent layering secretions, recommend repeat chest CT in 3 months to re-evaluate. 3. Mild hilar and mediastinal lymphadenopathy, possibly reactive. Recommend attention on repeat chest CT in 3 months. Electronically Signed   By:  Jacob Moores M.D.   On: 08/30/2022 13:51   MR BRAIN W WO CONTRAST  Addendum Date: 08/30/2022   ADDENDUM REPORT: 08/30/2022 09:00 ADDENDUM: The patient was brought back and given an additional 6 cc Gadavist on 08/29/2022 at approximately 4 am, but again no contrast was apparent on the images. Per the referring provider, the IV that was utilized is flushing normally. Noncontrast axial T1 and coronal T2 images were obtained. The brain is unremarkable on the provided sequences. Electronically Signed   By: Lesia Hausen M.D.   On: 08/30/2022 09:00   Result Date: 08/30/2022 CLINICAL DATA:  Altered mental status EXAM: MRI HEAD WITHOUT AND WITH CONTRAST TECHNIQUE: Multiplanar, multiecho pulse sequences of the brain and surrounding structures were obtained without and with intravenous contrast. CONTRAST:  6mL GADAVIST GADOBUTROL 1 MMOL/ML IV SOLN COMPARISON:  CT head 1 day prior FINDINGS: 6 cc Gadavist was injected; however, contrast is not present on the postcontrast images. Brain: There is no evidence of acute intracranial hemorrhage, extra-axial fluid collection, or acute infarct Parenchymal volume is normal. The ventricles are  normal in size. Parenchymal signal is normal. The pituitary and suprasellar region are normal. There is no mass lesion. There is no mass effect or midline shift. Vascular: Normal flow voids. Skull and upper cervical spine: Normal marrow signal. Sinuses/Orbits: The paranasal sinuses are clear. The globes and orbits are unremarkable. Other: There is trace fluid in the mastoid air cells, nonspecific. IMPRESSION: 1. 6 cc Gadavist was injected; however, contrast is not present on the postcontrast images. Per the technologist, the patient experienced no discomfort or swelling of the IV site at the time of contrast administration. Recommend monitoring the IV site while the patient is inpatient. 2. Normal noncontrast brain MRI with no acute intracranial pathology. Repeat postcontrast imaging may be considered as indicated. Electronically Signed: By: Lesia Hausen M.D. On: 08/28/2022 13:30   MR BRAIN W CONTRAST  Result Date: 08/29/2022 CLINICAL DATA:  Seizure. EXAM: MRI HEAD WITH CONTRAST TECHNIQUE: Multiplanar, multiecho pulse sequences of the brain and surrounding structures were obtained with intravenous contrast. CONTRAST:  6mL GADAVIST GADOBUTROL 1 MMOL/ML IV SOLN COMPARISON:  Brain MRI 1 day prior FINDINGS: Postcontrast imaging was attempted but no contrast is apparent on the provided images. Per the referring provider, the IV that was utilized is flushing normally. Noncontrast axial T1 and coronal T2 images were obtained. The brain is unremarkable on the provided sequences. There is mild mucosal thickening in the paranasal sinuses. IMPRESSION: 1. Postcontrast imaging was again attempted but no contrast is apparent on the provided images. Per the referring provider, the IV that was utilized is flushing normally. The patient will not be charged for this exam. 2. The brain is unremarkable on the provided sequences. Electronically Signed   By: Lesia Hausen M.D.   On: 08/29/2022 18:21   Overnight EEG with  video  Result Date: 08/28/2022 Charlsie Quest, MD     08/28/2022  9:17 PM Patient Name: Cristal Qadir MRN: [id number] Epilepsy Attending: Charlsie Quest Referring Physician/Provider: Erick Blinks, MD Duration: 08/28/2022 0101 to 1515 Patient history: 32 y.o. female with PMH significant for Anxiety, Asthma, Depression, Miscarriage, and Seizures who was found poorly responsive on the road and intubated in the field. Hx of seizures but likely not been filling her Keppra and also seems to be taking adderall. EEG to evaluate for seizure. Level of alertness: Awake, asleep AEDs during EEG study: LEV, versed, propofol Technical aspects: This EEG study was done with  scalp electrodes positioned according to the 10-20 International system of electrode placement. Electrical activity was reviewed with band pass filter of 1-70Hz , sensitivity of 7 uV/mm, display speed of 32mm/sec with a 60Hz  notched filter applied as appropriate. EEG data were recorded continuously and digitally stored.  Video monitoring was available and reviewed as appropriate. Description: The posterior dominant rhythm consists of 8-9 Hz activity of moderate voltage (25-35 uV) seen predominantly in posterior head regions, symmetric and reactive to eye opening and eye closing. Drowsiness was characterized by attenuation of the posterior background rhythm. Sleep was characterized by vertex waves, sleep spindles (12 to 14 Hz), maximal frontocentral region. EEG showed intermittent generalized polymorphic sharply contoured 3 to 6 Hz theta-delta slowing. There is an excessive amount of 15 to 18 Hz beta activity distributed symmetrically and diffusely. Hyperventilation and photic stimulation were not performed.   ABNORMALITY - Intermittent slow, generalized - Excessive beta, generalized IMPRESSION: This study is suggestive of mild to moderate diffuse encephalopathy, nonspecific etiology. The excessive beta activity seen in the background is most likely due to  the effect of benzodiazepine and is a benign EEG pattern. No seizures or epileptiform discharges were seen throughout the recording. Charlsie Quest   DG Abdomen 1 View  Result Date: 08/27/2022 CLINICAL DATA:  NG tube placement. EXAM: ABDOMEN - 1 VIEW COMPARISON:  None Available. FINDINGS: Tip and side port of the enteric tube below the diaphragm in the stomach. Normal bowel gas pattern. Small to moderate volume of stool in the colon. IMPRESSION: Tip and side port of the enteric tube below the diaphragm in the stomach. Electronically Signed   By: Narda Rutherford M.D.   On: 08/27/2022 23:23   CT Head Wo Contrast  Result Date: 08/27/2022 CLINICAL DATA:  Altered mental status and possible fall, initial encounter EXAM: CT HEAD WITHOUT CONTRAST CT CERVICAL SPINE WITHOUT CONTRAST TECHNIQUE: Multidetector CT imaging of the head and cervical spine was performed following the standard protocol without intravenous contrast. Multiplanar CT image reconstructions of the cervical spine were also generated. RADIATION DOSE REDUCTION: This exam was performed according to the departmental dose-optimization program which includes automated exposure control, adjustment of the mA and/or kV according to patient size and/or use of iterative reconstruction technique. COMPARISON:  None Available. FINDINGS: CT HEAD FINDINGS Brain: No evidence of acute infarction, hemorrhage, hydrocephalus, extra-axial collection or mass lesion/mass effect. Vascular: No hyperdense vessel or unexpected calcification. Skull: Normal. Negative for fracture or focal lesion. Sinuses/Orbits: No acute finding. Other: None. CT CERVICAL SPINE FINDINGS Alignment: Within normal limits. Skull base and vertebrae: 7 cervical segments are well visualized. Vertebral body height is well maintained. No acute fracture or acute facet abnormality is noted. Soft tissues and spinal canal: Surrounding soft tissue structures are within normal limits. Endotracheal tube is noted  in satisfactory position. Upper chest: Lung apices are within normal limits. Other: None IMPRESSION: CT of the head: No acute abnormality noted. CT of the cervical spine: No acute abnormality noted. Electronically Signed   By: Alcide Clever M.D.   On: 08/27/2022 22:21   CT Cervical Spine Wo Contrast  Result Date: 08/27/2022 CLINICAL DATA:  Altered mental status and possible fall, initial encounter EXAM: CT HEAD WITHOUT CONTRAST CT CERVICAL SPINE WITHOUT CONTRAST TECHNIQUE: Multidetector CT imaging of the head and cervical spine was performed following the standard protocol without intravenous contrast. Multiplanar CT image reconstructions of the cervical spine were also generated. RADIATION DOSE REDUCTION: This exam was performed according to the departmental dose-optimization program which includes  automated exposure control, adjustment of the mA and/or kV according to patient size and/or use of iterative reconstruction technique. COMPARISON:  None Available. FINDINGS: CT HEAD FINDINGS Brain: No evidence of acute infarction, hemorrhage, hydrocephalus, extra-axial collection or mass lesion/mass effect. Vascular: No hyperdense vessel or unexpected calcification. Skull: Normal. Negative for fracture or focal lesion. Sinuses/Orbits: No acute finding. Other: None. CT CERVICAL SPINE FINDINGS Alignment: Within normal limits. Skull base and vertebrae: 7 cervical segments are well visualized. Vertebral body height is well maintained. No acute fracture or acute facet abnormality is noted. Soft tissues and spinal canal: Surrounding soft tissue structures are within normal limits. Endotracheal tube is noted in satisfactory position. Upper chest: Lung apices are within normal limits. Other: None IMPRESSION: CT of the head: No acute abnormality noted. CT of the cervical spine: No acute abnormality noted. Electronically Signed   By: Alcide Clever M.D.   On: 08/27/2022 22:21   DG Chest Portable 1 View  Result Date:  08/27/2022 CLINICAL DATA:  Altered mental status EXAM: PORTABLE CHEST 1 VIEW COMPARISON:  Chest x-ray 01/23/2014 FINDINGS: Endotracheal tube tip is 3 cm above the carina. The heart size and mediastinal contours are within normal limits. Both lungs are clear. The visualized skeletal structures are unremarkable. IMPRESSION: No active disease. Endotracheal tube tip is 3 cm above the carina. Electronically Signed   By: Darliss Cheney M.D.   On: 08/27/2022 21:53     PATIENT LEFT AGAINST MEDICAL ADVICE   Osvaldo Shipper  [facility] Pager on [email or web address]  08/31/2022, 6:12 AM

## 2022-08-31 NOTE — Procedures (Signed)
Patient Name: Samantha Atkeson  MRN: [id number]  Epilepsy Attending: Charlsie Quest  Referring Physician/Provider: Caryl Pina, MD  Duration: 7/8/20254 1555 to 1925  Patient history: 32 y.o. female with PMH significant for Anxiety, Asthma, Depression, Miscarriage, and Seizures, noncompliance with AEDs, on Adderall who was found on [address] poorly responsive, intubated on scene. Seizure was suspected. EEG to evaluate for seizure  Level of alertness: Awake, asleep  AEDs during EEG study: LCM  Technical aspects: This EEG study was done with scalp electrodes positioned according to the 10-20 International system of electrode placement. Electrical activity was reviewed with band pass filter of 1-70Hz , sensitivity of 7 uV/mm, display speed of 89mm/sec with a 60Hz  notched filter applied as appropriate. EEG data were recorded continuously and digitally stored.  Video monitoring was available and reviewed as appropriate.  Description: No clear posterior dominant rhythm was seen.  Sleep was characterized by vertex waves, sleep symptoms (12 to 14 Hz), maximal frontocentral region.  There is an excessive amount of 15 to 18 Hz beta activity distributed symmetrically and diffusely. Hyperventilation and photic stimulation were not performed.     ABNORMALITY - Excessive beta, generalized  IMPRESSION: This study is within normal limits. The excessive beta activity seen in the background is most likely due to the effect of benzodiazepine and is a benign EEG pattern. No seizures or epileptiform discharges were seen throughout the recording.  A normal interictal EEG does not exclude the diagnosis of epilepsy.   Tyrianna Lightle Annabelle Harman

## 2022-09-09 ENCOUNTER — Other Ambulatory Visit (HOSPITAL_COMMUNITY): Payer: Self-pay

## 2022-09-13 ENCOUNTER — Other Ambulatory Visit (HOSPITAL_COMMUNITY): Payer: Self-pay

## 2022-09-14 ENCOUNTER — Other Ambulatory Visit (HOSPITAL_COMMUNITY): Payer: Self-pay
